# Patient Record
Sex: Female | Born: 1955 | Race: Black or African American | Hispanic: No | Smoking: Never smoker
Health system: Southern US, Community
[De-identification: ages and names within clinical notes are randomized; demographics above are authoritative.]

## PROBLEM LIST (undated history)

## (undated) DIAGNOSIS — I10 Essential (primary) hypertension: Secondary | ICD-10-CM

## (undated) DIAGNOSIS — R351 Nocturia: Secondary | ICD-10-CM

## (undated) DIAGNOSIS — K5900 Constipation, unspecified: Secondary | ICD-10-CM

## (undated) DIAGNOSIS — M1990 Unspecified osteoarthritis, unspecified site: Secondary | ICD-10-CM

## (undated) DIAGNOSIS — J45909 Unspecified asthma, uncomplicated: Secondary | ICD-10-CM

---

## 1999-10-26 ENCOUNTER — Encounter: Payer: Self-pay | Admitting: Neurosurgery

## 1999-10-28 ENCOUNTER — Observation Stay (HOSPITAL_COMMUNITY): Admission: RE | Admit: 1999-10-28 | Discharge: 1999-10-29 | Payer: Self-pay | Admitting: Neurosurgery

## 1999-10-28 ENCOUNTER — Encounter: Payer: Self-pay | Admitting: Neurosurgery

## 1999-11-24 ENCOUNTER — Encounter: Admission: RE | Admit: 1999-11-24 | Discharge: 1999-11-24 | Payer: Self-pay | Admitting: Neurosurgery

## 1999-11-24 ENCOUNTER — Encounter: Payer: Self-pay | Admitting: Neurosurgery

## 2020-09-23 ENCOUNTER — Other Ambulatory Visit: Payer: Self-pay | Admitting: Neurosurgery

## 2020-09-29 NOTE — Progress Notes (Signed)
[facility] [address], [place] - [address] [place] [address] Phone: [phone] Fax: [phone]      Your procedure is scheduled on 10/02/20.  Report to [facility] Main Entrance "A" at 05:30 A.M., and check in at the Admitting office.  Call this number if you have problems the morning of surgery:  [phone]  Call [phone] if you have any questions prior to your surgery date Monday-Friday 8am-4pm    Remember:  Do not eat or drink after midnight the night before your surgery     Take these medicines the morning of surgery with A SIP OF WATER  albuterol (VENTOLIN HFA) - if needed amLODipine (NORVASC)  budesonide-formoterol (SYMBICORT)  cetirizine (ZYRTEC) famotidine (PEPCID) fluticasone (FLONASE) gabapentin (NEURONTIN)  As of today, STOP taking any Aspirin (unless otherwise instructed by your surgeon) Aleve, Naproxen, Ibuprofen, Motrin, Advil, Goody's, BC's, all herbal medications, fish oil, and all vitamins. This includes your Excedrin.                       Do not wear jewelry, make up, or nail polish            Do not wear lotions, powders, perfumes/colognes, or deodorant.            Do not shave 48 hours prior to surgery.              Do not bring valuables to the hospital.            [facility] is not responsible for any belongings or valuables.  Do NOT Smoke (Tobacco/Vaping) or drink Alcohol 24 hours prior to your procedure If you use a CPAP at night, you may bring all equipment for your overnight stay.   Contacts, glasses, dentures or bridgework may not be worn into surgery.      For patients admitted to the hospital, discharge time will be determined by your treatment team.   Patients discharged the day of surgery will not be allowed to drive home, and someone needs to stay with them for 24 hours.    Special instructions:   [place]- Preparing For Surgery  Before surgery, you can play an important role. Because skin is  not sterile, your skin needs to be as free of germs as possible. You can reduce the number of germs on your skin by washing with CHG (chlorahexidine gluconate) Soap before surgery.  CHG is an antiseptic cleaner which kills germs and bonds with the skin to continue killing germs even after washing.    Oral Hygiene is also important to reduce your risk of infection.  Remember - BRUSH YOUR TEETH THE MORNING OF SURGERY WITH YOUR REGULAR TOOTHPASTE  Please do not use if you have an allergy to CHG or antibacterial soaps. If your skin becomes reddened/irritated stop using the CHG.  Do not shave (including legs and underarms) for at least 48 hours prior to first CHG shower. It is OK to shave your face.  Please follow these instructions carefully.   1. Shower the NIGHT BEFORE SURGERY and the MORNING OF SURGERY with CHG Soap.   2. If you chose to wash your hair, wash your hair first as usual with your normal shampoo.  3. After you shampoo, rinse your hair and body thoroughly to remove the shampoo.  4. Use CHG as you would any other liquid soap. You can apply CHG directly to the skin and wash gently with a scrungie  or a clean washcloth.   5. Apply the CHG Soap to your body ONLY FROM THE NECK DOWN.  Do not use on open wounds or open sores. Avoid contact with your eyes, ears, mouth and genitals (private parts). Wash Face and genitals (private parts)  with your normal soap.   6. Wash thoroughly, paying special attention to the area where your surgery will be performed.  7. Thoroughly rinse your body with warm water from the neck down.  8. DO NOT shower/wash with your normal soap after using and rinsing off the CHG Soap.  9. Pat yourself dry with a CLEAN TOWEL.  10. Wear CLEAN PAJAMAS to bed the night before surgery  11. Place CLEAN SHEETS on your bed the night of your first shower and DO NOT SLEEP WITH PETS.   Day of Surgery: Wear Clean/Comfortable clothing the morning of surgery Do not apply any  deodorants/lotions.   Remember to brush your teeth WITH YOUR REGULAR TOOTHPASTE.   Please read over the following fact sheets that you were given.

## 2020-09-30 ENCOUNTER — Inpatient Hospital Stay (HOSPITAL_COMMUNITY)
Admission: RE | Admit: 2020-09-30 | Discharge: 2020-09-30 | Disposition: A | Discharge status: Home / Self Care | Payer: Medicare HMO | Source: Ambulatory Visit

## 2020-09-30 ENCOUNTER — Other Ambulatory Visit (HOSPITAL_COMMUNITY)
Admission: RE | Admit: 2020-09-30 | Discharge: 2020-09-30 | Disposition: A | Discharge status: Home / Self Care | Payer: Medicare HMO | Source: Ambulatory Visit | Attending: Neurosurgery | Admitting: Neurosurgery

## 2020-09-30 NOTE — Progress Notes (Signed)
Spoke with patient this morning and informed her that surgery has been cancelled and the office will be reaching out to her.  Let patient know that she does not need to come to PAT or COVID testing appointments today.

## 2020-10-02 ENCOUNTER — Inpatient Hospital Stay (HOSPITAL_COMMUNITY): Payer: Medicare HMO

## 2020-10-02 ENCOUNTER — Ambulatory Visit (HOSPITAL_COMMUNITY): Admission: RE | Admit: 2020-10-02 | Payer: Medicare HMO | Source: Home / Self Care | Admitting: Neurosurgery

## 2020-10-02 ENCOUNTER — Encounter (HOSPITAL_COMMUNITY): Admission: RE | Payer: Self-pay | Source: Home / Self Care

## 2020-10-02 ENCOUNTER — Inpatient Hospital Stay (HOSPITAL_COMMUNITY)
Admission: EM | Admit: 2020-10-02 | Discharge: 2020-10-07 | DRG: 030 | Disposition: A | Discharge status: Rehabilitation Facility | Payer: Medicare HMO | Attending: Neurosurgery | Admitting: Neurosurgery

## 2020-10-02 DIAGNOSIS — M7989 Other specified soft tissue disorders: Secondary | ICD-10-CM | POA: Diagnosis not present

## 2020-10-02 DIAGNOSIS — R609 Edema, unspecified: Secondary | ICD-10-CM | POA: Diagnosis not present

## 2020-10-02 DIAGNOSIS — M5126 Other intervertebral disc displacement, lumbar region: Secondary | ICD-10-CM | POA: Diagnosis present

## 2020-10-02 DIAGNOSIS — R29898 Other symptoms and signs involving the musculoskeletal system: Secondary | ICD-10-CM | POA: Diagnosis present

## 2020-10-02 DIAGNOSIS — K592 Neurogenic bowel, not elsewhere classified: Secondary | ICD-10-CM | POA: Diagnosis not present

## 2020-10-02 DIAGNOSIS — Z7951 Long term (current) use of inhaled steroids: Secondary | ICD-10-CM | POA: Diagnosis not present

## 2020-10-02 DIAGNOSIS — G8222 Paraplegia, incomplete: Secondary | ICD-10-CM | POA: Diagnosis not present

## 2020-10-02 DIAGNOSIS — Z7982 Long term (current) use of aspirin: Secondary | ICD-10-CM

## 2020-10-02 DIAGNOSIS — M9689 Other intraoperative and postprocedural complications and disorders of the musculoskeletal system: Principal | ICD-10-CM | POA: Diagnosis present

## 2020-10-02 DIAGNOSIS — Y838 Other surgical procedures as the cause of abnormal reaction of the patient, or of later complication, without mention of misadventure at the time of the procedure: Secondary | ICD-10-CM | POA: Diagnosis present

## 2020-10-02 DIAGNOSIS — Z20822 Contact with and (suspected) exposure to covid-19: Secondary | ICD-10-CM | POA: Diagnosis present

## 2020-10-02 DIAGNOSIS — M21371 Foot drop, right foot: Secondary | ICD-10-CM | POA: Diagnosis present

## 2020-10-02 DIAGNOSIS — G9782 Other postprocedural complications and disorders of nervous system: Secondary | ICD-10-CM | POA: Diagnosis present

## 2020-10-02 DIAGNOSIS — M21372 Foot drop, left foot: Secondary | ICD-10-CM | POA: Diagnosis present

## 2020-10-02 DIAGNOSIS — G8918 Other acute postprocedural pain: Secondary | ICD-10-CM | POA: Diagnosis not present

## 2020-10-02 DIAGNOSIS — K219 Gastro-esophageal reflux disease without esophagitis: Secondary | ICD-10-CM | POA: Diagnosis present

## 2020-10-02 DIAGNOSIS — Z419 Encounter for procedure for purposes other than remedying health state, unspecified: Secondary | ICD-10-CM

## 2020-10-02 DIAGNOSIS — M5106 Intervertebral disc disorders with myelopathy, lumbar region: Secondary | ICD-10-CM | POA: Diagnosis not present

## 2020-10-02 DIAGNOSIS — E876 Hypokalemia: Secondary | ICD-10-CM | POA: Diagnosis not present

## 2020-10-02 DIAGNOSIS — Z888 Allergy status to other drugs, medicaments and biological substances status: Secondary | ICD-10-CM

## 2020-10-02 DIAGNOSIS — Z79899 Other long term (current) drug therapy: Secondary | ICD-10-CM

## 2020-10-02 DIAGNOSIS — N319 Neuromuscular dysfunction of bladder, unspecified: Secondary | ICD-10-CM | POA: Diagnosis not present

## 2020-10-02 DIAGNOSIS — T84028A Dislocation of other internal joint prosthesis, initial encounter: Secondary | ICD-10-CM | POA: Diagnosis present

## 2020-10-02 DIAGNOSIS — M549 Dorsalgia, unspecified: Principal | ICD-10-CM

## 2020-10-02 HISTORY — DX: Unspecified osteoarthritis, unspecified site: M19.90

## 2020-10-02 HISTORY — DX: Constipation, unspecified: K59.00

## 2020-10-02 HISTORY — DX: Essential (primary) hypertension: I10

## 2020-10-02 HISTORY — DX: Unspecified asthma, uncomplicated: J45.909

## 2020-10-02 HISTORY — DX: Nocturia: R35.1

## 2020-10-02 LAB — RESP PANEL BY RT-PCR (FLU A&B, COVID) ARPGX2
Influenza A by PCR: NEGATIVE
Influenza B by PCR: NEGATIVE
SARS Coronavirus 2 by RT PCR: NEGATIVE

## 2020-10-02 LAB — BASIC METABOLIC PANEL
Anion gap: 13 (ref 5–15)
BUN: 14 mg/dL (ref 8–23)
CO2: 26 mmol/L (ref 22–32)
Calcium: 9.2 mg/dL (ref 8.9–10.3)
Chloride: 101 mmol/L (ref 98–111)
Creatinine, Ser: 0.96 mg/dL (ref 0.44–1.00)
GFR, Estimated: >60 mL/min (ref >60)
Glucose, Bld: 189 mg/dL — ABNORMAL HIGH (ref 70–99)
Potassium: 2.9 mmol/L — ABNORMAL LOW (ref 3.5–5.1)
Sodium: 140 mmol/L (ref 135–145)

## 2020-10-02 LAB — CBC
HCT: 36.6 % (ref 36.0–46.0)
Hemoglobin: 11.8 g/dL — ABNORMAL LOW (ref 12.0–15.0)
MCH: 29.2 pg (ref 26.0–34.0)
MCHC: 32.2 g/dL (ref 30.0–36.0)
MCV: 90.6 fL (ref 80.0–100.0)
Platelets: 298 10*3/uL (ref 150–400)
RBC: 4.04 MIL/uL (ref 3.87–5.11)
RDW: 13.9 % (ref 11.5–15.5)
WBC: 6.2 10*3/uL (ref 4.0–10.5)
nRBC: 0 % (ref 0.0–0.2)

## 2020-10-02 IMAGING — MR MR LUMBAR SPINE WO/W CM
4 of 7 series · 22 of 48 positions shown · IV contrast (gadavist)
Comparison: Lumbar spine MRI [DATE]

CLINICAL DATA: Status post recent microdiscectomy. Unable to stand
following surgery. Loss of sensation left lower extremity.

EXAM:
MRI CERVICAL, THORACIC AND LUMBAR SPINE WITHOUT AND WITH CONTRAST
TECHNIQUE: Multiplanar and multiecho pulse sequences of the cervical spine, to
include the craniocervical junction and cervicothoracic junction,
and thoracic and lumbar spine, were obtained without and with
intravenous contrast.
CONTRAST:  7.5mL GADAVIST GADOBUTROL 1 MMOL/ML IV SOLN

[Series 5: T2 · sagittal · 4.0mm · 0.73mm/px · 5 of 16 slices shown (1 of 2)]
[im 1/16]
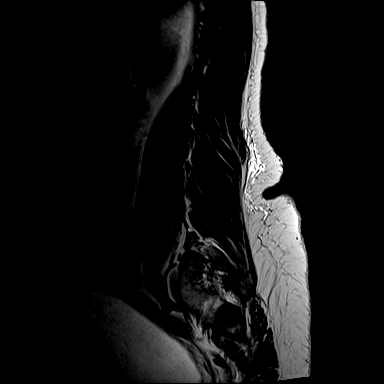
[im 4/16]
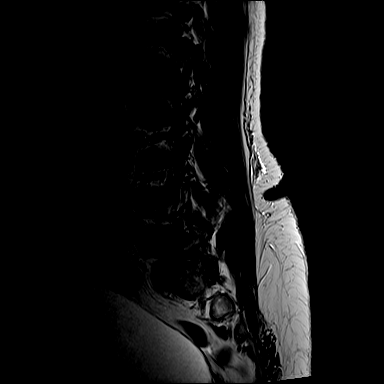
[im 8/16]
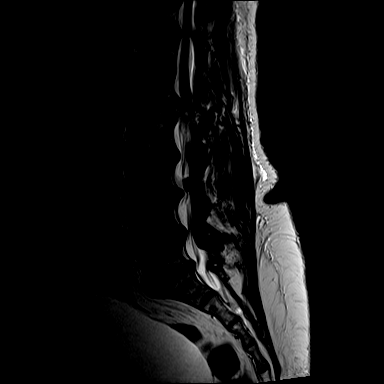
[im 12/16]
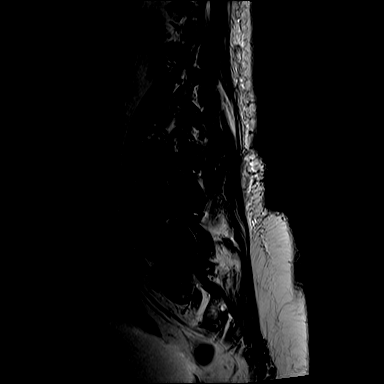
[im 16/16]
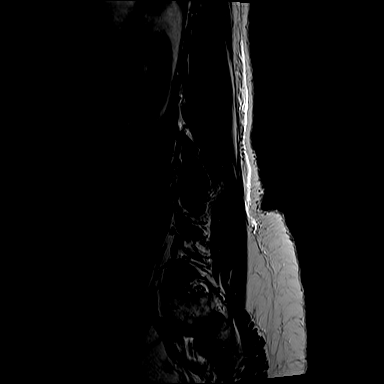

[Series 7: T1 · sagittal · 4.0mm · 0.88mm/px · 4 of 16 slices shown (1 of 2)]
[im 1/16]
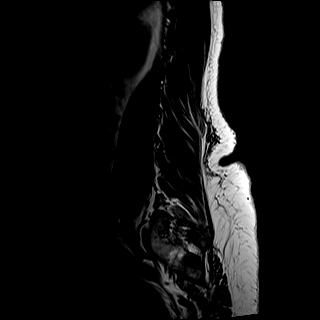
[im 6/16]
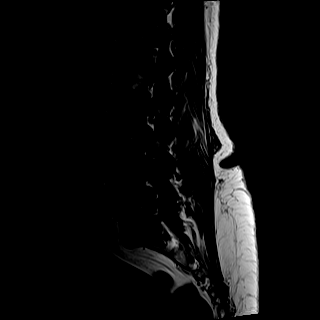
[im 11/16]
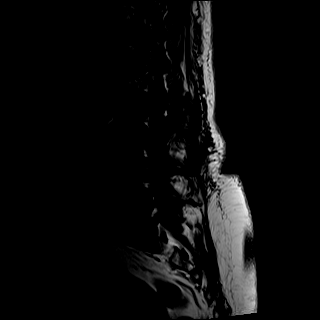
[im 16/16]
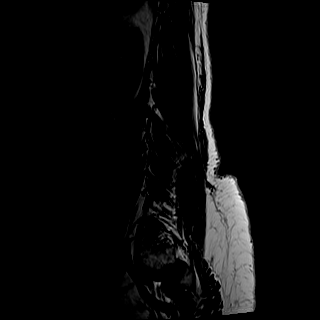

[Series 8: T2 · axial · 4.0mm · 0.57mm/px · z∈[-101,+94]mm · 8 of 37 slices shown (2 of 2)]
[im 1/37]
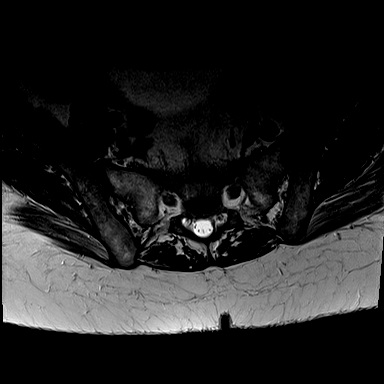
[im 5/37]
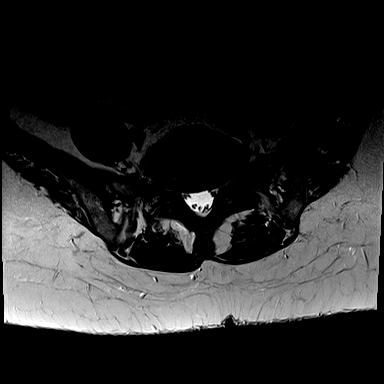
[im 13/37]
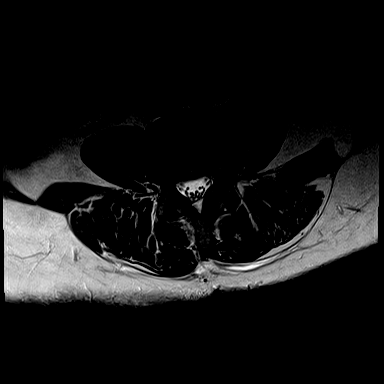
[im 17/37]
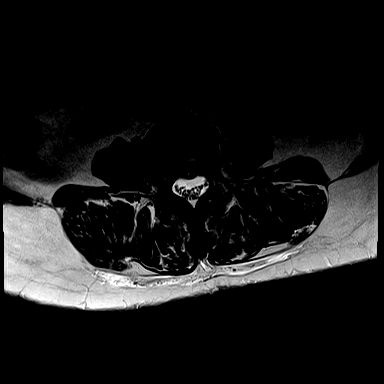
[im 21/37]
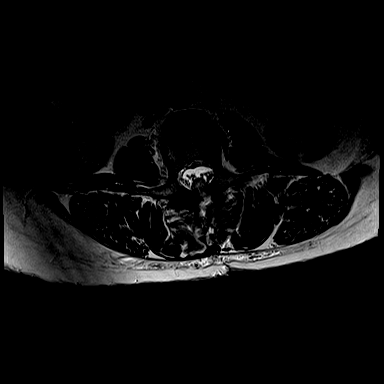
[im 25/37]
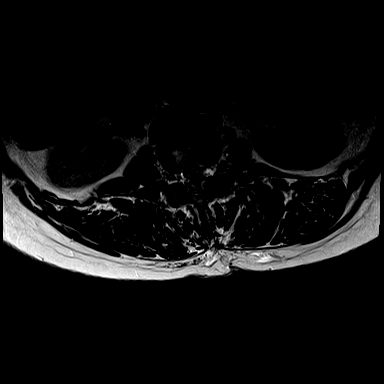
[im 33/37]
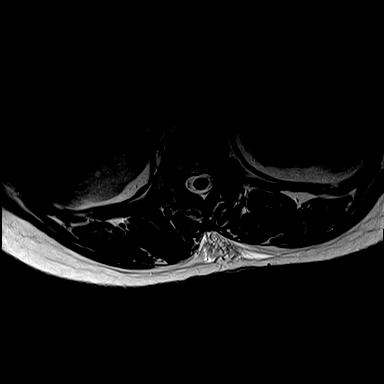
[im 37/37]
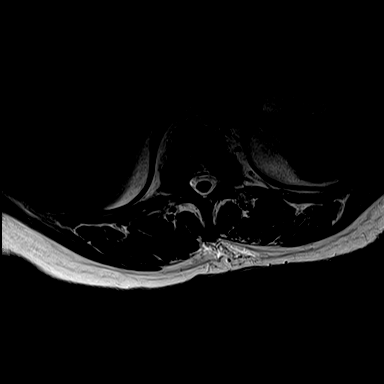

[Series 9: T1 · axial · 4.0mm · 0.34mm/px · z∈[-101,+74]mm · 5 of 37 slices shown (2 of 2)]
[im 1/37]
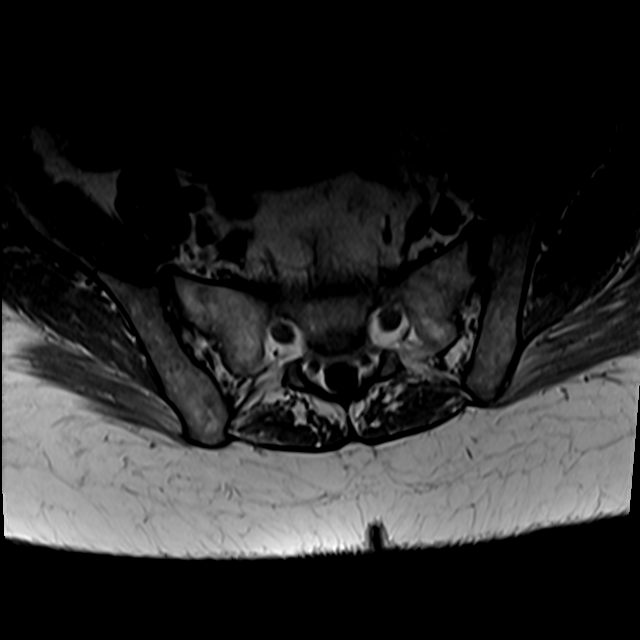
[im 5/37]
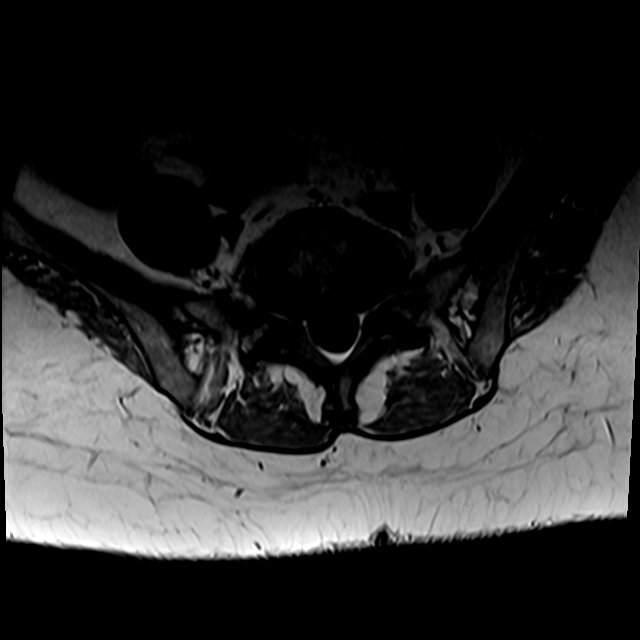
[im 13/37]
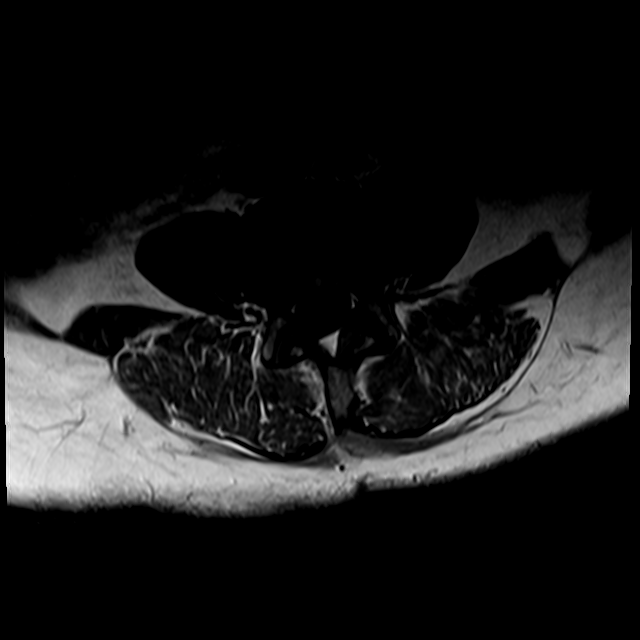
[im 21/37]
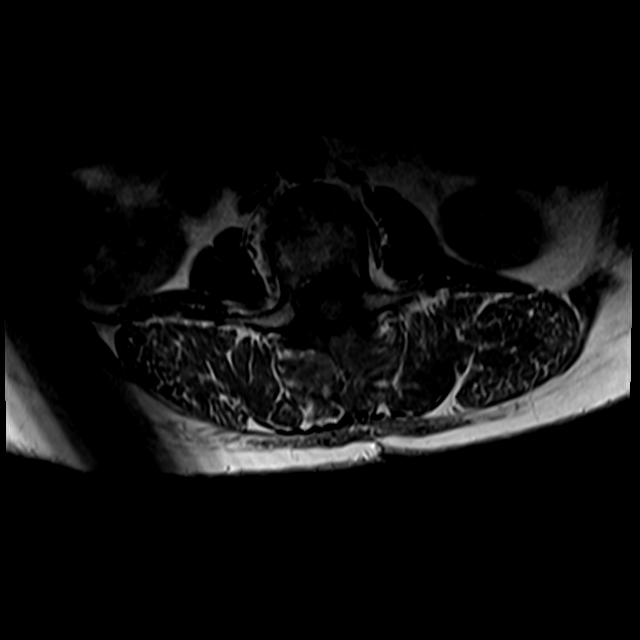
[im 33/37]
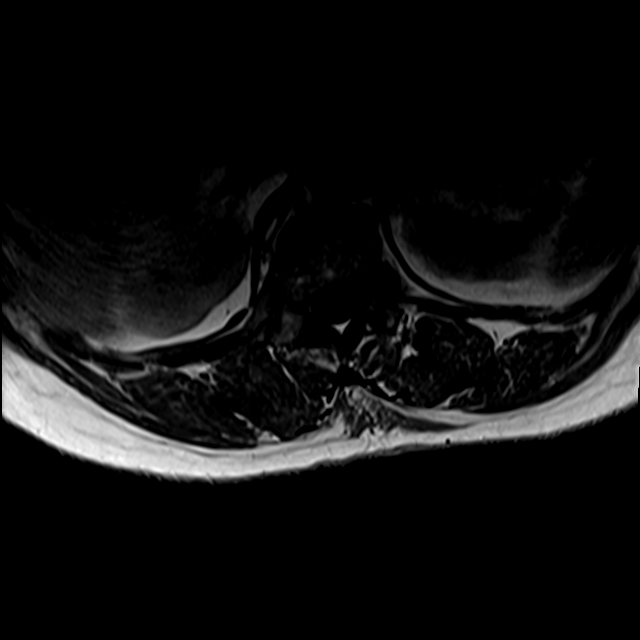

[22 of 48 positions shown; findings below may reference images not displayed]

FINDINGS: MRI CERVICAL SPINE FINDINGS

Alignment: Normal

Vertebrae: No fracture, evidence of discitis, or bone lesion.

Cord: Normal spinal cord

Posterior Fossa, vertebral arteries, paraspinal tissues: Negative

Disc levels:

C1-2: Unremarkable.

C2-3: Small central disc protrusion. Mild spinal canal stenosis. No
neural foraminal stenosis.

C3-4: Moderate left facet hypertrophy and right foraminal disc
protrusion. There is no spinal canal stenosis. Moderate bilateral
neural foraminal stenosis.

C4-5: Mild uncovertebral spurring. There is no spinal canal
stenosis. Mild bilateral neural foraminal stenosis.

C5-6: Mild uncovertebral spurring with small disc bulge. There is no
spinal canal stenosis. Mild bilateral neural foraminal stenosis.

C6-7: Normal disc space and facet joints. There is no spinal canal
stenosis. No neural foraminal stenosis.

C7-T1: Normal disc space and facet joints. There is no spinal canal
stenosis. No neural foraminal stenosis.

MRI THORACIC SPINE FINDINGS

Alignment:  Normal

Vertebrae: No acute abnormality. Mild degenerative endplate signal
changes at T10, T11 and T12.

Cord: Normal spinal cord signal and morphology. No epidural
collection.

Paraspinal and other soft tissues: Bibasilar atelectasis.

Disc levels:

Small central disc protrusions at most levels of with no stenosis
above the T11-12 level, where there is severe right foraminal
stenosis.

MRI LUMBAR SPINE FINDINGS

Segmentation:  Standard

Alignment:  Normal

Vertebrae: There is postsurgical change of bilateral laminectomies
with posterior decompression at L1-2, which is new.

Conus medullaris and cauda equina: Conus extends to the L2 level.
There is hyperintense T2-weighted signal within the conus medullaris
at the L1-2 level. There is no epidural fluid collection.

Paraspinal and other soft tissues: Dorsal postsurgical changes at
L1-2. Massively distended urinary bladder, incompletely visualized.

Disc levels:

L1-L2: Large central disc extrusion with superior migration is
unchanged, contacting the anterior aspect of the conus medullaris.
Mass effect on the spinal cord is improved owing to posterior
decompression. No neural foraminal stenosis.

L2-L3: Small disc bulge. No spinal canal stenosis. No neural
foraminal stenosis.

L3-L4: Small disc bulge. No spinal canal stenosis. Unchanged
moderate bilateral neural foraminal stenosis.

L4-L5: Intermediate sized disc bulge, unchanged. Mild facet
hypertrophy. Unchanged mild spinal canal stenosis. Worsened, now
severe left neural foraminal stenosis and unchanged mild right
neural foraminal stenosis.

L5-S1: Small disc bulge with endplate spurring and disc height loss.
No spinal canal stenosis. Unchanged severe bilateral neural
foraminal stenosis.

Visualized sacrum: Normal.

No abnormal contrast enhancement.
IMPRESSION: 1. Postsurgical changes of bilateral laminectomies at L1-2, which
are new. There is hyperintense T2-weighted signal within the conus
medullaris, which may be secondary to edema or myelomalacia.
2. No epidural hematoma.
3. Progression of left L4-5 neural foraminal stenosis, which is now
severe.
4. Unchanged severe bilateral neural foraminal stenosis at L5-S1.
5. Unchanged mild spinal canal stenosis at L4-5.
6. Massively distended urinary bladder, incompletely visualized.

## 2020-10-02 IMAGING — MR MR CERVICAL SPINE WO/W CM
5 of 8 series · 26 of 48 positions shown · IV contrast (gadavist)
Comparison: Lumbar spine MRI [DATE]

CLINICAL DATA: Status post recent microdiscectomy. Unable to stand
following surgery. Loss of sensation left lower extremity.

EXAM:
MRI CERVICAL, THORACIC AND LUMBAR SPINE WITHOUT AND WITH CONTRAST
TECHNIQUE: Multiplanar and multiecho pulse sequences of the cervical spine, to
include the craniocervical junction and cervicothoracic junction,
and thoracic and lumbar spine, were obtained without and with
intravenous contrast.
CONTRAST:  7.5mL GADAVIST GADOBUTROL 1 MMOL/ML IV SOLN

[Series 30: T2 · sagittal · 3.0mm · 0.69mm/px · 3 of 15 slices shown (1 of 2)]
[im 1/15]
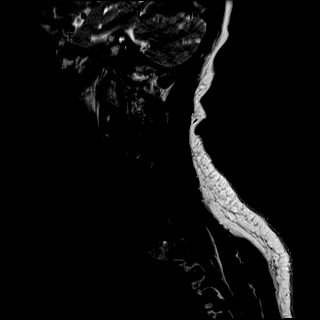
[im 8/15]
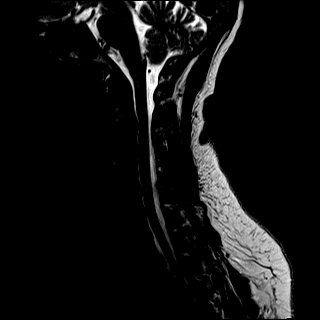
[im 15/15]
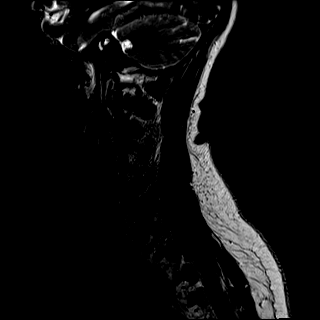

[Series 31: T1 · sagittal · 3.0mm · 0.69mm/px · 4 of 15 slices shown (1 of 2)]
[im 1/15]
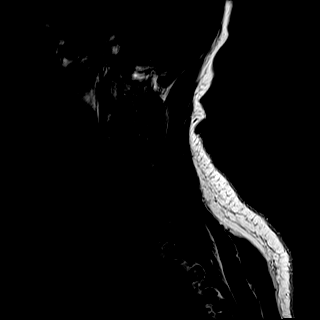
[im 5/15]
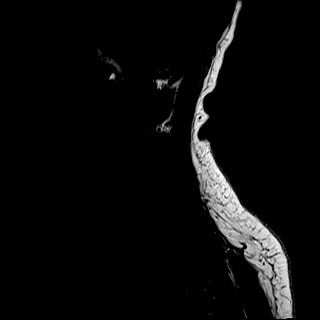
[im 10/15]
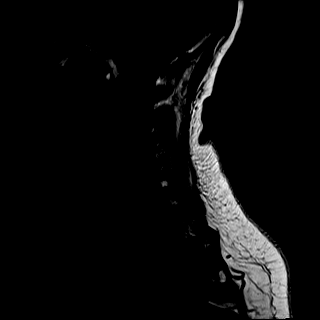
[im 15/15]
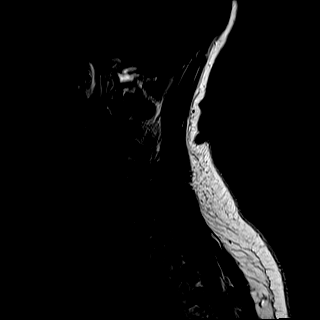

[Series 33: T2 · axial · 3.0mm · 0.66mm/px · z∈[+272,+376]mm · 8 of 33 slices shown (2 of 2)]
[im 1/33]
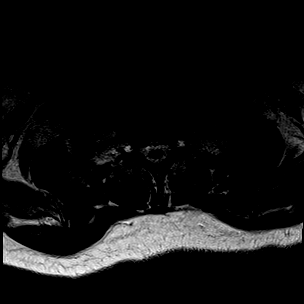
[im 5/33]
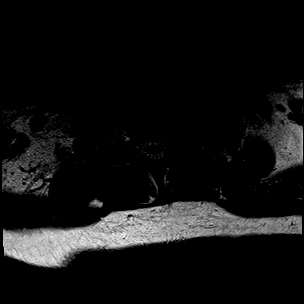
[im 10/33]
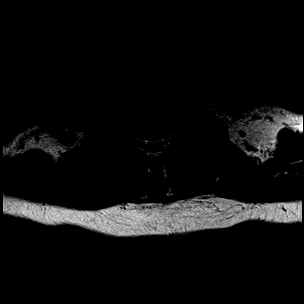
[im 14/33]
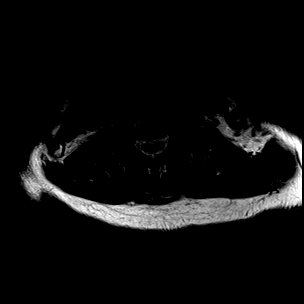
[im 19/33]
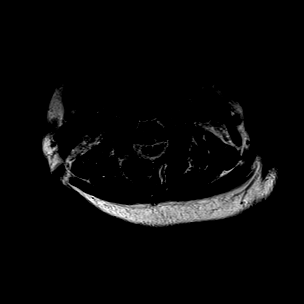
[im 23/33]
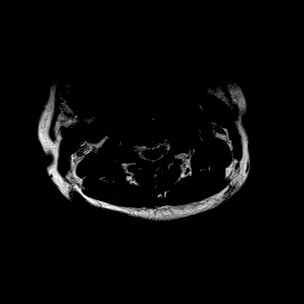
[im 28/33]
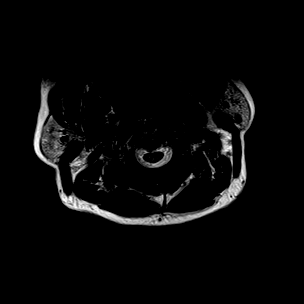
[im 33/33]
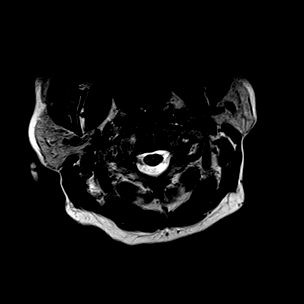

[Series 35: T1 · axial · 3.0mm · 0.39mm/px · z∈[+272,+376]mm · 8 of 33 slices shown (2 of 2)]
[im 1/33]
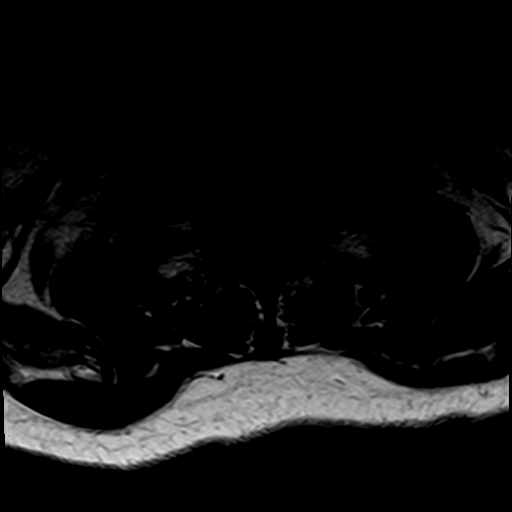
[im 5/33]
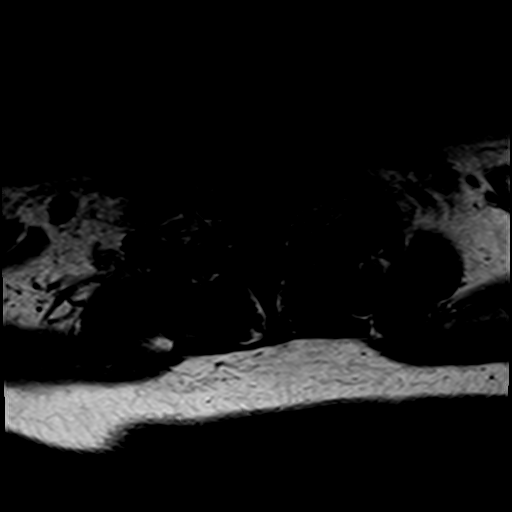
[im 10/33]
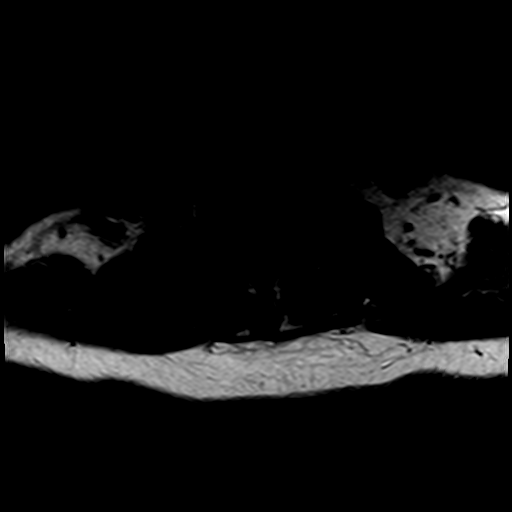
[im 14/33]
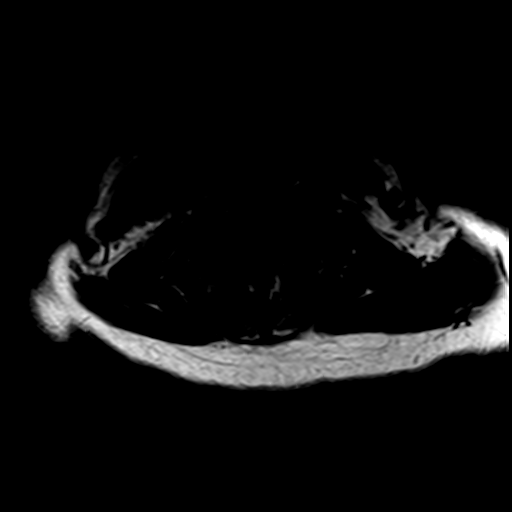
[im 19/33]
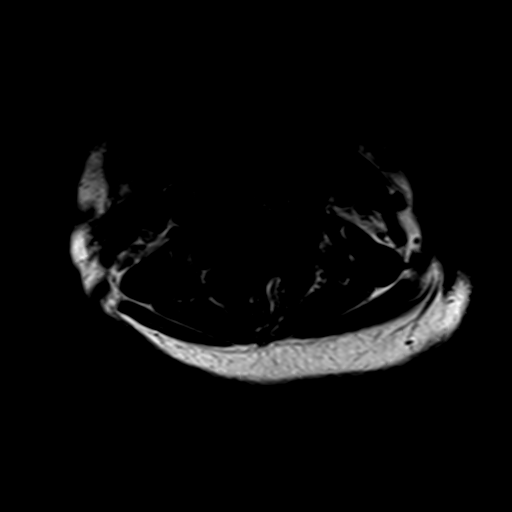
[im 23/33]
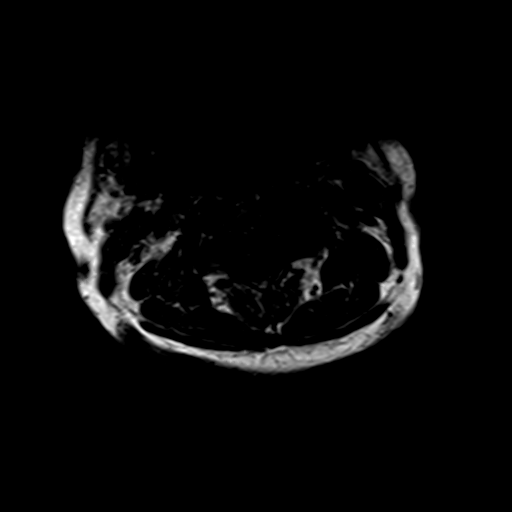
[im 28/33]
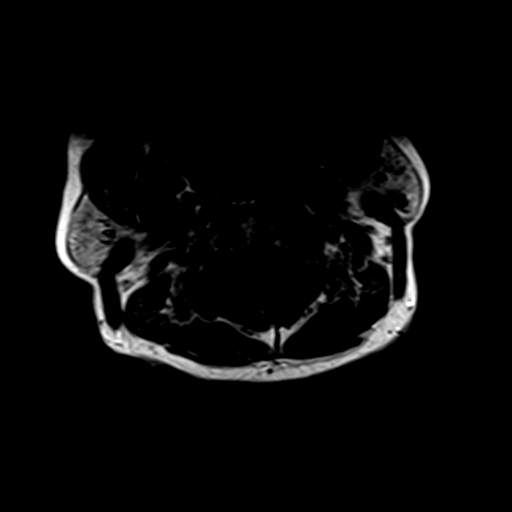
[im 33/33]
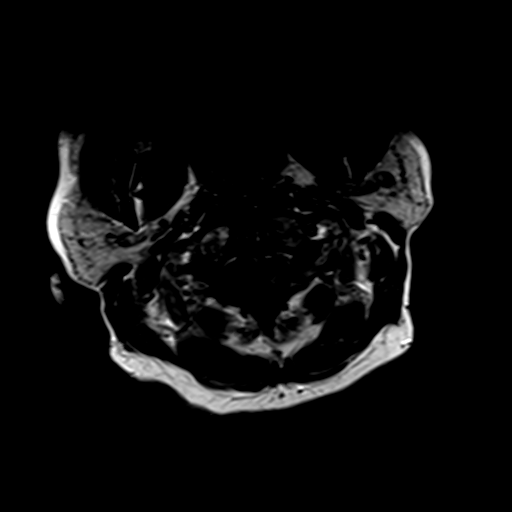

[Series 40: T1 fat-sat post-contrast · sagittal · 3.0mm · 0.43mm/px · 3 of 15 slices shown]
[im 1/15]
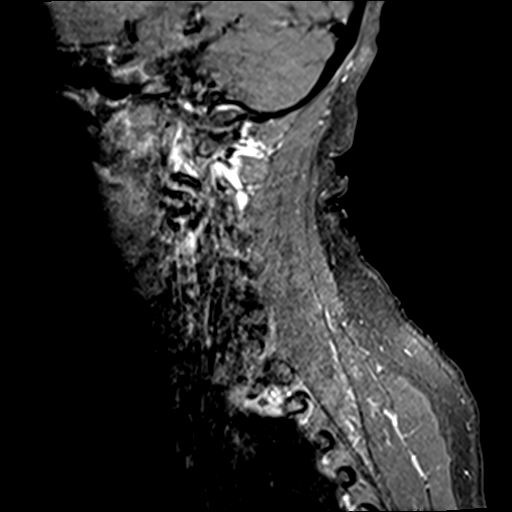
[im 5/15]
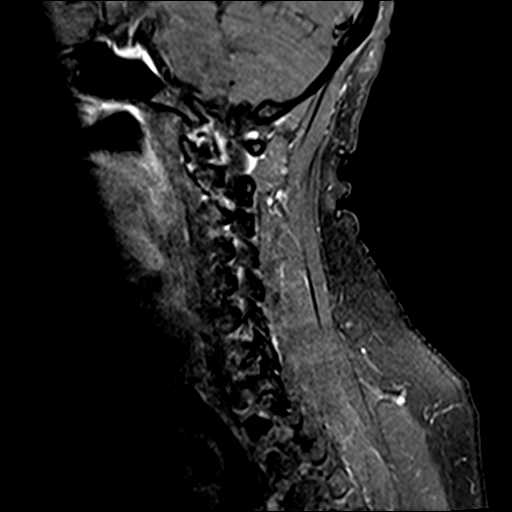
[im 10/15]
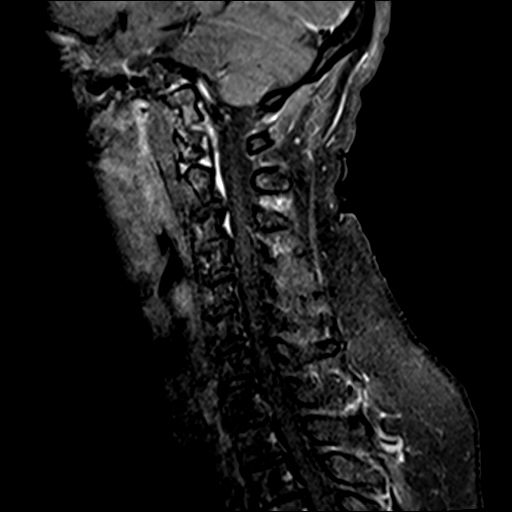

[26 of 48 positions shown; findings below may reference images not displayed]

FINDINGS: MRI CERVICAL SPINE FINDINGS

Alignment: Normal

Vertebrae: No fracture, evidence of discitis, or bone lesion.

Cord: Normal spinal cord

Posterior Fossa, vertebral arteries, paraspinal tissues: Negative

Disc levels:

C1-2: Unremarkable.

C2-3: Small central disc protrusion. Mild spinal canal stenosis. No
neural foraminal stenosis.

C3-4: Moderate left facet hypertrophy and right foraminal disc
protrusion. There is no spinal canal stenosis. Moderate bilateral
neural foraminal stenosis.

C4-5: Mild uncovertebral spurring. There is no spinal canal
stenosis. Mild bilateral neural foraminal stenosis.

C5-6: Mild uncovertebral spurring with small disc bulge. There is no
spinal canal stenosis. Mild bilateral neural foraminal stenosis.

C6-7: Normal disc space and facet joints. There is no spinal canal
stenosis. No neural foraminal stenosis.

C7-T1: Normal disc space and facet joints. There is no spinal canal
stenosis. No neural foraminal stenosis.

MRI THORACIC SPINE FINDINGS

Alignment:  Normal

Vertebrae: No acute abnormality. Mild degenerative endplate signal
changes at T10, T11 and T12.

Cord: Normal spinal cord signal and morphology. No epidural
collection.

Paraspinal and other soft tissues: Bibasilar atelectasis.

Disc levels:

Small central disc protrusions at most levels of with no stenosis
above the T11-12 level, where there is severe right foraminal
stenosis.

MRI LUMBAR SPINE FINDINGS

Segmentation:  Standard

Alignment:  Normal

Vertebrae: There is postsurgical change of bilateral laminectomies
with posterior decompression at L1-2, which is new.

Conus medullaris and cauda equina: Conus extends to the L2 level.
There is hyperintense T2-weighted signal within the conus medullaris
at the L1-2 level. There is no epidural fluid collection.

Paraspinal and other soft tissues: Dorsal postsurgical changes at
L1-2. Massively distended urinary bladder, incompletely visualized.

Disc levels:

L1-L2: Large central disc extrusion with superior migration is
unchanged, contacting the anterior aspect of the conus medullaris.
Mass effect on the spinal cord is improved owing to posterior
decompression. No neural foraminal stenosis.

L2-L3: Small disc bulge. No spinal canal stenosis. No neural
foraminal stenosis.

L3-L4: Small disc bulge. No spinal canal stenosis. Unchanged
moderate bilateral neural foraminal stenosis.

L4-L5: Intermediate sized disc bulge, unchanged. Mild facet
hypertrophy. Unchanged mild spinal canal stenosis. Worsened, now
severe left neural foraminal stenosis and unchanged mild right
neural foraminal stenosis.

L5-S1: Small disc bulge with endplate spurring and disc height loss.
No spinal canal stenosis. Unchanged severe bilateral neural
foraminal stenosis.

Visualized sacrum: Normal.

No abnormal contrast enhancement.
IMPRESSION: 1. Postsurgical changes of bilateral laminectomies at L1-2, which
are new. There is hyperintense T2-weighted signal within the conus
medullaris, which may be secondary to edema or myelomalacia.
2. No epidural hematoma.
3. Progression of left L4-5 neural foraminal stenosis, which is now
severe.
4. Unchanged severe bilateral neural foraminal stenosis at L5-S1.
5. Unchanged mild spinal canal stenosis at L4-5.
6. Massively distended urinary bladder, incompletely visualized.

## 2020-10-02 IMAGING — MR MR THORACIC SPINE WO/W CM
7 of 11 series · 25 of 48 positions shown · IV contrast (gadavist)
Comparison: Lumbar spine MRI [DATE]

CLINICAL DATA: Status post recent microdiscectomy. Unable to stand
following surgery. Loss of sensation left lower extremity.

EXAM:
MRI CERVICAL, THORACIC AND LUMBAR SPINE WITHOUT AND WITH CONTRAST
TECHNIQUE: Multiplanar and multiecho pulse sequences of the cervical spine, to
include the craniocervical junction and cervicothoracic junction,
and thoracic and lumbar spine, were obtained without and with
intravenous contrast.
CONTRAST:  7.5mL GADAVIST GADOBUTROL 1 MMOL/ML IV SOLN

[Series 21: T1 · sagittal · 5.0mm · 1.46mm/px · 1 of 9 slices shown (1 of 5)]
[im 1/9]
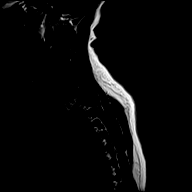

[Series 22: T1 · sagittal · 5.0mm · 1.23mm/px · 2 of 9 slices shown (2 of 5)]
[im 1/9]
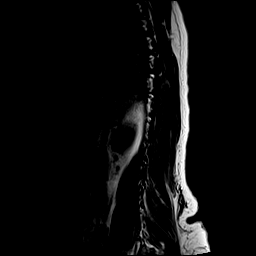
[im 9/9]
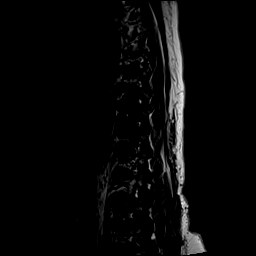

[Series 23: T1 · sagittal · 6.0mm · 1.23mm/px · 2 of 9 slices shown (3 of 5)]
[im 1/9]
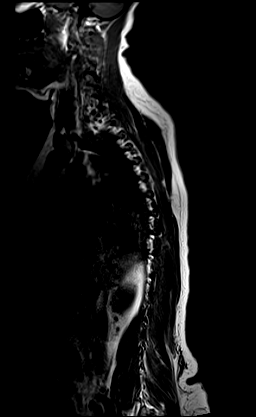
[im 9/9]
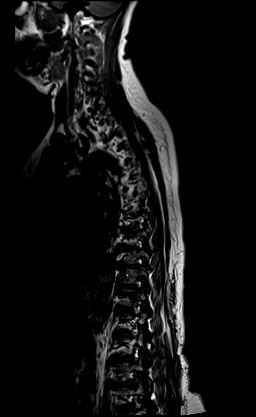

[Series 24: T2 · sagittal · 3.0mm · 0.76mm/px · 4 of 19 slices shown (1 of 2)]
[im 1/19]
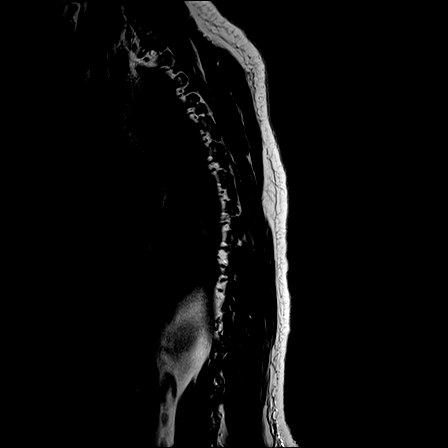
[im 7/19]
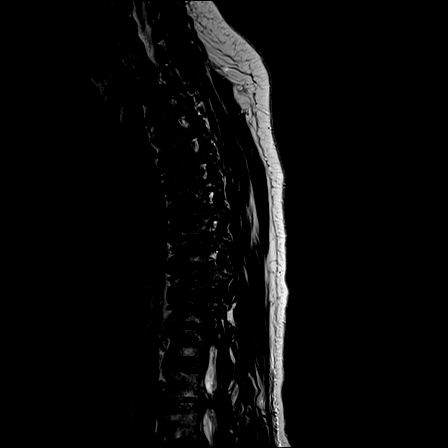
[im 13/19]
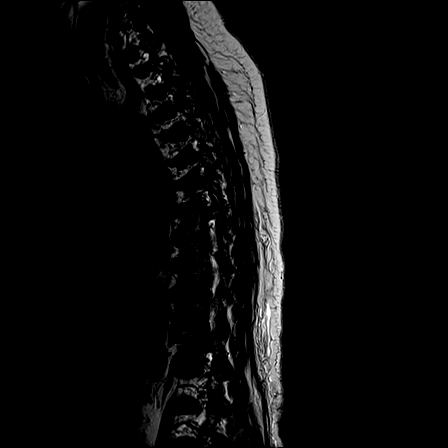
[im 19/19]
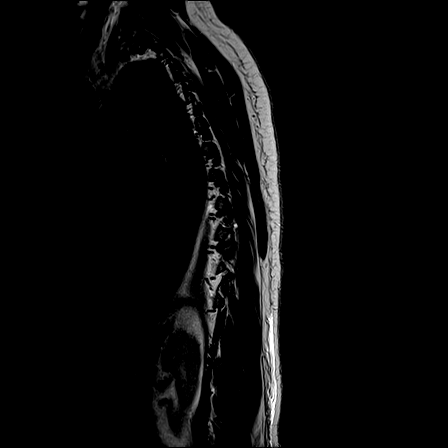

[Series 25: T1 · sagittal · 3.0mm · 0.76mm/px · 4 of 19 slices shown (4 of 5)]
[im 1/19]
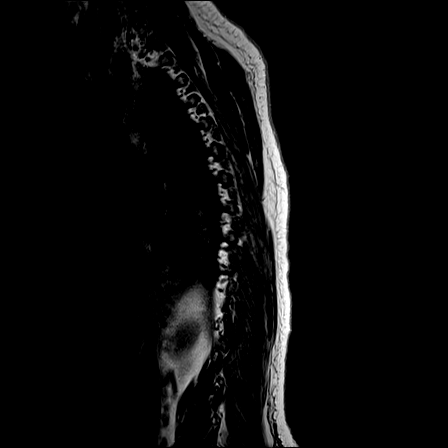
[im 7/19]
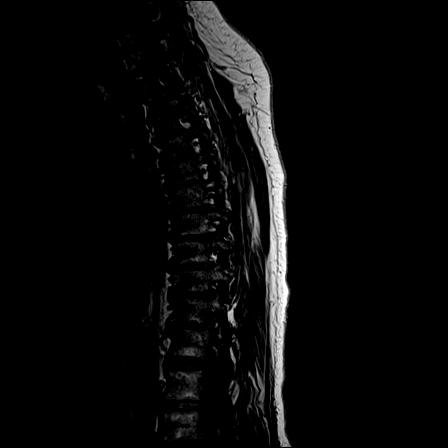
[im 13/19]
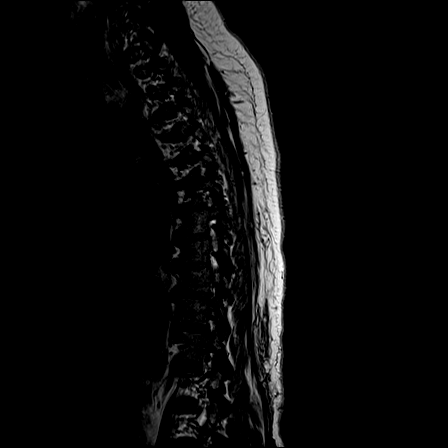
[im 19/19]
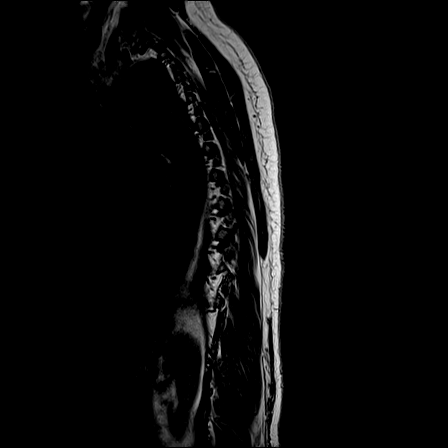

[Series 27: T2 · axial · 4.0mm · 0.59mm/px · z∈[+64,+270]mm · 7 of 39 slices shown (2 of 2)]
[im 1/39]
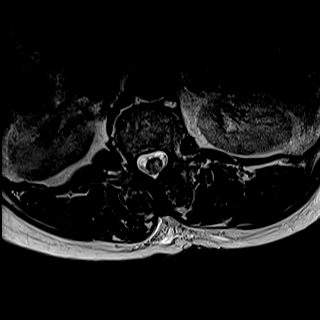
[im 7/39]
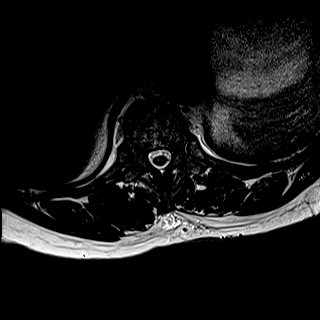
[im 13/39]
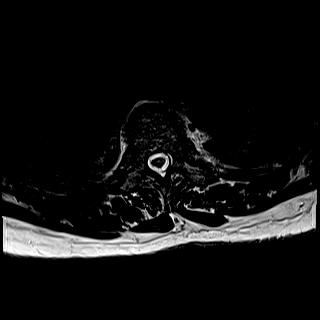
[im 20/39]
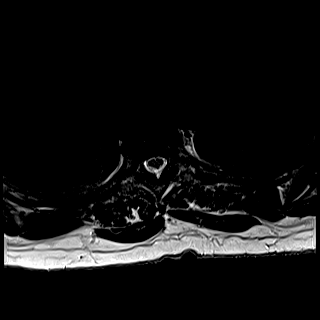
[im 26/39]
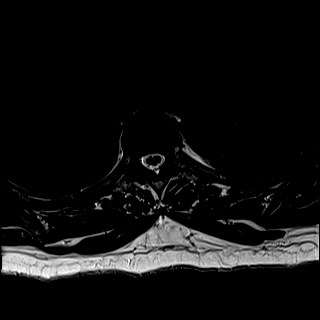
[im 32/39]
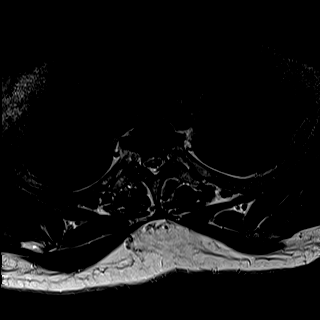
[im 39/39]
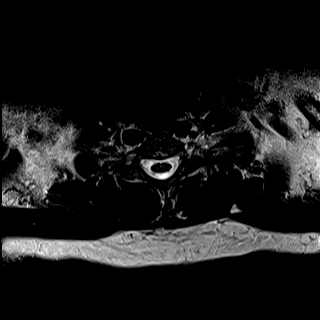

[Series 29: T1 · axial · non-contrast · 4.0mm · 0.31mm/px · z∈[+64,+201]mm · 5 of 39 slices shown (5 of 5)]
[im 1/39]
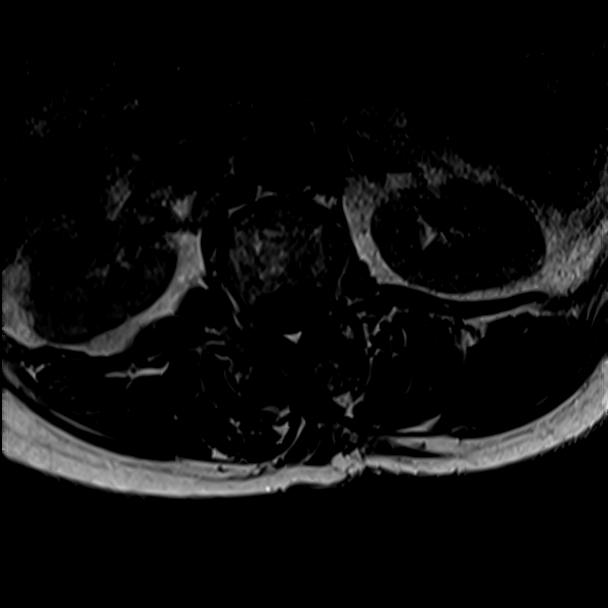
[im 7/39]
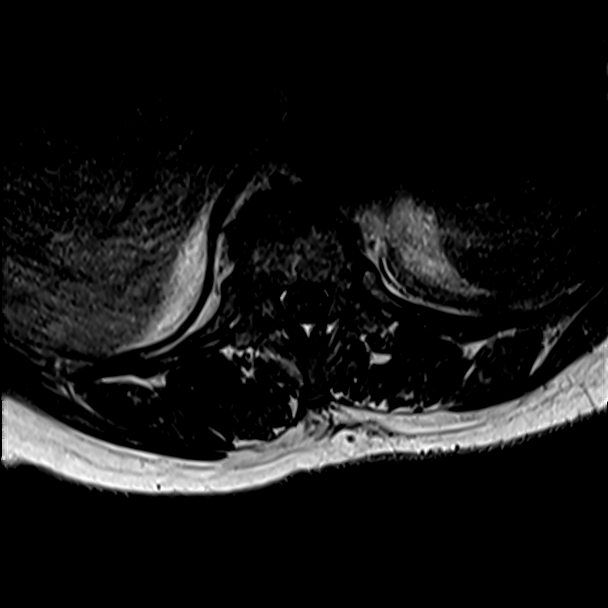
[im 13/39]
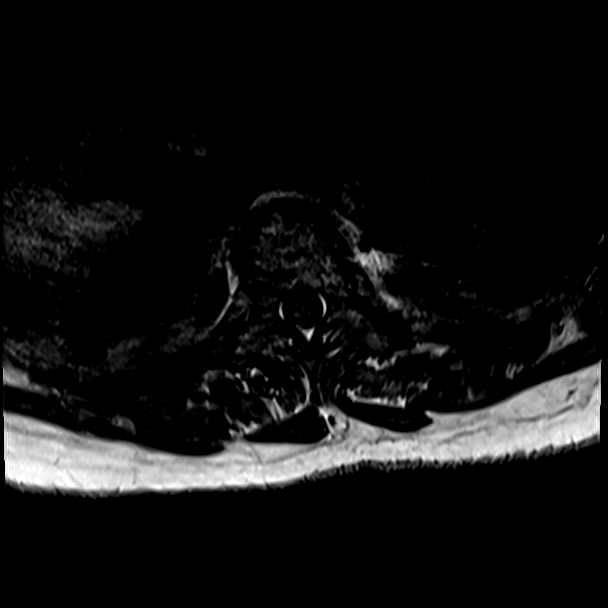
[im 20/39]
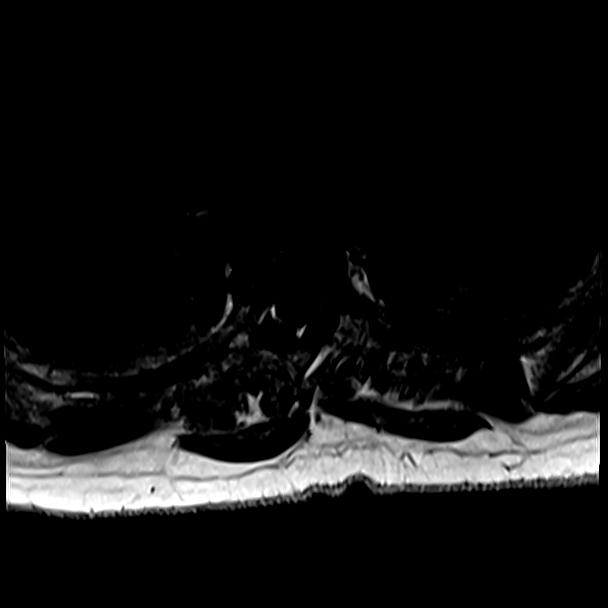
[im 26/39]
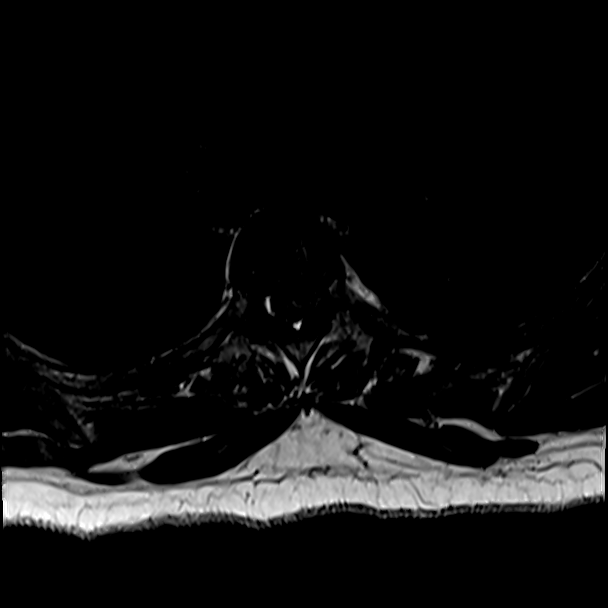

[25 of 48 positions shown; findings below may reference images not displayed]

FINDINGS: MRI CERVICAL SPINE FINDINGS

Alignment: Normal

Vertebrae: No fracture, evidence of discitis, or bone lesion.

Cord: Normal spinal cord

Posterior Fossa, vertebral arteries, paraspinal tissues: Negative

Disc levels:

C1-2: Unremarkable.

C2-3: Small central disc protrusion. Mild spinal canal stenosis. No
neural foraminal stenosis.

C3-4: Moderate left facet hypertrophy and right foraminal disc
protrusion. There is no spinal canal stenosis. Moderate bilateral
neural foraminal stenosis.

C4-5: Mild uncovertebral spurring. There is no spinal canal
stenosis. Mild bilateral neural foraminal stenosis.

C5-6: Mild uncovertebral spurring with small disc bulge. There is no
spinal canal stenosis. Mild bilateral neural foraminal stenosis.

C6-7: Normal disc space and facet joints. There is no spinal canal
stenosis. No neural foraminal stenosis.

C7-T1: Normal disc space and facet joints. There is no spinal canal
stenosis. No neural foraminal stenosis.

MRI THORACIC SPINE FINDINGS

Alignment:  Normal

Vertebrae: No acute abnormality. Mild degenerative endplate signal
changes at T10, T11 and T12.

Cord: Normal spinal cord signal and morphology. No epidural
collection.

Paraspinal and other soft tissues: Bibasilar atelectasis.

Disc levels:

Small central disc protrusions at most levels of with no stenosis
above the T11-12 level, where there is severe right foraminal
stenosis.

MRI LUMBAR SPINE FINDINGS

Segmentation:  Standard

Alignment:  Normal

Vertebrae: There is postsurgical change of bilateral laminectomies
with posterior decompression at L1-2, which is new.

Conus medullaris and cauda equina: Conus extends to the L2 level.
There is hyperintense T2-weighted signal within the conus medullaris
at the L1-2 level. There is no epidural fluid collection.

Paraspinal and other soft tissues: Dorsal postsurgical changes at
L1-2. Massively distended urinary bladder, incompletely visualized.

Disc levels:

L1-L2: Large central disc extrusion with superior migration is
unchanged, contacting the anterior aspect of the conus medullaris.
Mass effect on the spinal cord is improved owing to posterior
decompression. No neural foraminal stenosis.

L2-L3: Small disc bulge. No spinal canal stenosis. No neural
foraminal stenosis.

L3-L4: Small disc bulge. No spinal canal stenosis. Unchanged
moderate bilateral neural foraminal stenosis.

L4-L5: Intermediate sized disc bulge, unchanged. Mild facet
hypertrophy. Unchanged mild spinal canal stenosis. Worsened, now
severe left neural foraminal stenosis and unchanged mild right
neural foraminal stenosis.

L5-S1: Small disc bulge with endplate spurring and disc height loss.
No spinal canal stenosis. Unchanged severe bilateral neural
foraminal stenosis.

Visualized sacrum: Normal.

No abnormal contrast enhancement.
IMPRESSION: 1. Postsurgical changes of bilateral laminectomies at L1-2, which
are new. There is hyperintense T2-weighted signal within the conus
medullaris, which may be secondary to edema or myelomalacia.
2. No epidural hematoma.
3. Progression of left L4-5 neural foraminal stenosis, which is now
severe.
4. Unchanged severe bilateral neural foraminal stenosis at L5-S1.
5. Unchanged mild spinal canal stenosis at L4-5.
6. Massively distended urinary bladder, incompletely visualized.

## 2020-10-02 SURGERY — LUMBAR LAMINECTOMY/DECOMPRESSION MICRODISCECTOMY 1 LEVEL
Anesthesia: General | Laterality: Bilateral

## 2020-10-02 MED ORDER — HYDROCODONE-ACETAMINOPHEN 5-325 MG PO TABS
1.0000 | ORAL_TABLET | ORAL | Status: DC | PRN
  Administered 2020-10-02 – 2020-10-03 (×2): 1 via ORAL
  Filled 2020-10-02 (×2): qty 1

## 2020-10-02 MED ORDER — DOCUSATE SODIUM 100 MG PO CAPS
100.0000 mg | ORAL_CAPSULE | Freq: Two times a day (BID) | ORAL | Status: DC
  Administered 2020-10-03 – 2020-10-07 (×9): 100 mg via ORAL
  Filled 2020-10-02 (×9): qty 1

## 2020-10-02 MED ORDER — SENNA 8.6 MG PO TABS
1.0000 | ORAL_TABLET | Freq: Two times a day (BID) | ORAL | Status: DC
  Administered 2020-10-03 – 2020-10-06 (×8): 8.6 mg via ORAL
  Filled 2020-10-02 (×8): qty 1

## 2020-10-02 MED ORDER — OXYCODONE HCL 5 MG PO TABS: 5.0000 mg | ORAL_TABLET | ORAL | Status: DC | PRN

## 2020-10-02 MED ORDER — ONDANSETRON HCL 4 MG/2ML IJ SOLN: 4.0000 mg | Freq: Four times a day (QID) | INTRAMUSCULAR | Status: DC | PRN

## 2020-10-02 MED ORDER — LORATADINE 10 MG PO TABS: 10.0000 mg | ORAL_TABLET | Freq: Every day | ORAL | Status: DC | PRN

## 2020-10-02 MED ORDER — HYDROMORPHONE HCL 1 MG/ML IJ SOLN
0.5000 mg | INTRAMUSCULAR | Status: DC | PRN
  Administered 2020-10-02 – 2020-10-03 (×2): 1 mg via INTRAVENOUS
  Filled 2020-10-02 (×2): qty 1

## 2020-10-02 MED ORDER — FLUTICASONE PROPIONATE 50 MCG/ACT NA SUSP
2.0000 | Freq: Every day | NASAL | Status: DC | PRN
  Filled 2020-10-02: qty 16

## 2020-10-02 MED ORDER — PHENOL 1.4 % MT LIQD: 1.0000 | OROMUCOSAL | Status: DC | PRN

## 2020-10-02 MED ORDER — POLYETHYLENE GLYCOL 3350 17 G PO PACK
17.0000 g | PACK | Freq: Every day | ORAL | Status: DC
  Administered 2020-10-04 – 2020-10-06 (×3): 17 g via ORAL
  Filled 2020-10-02 (×3): qty 1

## 2020-10-02 MED ORDER — MENTHOL 3 MG MT LOZG: 1.0000 | LOZENGE | OROMUCOSAL | Status: DC | PRN

## 2020-10-02 MED ORDER — ACETAMINOPHEN 325 MG PO TABS: 650.0000 mg | ORAL_TABLET | ORAL | Status: DC | PRN

## 2020-10-02 MED ORDER — AMLODIPINE BESYLATE 5 MG PO TABS
10.0000 mg | ORAL_TABLET | Freq: Every day | ORAL | Status: DC
  Administered 2020-10-03 – 2020-10-07 (×5): 10 mg via ORAL
  Filled 2020-10-02 (×5): qty 2

## 2020-10-02 MED ORDER — FLEET ENEMA 7-19 GM/118ML RE ENEM: 1.0000 | ENEMA | Freq: Once | RECTAL | Status: DC | PRN

## 2020-10-02 MED ORDER — ACETAMINOPHEN 650 MG RE SUPP: 650.0000 mg | RECTAL | Status: DC | PRN

## 2020-10-02 MED ORDER — METHOCARBAMOL 500 MG PO TABS
500.0000 mg | ORAL_TABLET | Freq: Four times a day (QID) | ORAL | Status: DC | PRN
  Administered 2020-10-03 – 2020-10-07 (×7): 500 mg via ORAL
  Filled 2020-10-02 (×7): qty 1

## 2020-10-02 MED ORDER — POTASSIUM CHLORIDE CRYS ER 20 MEQ PO TBCR
40.0000 meq | EXTENDED_RELEASE_TABLET | Freq: Two times a day (BID) | ORAL | Status: DC
  Administered 2020-10-03 – 2020-10-07 (×8): 40 meq via ORAL
  Filled 2020-10-02 (×9): qty 2

## 2020-10-02 MED ORDER — SODIUM CHLORIDE 0.9 % IV SOLN: INTRAVENOUS | Status: DC

## 2020-10-02 MED ORDER — ACETAMINOPHEN 500 MG PO TABS
1000.0000 mg | ORAL_TABLET | Freq: Four times a day (QID) | ORAL | Status: AC
  Administered 2020-10-02 – 2020-10-03 (×2): 1000 mg via ORAL
  Filled 2020-10-02 (×2): qty 2

## 2020-10-02 MED ORDER — METHOCARBAMOL 1000 MG/10ML IJ SOLN
500.0000 mg | Freq: Four times a day (QID) | INTRAVENOUS | Status: DC | PRN
  Filled 2020-10-02 (×2): qty 5

## 2020-10-02 MED ORDER — SENNOSIDES-DOCUSATE SODIUM 8.6-50 MG PO TABS: 1.0000 | ORAL_TABLET | Freq: Every evening | ORAL | Status: DC | PRN

## 2020-10-02 MED ORDER — BISACODYL 10 MG RE SUPP
10.0000 mg | Freq: Every day | RECTAL | Status: DC | PRN
Start: 2020-10-02 — End: 2020-10-07
  Administered 2020-10-06: 10 mg via RECTAL
  Filled 2020-10-02: qty 1

## 2020-10-02 MED ORDER — MOMETASONE FURO-FORMOTEROL FUM 200-5 MCG/ACT IN AERO
2.0000 | INHALATION_SPRAY | Freq: Two times a day (BID) | RESPIRATORY_TRACT | Status: DC
  Administered 2020-10-04 – 2020-10-07 (×6): 2 via RESPIRATORY_TRACT
  Filled 2020-10-02 (×2): qty 8.8

## 2020-10-02 MED ORDER — FAMOTIDINE 20 MG PO TABS
20.0000 mg | ORAL_TABLET | Freq: Two times a day (BID) | ORAL | Status: DC
  Administered 2020-10-03 – 2020-10-07 (×9): 20 mg via ORAL
  Filled 2020-10-02 (×9): qty 1

## 2020-10-02 MED ORDER — GADOBUTROL 1 MMOL/ML IV SOLN
7.5000 mL | Freq: Once | INTRAVENOUS | Status: AC | PRN
  Administered 2020-10-02: 7.5 mL via INTRAVENOUS

## 2020-10-02 MED ORDER — ZOLPIDEM TARTRATE 5 MG PO TABS
5.0000 mg | ORAL_TABLET | Freq: Every day | ORAL | Status: DC
  Administered 2020-10-04 – 2020-10-06 (×3): 5 mg via ORAL
  Filled 2020-10-02 (×3): qty 1

## 2020-10-02 MED ORDER — GABAPENTIN 300 MG PO CAPS
600.0000 mg | ORAL_CAPSULE | Freq: Two times a day (BID) | ORAL | Status: DC
  Administered 2020-10-03 – 2020-10-04 (×3): 600 mg via ORAL
  Filled 2020-10-02 (×3): qty 2

## 2020-10-02 MED ORDER — ONDANSETRON HCL 4 MG PO TABS: 4.0000 mg | ORAL_TABLET | Freq: Four times a day (QID) | ORAL | Status: DC | PRN

## 2020-10-02 NOTE — ED Triage Notes (Signed)
Pt transferred from [facility] specialty surgical center due to no feeling below the knee after back surgery on lumbar 1 & 2 today. Pt was in recovery process when they found that she was unable to stand. Pt reports shooting pain above the knees as well and back pain where incision for surgery was.   Vitals: NSR BP: 140/86  22G in right hand

## 2020-10-02 NOTE — ED Provider Notes (Signed)
[facility] EMERGENCY DEPARTMENT Provider Note   CSN: [id number] Arrival date & time: 10/02/20  1828     History No chief complaint on file.   Mary Davila is a 65 y.o. female.  HPI   65 year old female presents the emergency department from surgical center for neurosurgical evaluation. Patient had just had a bilateral lumbar microdiscectomy with Dr. Franky Macho.  In the recovery room it is reported that she was unable to stand and had loss of sensation on the left lower extremity.  She was referred here for evaluation and admission.  No past medical history on file.  There are no problems to display for this patient.      OB History   No obstetric history on file.     No family history on file.     Home Medications Prior to Admission medications   Medication Sig Start Date End Date Taking? Authorizing Provider  albuterol (VENTOLIN HFA) 108 (90 Base) MCG/ACT inhaler Inhale 2 puffs into the lungs every 6 (six) hours as needed for wheezing or shortness of breath.    [provider]  amLODipine (NORVASC) 10 MG tablet Take 10 mg by mouth daily.    [provider]  aspirin-acetaminophen-caffeine (EXCEDRIN EXTRA STRENGTH) [phone] MG tablet Take 2 tablets by mouth 2 (two) times daily.    [provider]  budesonide-formoterol (SYMBICORT) 160-4.5 MCG/ACT inhaler Inhale 2 puffs into the lungs 2 (two) times daily.    [provider]  cetirizine (ZYRTEC) 10 MG tablet Take 10 mg by mouth daily.    [provider]  Diclofenac Sodium CR 100 MG 24 hr tablet Take 100 mg by mouth daily.    [provider]  famotidine (PEPCID) 20 MG tablet Take 20 mg by mouth 2 (two) times daily.    [provider]  fluticasone (FLONASE) 50 MCG/ACT nasal spray Place 2 sprays into both nostrils daily.    [provider]  gabapentin (NEURONTIN) 300 MG capsule Take 600 mg by mouth 2 (two) times daily.    [provider]  polyethylene glycol (MIRALAX / GLYCOLAX) 17 g packet Take 17 g by mouth daily.    [provider]  potassium chloride SA (KLOR-CON) 20 MEQ tablet Take 40 mEq by mouth 2 (two) times daily. 08/12/20   [provider]  zolpidem (AMBIEN) 5 MG tablet Take 5 mg by mouth at bedtime.    [provider]    Allergies    Celebrex [celecoxib], Lisinopril, Meloxicam, Metronidazole, Nabumetone, Nebivolol, and Tramadol  Review of Systems   Review of Systems  Constitutional: Negative for chills and fever.  HENT: Negative for congestion.   Eyes: Negative for visual disturbance.  Respiratory: Negative for shortness of breath.   Cardiovascular: Negative for chest pain.  Gastrointestinal: Negative for abdominal pain, diarrhea and vomiting.  Genitourinary: Negative for dysuria.  Musculoskeletal: Positive for back pain.  Skin: Negative for rash.  Neurological: Positive for weakness and numbness. Negative for headaches.    Physical Exam Updated Vital Signs BP 136/69 (BP Location: Left Arm)   Pulse 87   Temp 98.7 F (37.1 C) (Oral)   Resp 16   SpO2 97%   Physical Exam Vitals and nursing note reviewed.  Constitutional:      Appearance: Normal appearance.  HENT:     Head: Normocephalic.     Mouth/Throat:     Mouth: Mucous membranes are moist.  Cardiovascular:     Rate and Rhythm: Normal rate.  Pulmonary:     Effort: Pulmonary effort is normal. No respiratory distress.  Abdominal:     Palpations: Abdomen is soft.     Tenderness: There is no abdominal tenderness.  Skin:    General: Skin is warm.  Neurological:     Mental Status: She is alert and oriented to person, place, and time. Mental status is at baseline.     Comments: LLE weakness and decreased sensation   Psychiatric:        Mood and Affect: Mood normal.     ED Results / Procedures / Treatments   Labs (all labs ordered are listed, but only abnormal results are displayed) Labs Reviewed  - No data to display  EKG None  Radiology No results found.  Procedures Procedures (including critical care time)  Medications Ordered in ED Medications - No data to display  ED Course  I have reviewed the triage vital signs and the nursing notes.  Pertinent labs & imaging results that were available during my care of the patient were reviewed by me and considered in my medical decision making (see chart for details).    MDM Rules/Calculators/A&P                          65 year old female presents emergency department from surgery center after being postoperative lumbar microdiscectomy.  In the recovery room she was noted to have profound weakness left lower extremity, being unable to stand with loss of sensation.  She was referred here for further evaluation.  Neurosurgery is aware, they have evaluated in the emergency department and will admit.  They are ordering imaging studies.  Vital signs are stable.  Patients evaluation and results requires admission for further treatment and care. Patient agrees with admission plan, offers no new complaints and is stable/unchanged at time of admit. Final Clinical Impression(s) / ED Diagnoses Final diagnoses:  None    Rx / DC Orders ED Discharge Orders    None       Rozelle Logan, DO 10/02/20 1952

## 2020-10-02 NOTE — ED Notes (Signed)
Pt transported to MRI 

## 2020-10-02 NOTE — H&P (Signed)
Chief Complaint   No chief complaint on file.   HPI   Consult requested by: Dr Wilkie Aye, EDP [facility] Reason for consult: Post op problem  HPI: Mary Davila is a 65 y.o. female who underwent bilateral L1-L2 microdiscectomy earlier today at the outpatient surgical center who developed rather pronounced BLE weakness and new numbness/tingling involving the RLE. Because of the severity of her symptoms, the decision was made to bring her to [facility] for admission and further work up of her symptoms. Patient is a rather poor history and trying to get a thorough history is quite difficult. Pre op she reports have severe primarily left leg pain and bilateral foot weakness. Post op, new distal RLE pain, N/T and inability to move BLE. Was using a cane prior to surgery for assistance with ambulation, now unable to stand.  Patient Active Problem List   Diagnosis Date Noted  . Lower extremity weakness 10/02/2020    PMH: No past medical history on file.  PSH:  SH:    MEDS: Prior to Admission medications   Medication Sig Start Date End Date Taking? Authorizing Provider  albuterol (VENTOLIN HFA) 108 (90 Base) MCG/ACT inhaler Inhale 2 puffs into the lungs every 6 (six) hours as needed for wheezing or shortness of breath.    [provider]  amLODipine (NORVASC) 10 MG tablet Take 10 mg by mouth daily.    [provider]  aspirin-acetaminophen-caffeine (EXCEDRIN EXTRA STRENGTH) [phone] MG tablet Take 2 tablets by mouth 2 (two) times daily.    [provider]  budesonide-formoterol (SYMBICORT) 160-4.5 MCG/ACT inhaler Inhale 2 puffs into the lungs 2 (two) times daily.    [provider]  cetirizine (ZYRTEC) 10 MG tablet Take 10 mg by mouth daily.    [provider]  Diclofenac Sodium CR 100 MG 24 hr tablet Take 100 mg by mouth daily.    [provider]  famotidine (PEPCID) 20 MG tablet Take 20 mg by mouth 2 (two) times daily.    [provider]   fluticasone (FLONASE) 50 MCG/ACT nasal spray Place 2 sprays into both nostrils daily.    [provider]  gabapentin (NEURONTIN) 300 MG capsule Take 600 mg by mouth 2 (two) times daily.    [provider]  polyethylene glycol (MIRALAX / GLYCOLAX) 17 g packet Take 17 g by mouth daily.    [provider]  potassium chloride SA (KLOR-CON) 20 MEQ tablet Take 40 mEq by mouth 2 (two) times daily. 08/12/20   [provider]  zolpidem (AMBIEN) 5 MG tablet Take 5 mg by mouth at bedtime.    [provider]    ALLERGY: Allergies  Allergen Reactions  . Celebrex [Celecoxib]     Caused pain  . Lisinopril Swelling    Facial swelling  . Meloxicam Swelling    Facial swelling  . Metronidazole     Unknown reaction  . Nabumetone     Unknown reaction  . Nebivolol     Unknown reaction  . Tramadol     Caused pain     Social History   Tobacco Use  . Smoking status: Not on file  . Smokeless tobacco: Not on file  Substance Use Topics  . Alcohol use: Not on file     No family history on file.   ROS   Review of Systems  Constitutional: Negative.   HENT: Negative.   Eyes: Negative.   Respiratory: Negative.   Cardiovascular: Negative.   Gastrointestinal: Negative.  Genitourinary: Negative.   Musculoskeletal: Positive for back pain, myalgias and neck pain.  Skin: Negative.   Neurological: Positive for sensory change (RLE) and weakness (BLE). Negative for dizziness, tingling, tremors, speech change, seizures, loss of consciousness and headaches.    Exam   Vitals:   10/02/20 1916  BP: 136/69  Pulse: 87  Resp: 16  Temp: 98.7 F (37.1 C)  SpO2: 97%   General appearance: WDWN, NAD Eyes: No scleral injection Cardiovascular: Regular rate and rhythm without murmurs, rubs, gallops. No edema or variciosities. Distal pulses normal. Pulmonary: Effort normal, non-labored breathing Musculoskeletal:     Muscle tone upper extremities: Normal     Muscle tone lower extremities: Normal    Motor exam:  Upper Extremities Deltoid Bicep Tricep Grip  Right 5/5 5/5 5/5 5/5  Left 5/5 5/5 5/5 5/5   Lower Extremity IP Quad PF DF EHL  Right Unable to assess, severe pain to touch, no visible movement Unable to assess, severe pain to touch, no visible movement 3/5 1/5 1/5  Left 2/5 2/5 1/5 0/5 0/5  Inconsistent exam at times. Certainly weak in BLE. True weakness difficult to ascertain  Neurological Mental Status:    - Patient is awake, alert, oriented to person, place, month, year, and situation    - Patient is able to give a clear and coherent history.    - No signs of aphasia or neglect Cranial Nerves    - II: Visual Fields are full. PERRL    - III/IV/VI: EOMI without ptosis or diploplia.     - V: Facial sensation is grossly normal    - VII: Facial movement is symmetric.     - VIII: hearing is intact to voice    - X: Uvula elevates symmetrically    - XI: Shoulder shrug is symmetric.    - XII: tongue is midline without atrophy or fasciculations.  Sensory: Sensation grossly intact to LT in all extremities. RLE with severe dyskithesias from knee to foot. Deep Tendon Reflexes: no patellar reflexes, 3+ bicep, brachioradialis. Slightly positive hoffmans left, prominent left pectoralis reflex  Results - Imaging/Labs   No results found for this or any previous visit (from the past 48 hour(s)).  No results found.  Impression/Plan   65 y.o. female POD0 L1-2 bilateral microdiscectomy with new BLE weakness and distal RLE pain and paresthesias. Deficits noted as above, although inconsistent at times. Does have rather concerning exam findings (prominent pectoralis, hoffmans). Certainly needs MRI L spine due to recent surgery, but will include C/T spine given exam findings.  Dr Franky Macho will be made aware of patient's admission.  Cindra Presume, PA-C [facility]

## 2020-10-03 ENCOUNTER — Inpatient Hospital Stay (HOSPITAL_COMMUNITY): Payer: Medicare HMO | Admitting: Anesthesiology

## 2020-10-03 ENCOUNTER — Inpatient Hospital Stay (HOSPITAL_COMMUNITY): Payer: Medicare HMO

## 2020-10-03 ENCOUNTER — Inpatient Hospital Stay (HOSPITAL_COMMUNITY)
Admission: EM | Disposition: A | Discharge status: Rehabilitation Facility | Payer: Self-pay | Source: Home / Self Care | Attending: Neurosurgery

## 2020-10-03 LAB — SURGICAL PCR SCREEN
MRSA, PCR: NEGATIVE
Staphylococcus aureus: NEGATIVE

## 2020-10-03 IMAGING — RF DG LUMBAR SPINE 1V
1 series · 1 of 1 positions shown · IV contrast (agent unspecified)
Comparison: Total spine MRI [DATE]. intraoperative lumbar
radiographs [DATE].

CLINICAL DATA: 64-year-old female undergoing L1-L2 decompression
and fusion.

EXAM:
DG C-ARM 1-60 MIN; LUMBAR SPINE - 1 VIEW
CONTRAST:  None.
FLUOROSCOPY TIME:  Fluoroscopy Time:  Not specified.
Radiation Exposure Index (if provided by the fluoroscopic device):
Number of Acquired Spot Images: 0

[Series 1: run · 1 of 1 slices shown]
[im 1/1]
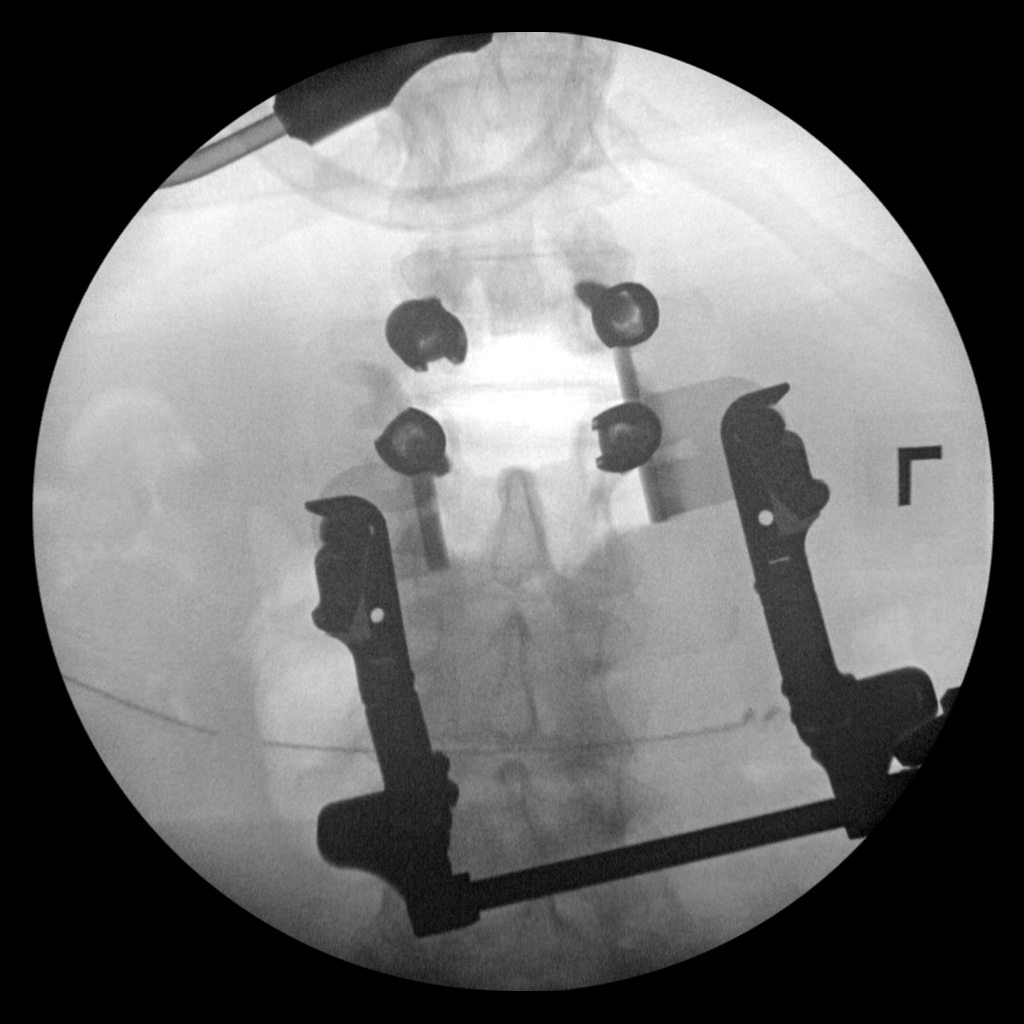

[1 of 1 positions shown; findings below may reference images not displayed]

FINDINGS: Single intraoperative fluoroscopic of the spine AP spot view at [BV]
hours. 2 level bilateral pedicle screws are in place at what appears
to be L1 and L2 judging from full size ribs at T12. sequelae of
posterior laminectomy. Connecting rods not yet in place.
IMPRESSION: L1-L2 decompression and fusion underway.

## 2020-10-03 SURGERY — POSTERIOR LUMBAR FUSION 1 LEVEL
Anesthesia: General | Site: Spine Lumbar

## 2020-10-03 MED ORDER — THROMBIN 5000 UNITS EX SOLR
CUTANEOUS | Status: AC
  Filled 2020-10-03: qty 5000

## 2020-10-03 MED ORDER — CEFAZOLIN SODIUM-DEXTROSE 2-3 GM-%(50ML) IV SOLR
INTRAVENOUS | Status: DC | PRN
  Administered 2020-10-03: 2 g via INTRAVENOUS

## 2020-10-03 MED ORDER — FENTANYL CITRATE (PF) 100 MCG/2ML IJ SOLN
25.0000 ug | INTRAMUSCULAR | Status: DC | PRN
  Administered 2020-10-03: 25 ug via INTRAVENOUS

## 2020-10-03 MED ORDER — SUCCINYLCHOLINE 20MG/ML (10ML) SYRINGE FOR MEDFUSION PUMP - OPTIME
INTRAMUSCULAR | Status: DC | PRN
  Administered 2020-10-03: 120 mg via INTRAVENOUS

## 2020-10-03 MED ORDER — CEFAZOLIN SODIUM-DEXTROSE 2-4 GM/100ML-% IV SOLN
INTRAVENOUS | Status: AC
  Filled 2020-10-03: qty 100

## 2020-10-03 MED ORDER — ONDANSETRON HCL 4 MG/2ML IJ SOLN: 4.0000 mg | Freq: Once | INTRAMUSCULAR | Status: DC | PRN

## 2020-10-03 MED ORDER — HYDROCODONE-ACETAMINOPHEN 5-325 MG PO TABS
1.0000 | ORAL_TABLET | ORAL | Status: DC | PRN
  Administered 2020-10-03 – 2020-10-05 (×9): 2 via ORAL
  Administered 2020-10-06: 1 via ORAL
  Administered 2020-10-06 – 2020-10-07 (×3): 2 via ORAL
  Administered 2020-10-07: 1 via ORAL
  Administered 2020-10-07: 2 via ORAL
  Filled 2020-10-03: qty 1
  Filled 2020-10-03 (×2): qty 2
  Filled 2020-10-03: qty 1
  Filled 2020-10-03 (×11): qty 2

## 2020-10-03 MED ORDER — THROMBIN 5000 UNITS EX SOLR: OROMUCOSAL | Status: DC | PRN

## 2020-10-03 MED ORDER — 0.9 % SODIUM CHLORIDE (POUR BTL) OPTIME
TOPICAL | Status: DC | PRN
  Administered 2020-10-03: 1000 mL

## 2020-10-03 MED ORDER — LIDOCAINE HCL (CARDIAC) PF 100 MG/5ML IV SOSY
PREFILLED_SYRINGE | INTRAVENOUS | Status: DC | PRN
  Administered 2020-10-03: 80 mg via INTRAVENOUS

## 2020-10-03 MED ORDER — ROCURONIUM 10MG/ML (10ML) SYRINGE FOR MEDFUSION PUMP - OPTIME
INTRAVENOUS | Status: DC | PRN
  Administered 2020-10-03: 10 mg via INTRAVENOUS
  Administered 2020-10-03: 30 mg via INTRAVENOUS

## 2020-10-03 MED ORDER — DEXAMETHASONE SODIUM PHOSPHATE 10 MG/ML IJ SOLN
INTRAMUSCULAR | Status: DC | PRN
  Administered 2020-10-03: 10 mg via INTRAVENOUS

## 2020-10-03 MED ORDER — ACETAMINOPHEN 10 MG/ML IV SOLN
INTRAVENOUS | Status: DC | PRN
  Administered 2020-10-03: 1000 mg via INTRAVENOUS

## 2020-10-03 MED ORDER — LIDOCAINE-EPINEPHRINE 1 %-1:100000 IJ SOLN
INTRAMUSCULAR | Status: AC
  Filled 2020-10-03: qty 1

## 2020-10-03 MED ORDER — FENTANYL CITRATE (PF) 250 MCG/5ML IJ SOLN
INTRAMUSCULAR | Status: DC | PRN
  Administered 2020-10-03 (×2): 50 ug via INTRAVENOUS

## 2020-10-03 MED ORDER — BUPIVACAINE HCL (PF) 0.5 % IJ SOLN
INTRAMUSCULAR | Status: AC
  Filled 2020-10-03: qty 30

## 2020-10-03 MED ORDER — PHENYLEPHRINE HCL-NACL 10-0.9 MG/250ML-% IV SOLN
INTRAVENOUS | Status: DC | PRN
  Administered 2020-10-03: 50 ug/min via INTRAVENOUS

## 2020-10-03 MED ORDER — LACTATED RINGERS IV SOLN: INTRAVENOUS | Status: DC | PRN

## 2020-10-03 MED ORDER — ONDANSETRON HCL 4 MG/2ML IJ SOLN
INTRAMUSCULAR | Status: DC | PRN
  Administered 2020-10-03: 4 mg via INTRAVENOUS

## 2020-10-03 MED ORDER — PROPOFOL 10 MG/ML IV BOLUS
INTRAVENOUS | Status: DC | PRN
  Administered 2020-10-03: 120 mg via INTRAVENOUS

## 2020-10-03 MED ORDER — SUGAMMADEX SODIUM 200 MG/2ML IV SOLN
INTRAVENOUS | Status: DC | PRN
  Administered 2020-10-03: 200 mg via INTRAVENOUS

## 2020-10-03 MED ORDER — MUPIROCIN 2 % EX OINT: 1.0000 "application " | TOPICAL_OINTMENT | Freq: Two times a day (BID) | CUTANEOUS | Status: DC

## 2020-10-03 MED ORDER — CHLORHEXIDINE GLUCONATE CLOTH 2 % EX PADS
6.0000 | MEDICATED_PAD | Freq: Every day | CUTANEOUS | Status: DC
  Administered 2020-10-03 – 2020-10-07 (×5): 6 via TOPICAL

## 2020-10-03 MED ORDER — PHENYLEPHRINE HCL (PRESSORS) 10 MG/ML IV SOLN
INTRAVENOUS | Status: DC | PRN
  Administered 2020-10-03: 120 ug via INTRAVENOUS
  Administered 2020-10-03: 80 ug via INTRAVENOUS

## 2020-10-03 MED ORDER — ACETAMINOPHEN 10 MG/ML IV SOLN
INTRAVENOUS | Status: AC
  Filled 2020-10-03: qty 100

## 2020-10-03 SURGICAL SUPPLY — 59 items
ADH SKN CLS APL DERMABOND .7 (GAUZE/BANDAGES/DRESSINGS) ×2
APL SKNCLS STERI-STRIP NONHPOA (GAUZE/BANDAGES/DRESSINGS)
BASKET BONE COLLECTION (BASKET) ×4 IMPLANT
BENZOIN TINCTURE PRP APPL 2/3 (GAUZE/BANDAGES/DRESSINGS) IMPLANT
BLADE CLIPPER SURG (BLADE) IMPLANT
BLADE SURG 11 STRL SS (BLADE) ×4 IMPLANT
BUR MATCHSTICK NEURO 3.0 LAGG (BURR) ×4 IMPLANT
BUR PRECISION FLUTE 5.0 (BURR) ×4 IMPLANT
CANISTER SUCT 3000ML PPV (MISCELLANEOUS) ×4 IMPLANT
CARTRIDGE OIL MAESTRO DRILL (MISCELLANEOUS) ×2 IMPLANT
CLOSURE WOUND 1/2 X4 (GAUZE/BANDAGES/DRESSINGS)
CNTNR URN SCR LID CUP LEK RST (MISCELLANEOUS) ×2 IMPLANT
CONT SPEC 4OZ STRL OR WHT (MISCELLANEOUS) ×4
COVER BACK TABLE 60X90IN (DRAPES) ×4 IMPLANT
COVER WAND RF STERILE (DRAPES) ×4 IMPLANT
DECANTER SPIKE VIAL GLASS SM (MISCELLANEOUS) ×4 IMPLANT
DERMABOND ADVANCED (GAUZE/BANDAGES/DRESSINGS) ×2
DERMABOND ADVANCED .7 DNX12 (GAUZE/BANDAGES/DRESSINGS) ×2 IMPLANT
DIFFUSER DRILL AIR PNEUMATIC (MISCELLANEOUS) ×4 IMPLANT
DRAPE C-ARM 42X72 X-RAY (DRAPES) ×4 IMPLANT
DRAPE C-ARMOR (DRAPES) ×4 IMPLANT
DRAPE LAPAROTOMY 100X72X124 (DRAPES) ×4 IMPLANT
DRAPE SURG 17X23 STRL (DRAPES) ×4 IMPLANT
DRSG OPSITE POSTOP 4X6 (GAUZE/BANDAGES/DRESSINGS) ×4 IMPLANT
DURAPREP 26ML APPLICATOR (WOUND CARE) ×4 IMPLANT
ELECT REM PT RETURN 9FT ADLT (ELECTROSURGICAL) ×4
ELECTRODE REM PT RTRN 9FT ADLT (ELECTROSURGICAL) ×2 IMPLANT
GAUZE 4X4 16PLY RFD (DISPOSABLE) IMPLANT
GLOVE BIO SURGEON STRL SZ7.5 (GLOVE) ×8 IMPLANT
GLOVE BIOGEL PI IND STRL 7.5 (GLOVE) ×4 IMPLANT
GLOVE BIOGEL PI INDICATOR 7.5 (GLOVE) ×4
GLOVE ECLIPSE 7.0 STRL STRAW (GLOVE) ×8 IMPLANT
GOWN STRL REUS W/ TWL LRG LVL3 (GOWN DISPOSABLE) ×8 IMPLANT
GOWN STRL REUS W/ TWL XL LVL3 (GOWN DISPOSABLE) IMPLANT
GOWN STRL REUS W/TWL 2XL LVL3 (GOWN DISPOSABLE) IMPLANT
GOWN STRL REUS W/TWL LRG LVL3 (GOWN DISPOSABLE) ×16
GOWN STRL REUS W/TWL XL LVL3 (GOWN DISPOSABLE)
HEMOSTAT POWDER KIT SURGIFOAM (HEMOSTASIS) ×8 IMPLANT
KIT BASIN OR (CUSTOM PROCEDURE TRAY) ×4 IMPLANT
KIT INFUSE X SMALL 1.4CC (Orthopedic Implant) ×4 IMPLANT
KIT POSITION SURG JACKSON T1 (MISCELLANEOUS) ×4 IMPLANT
KIT TURNOVER KIT B (KITS) ×4 IMPLANT
MILL MEDIUM DISP (BLADE) ×4 IMPLANT
NEEDLE HYPO 22GX1.5 SAFETY (NEEDLE) ×4 IMPLANT
NS IRRIG 1000ML POUR BTL (IV SOLUTION) ×4 IMPLANT
OIL CARTRIDGE MAESTRO DRILL (MISCELLANEOUS) ×4
PACK LAMINECTOMY NEURO (CUSTOM PROCEDURE TRAY) ×4 IMPLANT
ROD COBALT 47.5X35 (Rod) ×8 IMPLANT
SCREW 5.5X35MM (Screw) ×16 IMPLANT
SCREW BN 35X5.5XMA NS SPNE (Screw) ×8 IMPLANT
SCREW SET SOLERA (Screw) ×16 IMPLANT
SCREW SET SOLERA TI (Screw) ×8 IMPLANT
STRIP CLOSURE SKIN 1/2X4 (GAUZE/BANDAGES/DRESSINGS) IMPLANT
SUT VIC AB 0 CT1 18XCR BRD8 (SUTURE) ×2 IMPLANT
SUT VIC AB 0 CT1 8-18 (SUTURE) ×4
SUT VICRYL 3-0 RB1 18 ABS (SUTURE) ×4 IMPLANT
TOWEL GREEN STERILE (TOWEL DISPOSABLE) ×4 IMPLANT
TOWEL GREEN STERILE FF (TOWEL DISPOSABLE) ×4 IMPLANT
WATER STERILE IRR 1000ML POUR (IV SOLUTION) ×4 IMPLANT

## 2020-10-03 NOTE — Progress Notes (Signed)
Orthopedic Tech Progress Note Patient Details:  Mary Davila Mar 22, 1956 [id number] Ordered brace Patient ID: [name], female   DOB: 1956-04-17, 65 y.o.   MRN: [id number]   [name] 10/03/2020, 7:36 AM

## 2020-10-03 NOTE — Anesthesia Preprocedure Evaluation (Addendum)
Anesthesia Evaluation  Patient identified by MRN, date of birth, ID band Patient awake  General Assessment Comment:Per neurosurgeon: "65 y.o. female POD1 s/p L1-2 laminectomy/microdiscectomy with dense postop paraparesis. Imaging demonstrates large residual/recurrent disc and edema within the conus"  Reviewed: Allergy & Precautions, NPO status , Patient's Chart, lab work & pertinent test results  History of Anesthesia Complications Negative for: history of anesthetic complications  Airway Mallampati: III  TM Distance: >3 FB Neck ROM: Full    Dental  (+) Teeth Intact, Dental Advisory Given   Pulmonary asthma ,    Pulmonary exam normal breath sounds clear to auscultation       Cardiovascular hypertension, Pt. on medications Normal cardiovascular exam Rhythm:Regular Rate:Normal     Neuro/Psych herniated disc    GI/Hepatic Neg liver ROS, GERD  Medicated,  Endo/Other  negative endocrine ROS  Renal/GU negative Renal ROS     Musculoskeletal negative musculoskeletal ROS (+)   Abdominal   Peds  Hematology  (+) Blood dyscrasia, anemia ,   Anesthesia Other Findings Day of surgery medications reviewed with the patient.  Reproductive/Obstetrics                            Anesthesia Physical Anesthesia Plan  ASA: II and emergent  Anesthesia Plan: General   Post-op Pain Management:    Induction: Intravenous  PONV Risk Score and Plan: 3 and Ondansetron, Dexamethasone and Treatment may vary due to age or medical condition  Airway Management Planned: Oral ETT  Additional Equipment:   Intra-op Plan:   Post-operative Plan: Extubation in OR  Informed Consent: I have reviewed the patients History and Physical, chart, labs and discussed the procedure including the risks, benefits and alternatives for the proposed anesthesia with the patient or authorized representative who has indicated his/her  understanding and acceptance.     Dental advisory given  Plan Discussed with: CRNA  Anesthesia Plan Comments:        Anesthesia Quick Evaluation

## 2020-10-03 NOTE — Progress Notes (Signed)
  NEUROSURGERY PROGRESS NOTE   History reviewed. Pt underwent L1-2 laminectomy earlier today with preop bilateral DF weakness. Postoperatively she was found to have significant BLE proximal and distal weakness with dysesthetic pain in R>L leg. She was admitted to [facility] for further workup including MRI.  EXAM:  BP (!) 147/86 (BP Location: Left Arm)   Pulse 81   Temp 98.9 F (37.2 C) (Oral)   Resp 18   SpO2 100%   Awake, alert, oriented  Speech fluent, appropriate  CN grossly intact  5/5 BUE 1/5 bilateral IP, 1-2/5 bilateral Quad, 1/5 PF/DF Decreased subjective LT ~L1 level  IMAGING: MRI C/T spine reviewed demonstrating degenerative disease but no significant spinal cord compression  MRI L spine reviewed demonstrating large superiorly migrated recurrent/residual disc fragment at L1-2. There is T2 hyperintense signal within the conus likely indicating edema of the conus at this level, and the conus appears to be displaced dorsally due to the laminectomy defect. No hematoma.  IMPRESSION:  65 y.o. female s/p L1-2 laminectomy/microdiscectomy earlier today with dense postop paraparesis. Imaging demonstrates large residual/recurrent disc and edema within the conus which appears to be displaced dorsally. In this acute setting, I think repeat discectomy is reasonable for decompression to offer the best chance for recovery. With the conus at this level I think this will require complete facetectomy likely bilaterally which will then require stabilization.   PLAN: - Will proceed with L1-2 laminectomy, discectomy, and posterior instrumented stabilization  I have reviewed the situation with the patient including the imaging findings. I discussed my recommendation for emergent repeat decompression and the need for instrumented stabilization. Pt is aware of associated risks, largely the same as her previous surgery. All her questions were answered and she provided consent to proceed.   Lisbeth Renshaw, MD [facility]

## 2020-10-03 NOTE — Progress Notes (Signed)
Patient ID: Mary Davila, female   DOB: [birth year], 65 y.o.   MRN: [id number] BP (!) 126/55 (BP Location: Right Arm)   Pulse 90   Temp 99 F (37.2 C) (Oral)   Resp 16   SpO2 97%  Alert, oriented x 4 1/5 lower extremities Sensation intact Wound is clean and dry

## 2020-10-03 NOTE — Anesthesia Postprocedure Evaluation (Signed)
Anesthesia Post Note  Patient: Mary Davila  Procedure(s) Performed: LUMBAR ONE-TWO DECOMPRESSION WITH SCREW FIXATION (N/A Spine Lumbar)     Patient location during evaluation: PACU Anesthesia Type: General Level of consciousness: awake and alert Pain management: pain level controlled Vital Signs Assessment: post-procedure vital signs reviewed and stable Respiratory status: spontaneous breathing, nonlabored ventilation, respiratory function stable and patient connected to nasal cannula oxygen Cardiovascular status: blood pressure returned to baseline and stable Postop Assessment: no apparent nausea or vomiting Anesthetic complications: no   No complications documented.  Last Vitals:  Vitals:   10/03/20 0619 10/03/20 0738  BP: (!) 141/80 138/83  Pulse: 64 67  Resp: 20 16  Temp: 36.6 C 36.6 C  SpO2: 100% 92%    Last Pain:  Vitals:   10/03/20 5956  TempSrc: Oral  PainSc:                  Cecile Hearing

## 2020-10-03 NOTE — Evaluation (Addendum)
Physical Therapy Evaluation Patient Details Name: Mary Davila MRN: [id number] DOB: 07-30-1956 Today's Date: 10/03/2020   History of Present Illness  65 y.o. female who underwent bilateral L1-L2 microdiscectomy on 1/13 at the outpatient surgical center who developed rather pronounced BLE weakness and new numbness/tingling involving the RLE. Admitted to [facility] for further workup and MRI. 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 s/p L1 laminectomy with complete facetectomy for decompression of the thecal sac 10/02/20. PMH including THA.  Clinical Impression  Patient presents with generalized weakness, decreased AROM, decreased sensation, pain, impaired balance and impaired mobility s/p above. PTA, pt lives alone and reports using SPC transitioning to RW after falling for ambulation. Daughter checks in on her and does her grocery shopping as pt does not drive. Today, pt requires Max A of 2 for bed mobility and total A for sitting balance. Pt with marked weakness in BLEs and pain in back. Unable to sit unsupported. Education re: back precautions. Highly motivated to return to independence. Would benefit from CIR to maximize independence and mobility prior to return home. Will follow acutely.    Follow Up Recommendations CIR;Supervision for mobility/OOB    Equipment Recommendations  Wheelchair (measurements PT);Wheelchair cushion (measurements PT)    Recommendations for Other Services       Precautions / Restrictions Precautions Precautions: Back;Fall Precaution Booklet Issued: Yes (comment) Precaution Comments: Reviewed back precautions Required Braces or Orthoses: Spinal Brace Spinal Brace: Thoracolumbosacral orthotic;Applied in sitting position Restrictions Weight Bearing Restrictions: No      Mobility  Bed Mobility Overal bed mobility: Needs Assistance Bed Mobility: Rolling;Sidelying to Sit;Sit to Sidelying Rolling: Mod assist;Max assist;+2 for physical  assistance Sidelying to sit: Max assist;+2 for physical assistance     Sit to sidelying: Total assist;+2 for physical assistance General bed mobility comments: Pt able to grab bed rail and assist to pull trunk during roll. Mod-Max A +2 for rolling. Requiring Max A +2 for managing BLEs over EOB and then elevating trunk. Total A +2 for returning to bed safely    Transfers                 General transfer comment: Defer due to poor sitting balance.  Ambulation/Gait                Stairs            Wheelchair Mobility    Modified Rankin (Stroke Patients Only)       Balance Overall balance assessment: Needs assistance Sitting-balance support: Feet supported;Single extremity supported Sitting balance-Leahy Scale: Zero Sitting balance - Comments: Limited by pain; total A for sitting balance. Attempted to have pt come anteriorly to sit unsupported but unwilling/unable. Postural control: Posterior lean     Standing balance comment: Unable                             Pertinent Vitals/Pain Pain Assessment: 0-10 Pain Score: 9  Pain Location: Back and down to left foot; spasms Pain Descriptors / Indicators: Burning;Constant;Grimacing;Shooting (shaking) Pain Intervention(s): Limited activity within patient's tolerance;Monitored during session;Repositioned;Patient requesting pain meds-RN notified    Home Living Family/patient expects to be discharged to:: Private residence Living Arrangements: Alone Available Help at Discharge: Family;Available PRN/intermittently Type of Home: Apartment Home Access: Level entry     Home Layout: One level Home Equipment: Walker - 2 wheels;Cane - single point      Prior Function Level of Independence: Independent with assistive  device(s)         Comments: Was using a SPC and then RW for mobility since recent falls. Does not drive.     Hand Dominance   Dominant Hand: Right    Extremity/Trunk Assessment    Upper Extremity Assessment Upper Extremity Assessment: Defer to OT evaluation    Lower Extremity Assessment Lower Extremity Assessment: RLE deficits/detail;LLE deficits/detail RLE Deficits / Details: No ankle AROM, able to wiggle toes with increased time. Grossly ~2+/5 knee extension, 2-/5 hip flexion, 1/5 hamstrings RLE Sensation: decreased light touch (inconsistent; worse distally on plantar aspect of foot but able to feel medically on ankle) LLE Deficits / Details: No ankle AROM, not able to wiggle toes; Grossly ~2+/5 knee extension, 2-/5 hip flexion, 1/5 hamstrings LLE Sensation: decreased light touch (inconsistent; worse distally on plantar aspect of foot but able to feel medically on ankle)    Cervical / Trunk Assessment Cervical / Trunk Assessment: Other exceptions Cervical / Trunk Exceptions: s/p back sx  Communication   Communication: No difficulties  Cognition Arousal/Alertness: Awake/alert Behavior During Therapy: WFL for tasks assessed/performed Overall Cognitive Status: Within Functional Limits for tasks assessed                                 General Comments: Very motivated despite signficiant pain      General Comments General comments (skin integrity, edema, etc.): Denies any dizziness with positional changes    Exercises General Exercises - Lower Extremity Ankle Circles/Pumps: PROM;Both;5 reps;Supine Long Arc Quad: AROM;Both;5 reps;Seated Hip Flexion/Marching: AROM;Both;5 reps;Seated   Assessment/Plan    PT Assessment Patient needs continued PT services  PT Problem List Decreased strength;Decreased mobility;Decreased range of motion;Decreased coordination;Decreased knowledge of precautions;Impaired tone;Impaired sensation;Pain;Decreased balance;Decreased activity tolerance;Decreased skin integrity       PT Treatment Interventions Therapeutic exercise;Gait training;Balance training;Functional mobility training;Therapeutic  activities;Patient/family education;Neuromuscular re-education;DME instruction;Wheelchair mobility training;Manual techniques    PT Goals (Current goals can be found in the Care Plan section)  Acute Rehab PT Goals Patient Stated Goal: Walk again; I always took care of myself PT Goal Formulation: With patient Time For Goal Achievement: 10/17/20 Potential to Achieve Goals: Fair    Frequency Min 5X/week   Barriers to discharge Decreased caregiver support lives alone    Co-evaluation PT/OT/SLP Co-Evaluation/Treatment: Yes Reason for Co-Treatment: To address functional/ADL transfers;For patient/therapist safety PT goals addressed during session: Mobility/safety with mobility;Balance OT goals addressed during session: ADL's and self-care;Strengthening/ROM       AM-PAC PT "6 Clicks" Mobility  Outcome Measure Help needed turning from your back to your side while in a flat bed without using bedrails?: A Lot Help needed moving from lying on your back to sitting on the side of a flat bed without using bedrails?: Total Help needed moving to and from a bed to a chair (including a wheelchair)?: Total Help needed standing up from a chair using your arms (e.g., wheelchair or bedside chair)?: Total Help needed to walk in hospital room?: Total Help needed climbing 3-5 steps with a railing? : Total 6 Click Score: 7    End of Session   Activity Tolerance: Patient limited by pain Patient left: in bed;with call bell/phone within reach;with SCD's reapplied Nurse Communication: Mobility status;Need for lift equipment PT Visit Diagnosis: Pain;Muscle weakness (generalized) (M62.81) Pain - part of body:  (back)    Time: 7510-2585 PT Time Calculation (min) (ACUTE ONLY): 28 min   Charges:   PT Evaluation $  PT Eval Moderate Complexity: 1 Mod          Vale Haven, PT, DPT Acute Rehabilitation Services Pager [phone] Office [phone]      Marcy Panning 10/03/2020, 9:38 AM

## 2020-10-03 NOTE — Progress Notes (Signed)
Upon arrival to PACU, patient able to move bilateral upper extremities with strong bilateral grip.  Patient unable to move lower extremities to command.  Dr. Conchita Paris notified and stated this was to be expected so soon after surgery.  VSS (see flowsheet) and patient resting comfortably in no distress.  Per Dr. Conchita Paris, patient appropriate for 3C post op.  Will continue to monitor.

## 2020-10-03 NOTE — Anesthesia Procedure Notes (Signed)
Procedure Name: Intubation Date/Time: 10/03/2020 2:14 AM Performed by: Molli Hazard, CRNA Pre-anesthesia Checklist: Patient identified, Emergency Drugs available, Suction available and Patient being monitored Patient Re-evaluated:Patient Re-evaluated prior to induction Oxygen Delivery Method: Circle system utilized Preoxygenation: Pre-oxygenation with 100% oxygen Induction Type: IV induction, Rapid sequence and Cricoid Pressure applied Laryngoscope Size: Miller and 2 Grade View: Grade I Tube type: Oral Tube size: 7.5 mm Number of attempts: 1 Airway Equipment and Method: Stylet Placement Confirmation: ETT inserted through vocal cords under direct vision,  positive ETCO2 and breath sounds checked- equal and bilateral Secured at: 23 cm Tube secured with: Tape Dental Injury: Teeth and Oropharynx as per pre-operative assessment

## 2020-10-03 NOTE — Op Note (Signed)
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 Lisbeth Renshaw, MD  ASSISTANT: Cindra Presume, PA-C  ANESTHESIA: General Endotracheal  EBL: 250cc  SPECIMENS: None  DRAINS: None  COMPLICATIONS: None immediate  CONDITION: Hemodynamically stable to PACU  HISTORY: Mary Davila is a 65 y.o. female presenting to the hospital after undergoing L1-L2 laminectomy and microdiscectomy earlier in the day at the outpatient surgical center.  Postoperatively she was found to be densely para paretic and she was transferred to [facility] for further work-up.  Upon my examination she had dense paresis of both legs, at best 1-2/5 strength.  Her MRI demonstrated recurrent/residual superiorly migrated disc at L1 to with effacement of the ventral CSF space and displacement of the conus dorsally.  There was also edema within the conus at this level.  Given the patient's neurologic exam acutely after surgery and the presence of the disc material with displacement of the conus I felt that emergent decompression was indicated.  At this level, I felt that in order to perform the discectomy without any significant traction on the conus it would require bilateral facetectomy in order to minimize traction.  I therefore felt that instrumented stabilization would also be necessary.  I reviewed the situation with the patient including my recommendation for surgery including facetectomy and instrumented stabilization.  We did discuss the risks of the procedure as well as the expected postoperative course and recovery.  After all her questions were answered informed consent was obtained and  witnessed.  PROCEDURE IN DETAIL: The patient was brought to the operating room. After induction of general anesthesia, the patient was positioned on the operative table in the prone position. All pressure points were meticulously padded.  Lotion was refused to remove the previously placed Dermabond.  The area was then prepped and draped in the usual sterile fashion.  After timeout was conducted, the subcutaneous stitches were cut with scissors.  Fascial stitches were also cut.  Self-retaining retractor was then placed.  The L1 laminectomy was identified as well as the thecal sac.  Bovie electrocautery was used to further dissect out the L1 lamina out to the pars interarticularis.  The L1-2 facet complex was also identified.  The L1 laminectomy was then completed with a high-speed drill and carried laterally to identify the medial portion of the pedicle at L1 and L2.  High-speed drill was then used to cut across the pars interarticularis and the inferior articulating process of L1 was removed bilaterally.  Kerrison punches were then used to remove the remainder of the ligamentum flavum at L1-2 as well as the medial aspect of the superior articulating process of L2.  This provided space lateral to the thecal sac in order to place instruments ventral to the thecal sac without any traction.  Multiple large disc fragments were identified superior to the disc space behind the L1 vertebral body and were removed using a combination of ball dissectors and curettes and rongeurs.  Once these disc fragments were removed, I was able to pass a large ball-tipped dissector within the ventral epidural space easily, without any further compression of the thecal sac.  At this point attention was turned to placement of the instrumentation.  Because the pedicles at L1 and L2  were noted to be very narrow with a relatively straight trajectory, I elected to place standard trajectory pedicle screws rather than cortical trajectory  screws.  Bovie electrocautery was used to further dissect laterally past the lateral facet complex and the transverse process of L1 and L2 was identified.  Entry points were identified using standard anatomic landmarks and lateral fluoroscopy.  Pilot holes were then drilled and tapped to 5.5 x 35 mm.  Screws were placed in the pedicles at L1 and L2 bilaterally.  High-speed drill was then used to decorticate the exposed posterolateral bone surface including the facet complex and transverse processes in preparation for posterolateral arthrodesis.  Pre-bent lordotic rod was then placed into the pedicle screws and setscrews were placed and final tightened.  Bone harvested during the decompression was then placed in the posterolateral gutters after BMP was placed from the transverse process of L1-L2.  At this point good hemostasis was secured using a combination of bipolar electrocautery and morselized Gelfoam with thrombin.  The wound was irrigated with normal saline irrigation.  No active bleeding was identified.  The wound was then closed in standard fashion in multiple layers using a combination of interrupted 0 and 3-0 Vicryl stitches.  A layer of Dermabond was placed.  After Dermabond was completely dried, sterile dressing was applied.  At the end of the case all sponge, needle, instrument, and cottonoid counts were correct.  Patient was then transferred to the stretcher and extubated.  She was taken to the postanesthesia care unit in stable hemodynamic condition.   Lisbeth Renshaw, MD [facility]

## 2020-10-03 NOTE — Evaluation (Signed)
Occupational Therapy Evaluation Patient Details Name: Mary Davila MRN: [id number] DOB: [birth year] Today's Date: 10/03/2020    History of Present Illness 65 y.o. female who underwent bilateral L1-L2 microdiscectomy earlier today at the outpatient surgical center who developed rather pronounced BLE weakness and new numbness/tingling involving the RLE. Admitted to [facility] for further workup and MRI. PMH including THA.     Clinical Impression   PTA, pt was living alone and was independent; recently using [facility] and then RW for mobility due to recent falls. Pt currently requiring Mod-Max A for UB ADLs, Total A for LB ADLs, and Mod-Total A +2 for bed mobility. Pt very motivated to participate in therapy and return to PLOF. While sitting at EOB with Max-Total A for support, pt participating in BLE exercises. Pt will require further acute OT to facilitate safe dc. Recommend dc to CIR for intensive OT to optimize safety, independence with ADLs, and return to PLOF.     Follow Up Recommendations  CIR    Equipment Recommendations  3 in 1 bedside commode;Tub/shower seat;Wheelchair (measurements OT);Wheelchair cushion (measurements OT)    Recommendations for Other Services PT consult     Precautions / Restrictions Precautions Precautions: Back;Fall Precaution Booklet Issued: Yes (comment) Precaution Comments: Reviewed back precautions Required Braces or Orthoses: Spinal Brace Spinal Brace: Thoracolumbosacral orthotic;Applied in sitting position      Mobility Bed Mobility Overal bed mobility: Needs Assistance Bed Mobility: Rolling;Sidelying to Sit;Sit to Sidelying Rolling: Mod assist;Max assist;+2 for physical assistance Sidelying to sit: Max assist;+2 for physical assistance     Sit to sidelying: Total assist;+2 for physical assistance General bed mobility comments: Pt able to grab bed rail and assist to pull trunk during roll. Mod-Max A +2 for rolling. Requiring Max A +2 for managing BLEs over  EOB and then elevating trunk. Total A +2 for returning to bed safely    Transfers                 General transfer comment: Defer    Balance Overall balance assessment: Needs assistance Sitting-balance support: Feet supported;Bilateral upper extremity supported;No upper extremity supported Sitting balance-Leahy Scale: Zero Sitting balance - Comments: Limited by pain                                   ADL either performed or assessed with clinical judgement   ADL Overall ADL's : Needs assistance/impaired Eating/Feeding: Set up;Bed level   Grooming: Set up;Bed level   Upper Body Bathing: Moderate assistance;Sitting;Bed level;Maximal assistance Upper Body Bathing Details (indicate cue type and reason): Pt requiring Max A for support while sitting due to pain at back. Pending support, pt able to perform UB bathing with Mod A Lower Body Bathing: Total assistance;Bed level   Upper Body Dressing : Maximal assistance;Sitting;Bed level   Lower Body Dressing: Total assistance;Bed level   Toilet Transfer: Total assistance             General ADL Comments: Pt limited by significant pain at back and poor functional use of BLEs     Vision Baseline Vision/History: Wears glasses Wears Glasses: At all times Patient Visual Report: No change from baseline       Perception     Praxis      Pertinent Vitals/Pain Pain Assessment: 0-10 Pain Score: 9  Pain Location: Back and down to left foot; spasms Pain Descriptors / Indicators: Burning;Constant;Grimacing;Shooting (shaking) Pain Intervention(s): Monitored during session;Limited activity  within patient's tolerance;Repositioned     Hand Dominance Right   Extremity/Trunk Assessment Upper Extremity Assessment Upper Extremity Assessment: Overall WFL for tasks assessed   Lower Extremity Assessment Lower Extremity Assessment: Defer to PT evaluation   Cervical / Trunk Assessment Cervical / Trunk Assessment:  Other exceptions Cervical / Trunk Exceptions: s/p back sx   Communication Communication Communication: No difficulties   Cognition Arousal/Alertness: Awake/alert Behavior During Therapy: WFL for tasks assessed/performed Overall Cognitive Status: Within Functional Limits for tasks assessed                                 General Comments: Very motivated despite signficiant pain   General Comments  Denies any dizziness with positional changes    Exercises Exercises: General Lower Extremity General Exercises - Lower Extremity Ankle Circles/Pumps: PROM;Both;5 reps;Supine Long Arc Quad: AROM;Both;5 reps;Seated Hip Flexion/Marching: AROM;Both;5 reps;Seated   Shoulder Instructions      Home Living Family/patient expects to be discharged to:: Private residence Living Arrangements: Alone Available Help at Discharge: Family;Available PRN/intermittently Type of Home: Apartment Home Access: Level entry     Home Layout: One level     Bathroom Shower/Tub: Chief Strategy Officer: Standard     Home Equipment: Environmental consultant - 2 wheels;Cane - single point          Prior Functioning/Environment Level of Independence: Independent with assistive device(s)        Comments: Was using a SPC and then RW for mobility since recent falls        OT Problem List: Decreased strength;Decreased range of motion;Decreased activity tolerance;Impaired balance (sitting and/or standing);Decreased safety awareness;Decreased knowledge of use of DME or AE;Decreased knowledge of precautions;Pain      OT Treatment/Interventions: Self-care/ADL training;Therapeutic exercise;DME and/or AE instruction;Energy conservation;Therapeutic activities;Patient/family education    OT Goals(Current goals can be found in the care plan section) Acute Rehab OT Goals Patient Stated Goal: Walk again; I always took care of myself OT Goal Formulation: With patient Time For Goal Achievement:  10/17/20 Potential to Achieve Goals: Good  OT Frequency: Min 2X/week   Barriers to D/C:            Co-evaluation PT/OT/SLP Co-Evaluation/Treatment: Yes Reason for Co-Treatment: To address functional/ADL transfers;For patient/therapist safety   OT goals addressed during session: ADL's and self-care;Strengthening/ROM      AM-PAC OT "6 Clicks" Daily Activity     Outcome Measure Help from another person eating meals?: A Little Help from another person taking care of personal grooming?: A Little Help from another person toileting, which includes using toliet, bedpan, or urinal?: Total Help from another person bathing (including washing, rinsing, drying)?: A Lot Help from another person to put on and taking off regular upper body clothing?: A Lot Help from another person to put on and taking off regular lower body clothing?: Total 6 Click Score: 12   End of Session Nurse Communication: Mobility status;Other (comment);Patient requests pain meds (Muscle relaxor; needs PRAFOs)  Activity Tolerance: Patient tolerated treatment well;Patient limited by pain Patient left: in bed;with call bell/phone within reach  OT Visit Diagnosis: Unsteadiness on feet (R26.81);Other abnormalities of gait and mobility (R26.89);Muscle weakness (generalized) (M62.81);Pain;History of falling (Z91.81) Pain - part of body:  (back)                Time: 1610-9604 OT Time Calculation (min): 27 min Charges:  OT General Charges $OT Visit: 1 Visit OT Evaluation $OT Eval  Moderate Complexity: 1 Mod  Ceylon Arenson MSOT, OTR/L Acute Rehab Pager: [phone] Office: [phone]  Theodoro Grist Marleni Gallardo 10/03/2020, 9:21 AM

## 2020-10-03 NOTE — Progress Notes (Signed)
Inpatient Rehab Admissions Coordinator Note:   Per therapy recommendations, pt was screened for CIR candidacy by Estill Dooms, PT, DPT.  At this time note out of bed mobility limited by decreased sitting balance and LE weakness.  Will follow for 1-2 more therapy sessions and re-screen at that time.  Please contact me with questions.   Estill Dooms, PT, DPT [phone] 10/03/20 2:19 PM

## 2020-10-03 NOTE — Transfer of Care (Signed)
Immediate Anesthesia Transfer of Care Note  Patient: Mary Davila  Procedure(s) Performed: LUMBAR ONE-TWO DECOMPRESSION WITH SCREW FIXATION (N/A Spine Lumbar)  Patient Location: PACU  Anesthesia Type:General  Level of Consciousness: sedated  Airway & Oxygen Therapy: Patient connected to face mask oxygen  Post-op Assessment: Report given to RN and Post -op Vital signs reviewed and stable  Post vital signs: Reviewed and stable  Last Vitals:  Vitals Value Taken Time  BP 119/106 10/03/20 0458  Temp    Pulse 100 10/03/20 0500  Resp 21 10/03/20 0500  SpO2 88 % 10/03/20 0500  Vitals shown include unvalidated device data.  Last Pain:  Vitals:   10/03/20 0114  TempSrc:   PainSc: 10-Worst pain ever      Patients Stated Pain Goal: 3 (10/03/20 0114)  Complications: No complications documented.

## 2020-10-04 MED ORDER — GABAPENTIN 300 MG PO CAPS
300.0000 mg | ORAL_CAPSULE | Freq: Three times a day (TID) | ORAL | Status: DC
  Administered 2020-10-04 – 2020-10-07 (×8): 300 mg via ORAL
  Filled 2020-10-04 (×8): qty 1

## 2020-10-04 NOTE — Progress Notes (Signed)
Orthopedic Tech Progress Note Patient Details:  Mary Davila 28-May-1956 [id number]  Patient ID: Mary Davila, female   DOB: 1956/07/06, 65 y.o.   MRN: [id number] [employer] [facility]  Trinna Post 10/04/2020, 8:14 PM

## 2020-10-04 NOTE — Progress Notes (Signed)
Occupational Therapy Treatment Patient Details Name: Mary Davila MRN: [id number] DOB: 1955/12/10 Today's Date: 10/04/2020    History of present illness 65 y.o. female who underwent bilateral L1-L2 microdiscectomy on 1/13 at the outpatient surgical center who developed rather pronounced BLE weakness and new numbness/tingling involving the RLE. Admitted to [facility] for further workup and MRI. 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 s/p L1 laminectomy with complete facetectomy for decompression of the thecal sac 10/02/20. PMH including THA.   OT comments  Pt. Is able to state her back precautions and can instruct on proper way for supine to sit and sit to supine. Pt. Able to perform supine to sit with one person assist at MOD A level. Pt. Requires 2 person assist for sit to supine with MOD A. Pt. Required 3 person assist with sit to stand with steady times 2. Pt. Was able to sit EOB and work on sitting balance for 20 min. Acute OT to follow.   Follow Up Recommendations  CIR    Equipment Recommendations  3 in 1 bedside commode;Tub/shower seat;Wheelchair (measurements OT);Wheelchair cushion (measurements OT)    Recommendations for Other Services      Precautions / Restrictions Precautions Precautions: Back;Fall Precaution Booklet Issued: Yes (comment) Precaution Comments: Reviewed back precautions Required Braces or Orthoses: Spinal Brace Spinal Brace: Thoracolumbosacral orthotic;Applied in sitting position Restrictions Weight Bearing Restrictions: No       Mobility Bed Mobility Overal bed mobility: Needs Assistance Bed Mobility: Rolling;Sidelying to Sit;Sit to Sidelying Rolling: Mod assist Sidelying to sit: Mod assist     Sit to sidelying: Mod assist;+2 for physical assistance;+2 for safety/equipment General bed mobility comments: With use of bed rails, patient able to assist with rolling and pushing trunk up to sitting. ModA for B LEs over EOB and trunk  elevation  Transfers Overall transfer level: Needs assistance Equipment used: Ambulation equipment used Transfers: Sit to/from Stand Sit to Stand: Max assist;+2 physical assistance         General transfer comment: Had 3 person assist with standing.    Balance Overall balance assessment: Needs assistance Sitting-balance support: Feet supported;Single extremity supported Sitting balance-Leahy Scale: Fair Sitting balance - Comments: Performed dynamic reaching tasks with patient able reach outside of her BOS with close supervision and single UE on bed. Able to sit EOB with close supervision >20 minutes Postural control: Posterior lean                                 ADL either performed or assessed with clinical judgement   ADL                                               Vision       Perception     Praxis      Cognition Arousal/Alertness: Awake/alert Behavior During Therapy: WFL for tasks assessed/performed Overall Cognitive Status: Within Functional Limits for tasks assessed                                 General Comments: Pt. was cooperative during session        Exercises     Shoulder Instructions       General Comments  Pertinent Vitals/ Pain       Pain Assessment: 0-10 Pain Score: 6  Pain Location: b hips Pain Intervention(s): Limited activity within patient's tolerance;Monitored during session  Home Living                                          Prior Functioning/Environment              Frequency  Min 2X/week        Progress Toward Goals  OT Goals(current goals can now be found in the care plan section)  Progress towards OT goals: Progressing toward goals  Acute Rehab OT Goals Patient Stated Goal: to be able to walk OT Goal Formulation: With patient Time For Goal Achievement: 10/17/20 Potential to Achieve Goals: Good ADL Goals Pt Will Perform Grooming:  with min assist;sitting Pt Will Perform Upper Body Dressing: with min assist;sitting Pt Will Transfer to Toilet: with mod assist;with transfer board;bedside commode Pt/caregiver will Perform Home Exercise Program: Increased strength;Both right and left upper extremity;Independently;With written HEP provided Additional ADL Goal #1: Pt will perform bed mobility with Mod A in preparation for ADLs  Plan Discharge plan remains appropriate    Co-evaluation                 AM-PAC OT "6 Clicks" Daily Activity     Outcome Measure   Help from another person eating meals?: None Help from another person taking care of personal grooming?: A Little Help from another person toileting, which includes using toliet, bedpan, or urinal?: Total Help from another person bathing (including washing, rinsing, drying)?: A Lot Help from another person to put on and taking off regular upper body clothing?: A Lot Help from another person to put on and taking off regular lower body clothing?: Total 6 Click Score: 13    End of Session    OT Visit Diagnosis: Unsteadiness on feet (R26.81);Other abnormalities of gait and mobility (R26.89);Muscle weakness (generalized) (M62.81);Pain;History of falling (Z91.81)   Activity Tolerance Patient tolerated treatment well   Patient Left in bed;with call bell/phone within reach;with bed alarm set   Nurse Communication  (ok therapy.)        Time: 1610-9604 OT Time Calculation (min): 36 min  Charges: OT General Charges $OT Visit: 1 Visit OT Treatments $Therapeutic Activity: 23-37 mins  Derrek Gu OT/L    Deren Degrazia 10/04/2020, 1:58 PM

## 2020-10-04 NOTE — Progress Notes (Signed)
Inpatient Rehab Admissions Coordinator:   Met with patient at bedside to discuss potential CIR admission. Pt. Stated interest. Will pursue for potential admit next week, pending bed availability and insurance auth.   Tayte Childers, MS, CCC-SLP Rehab Admissions Coordinator  [phone] (celll) [phone] (office)  

## 2020-10-04 NOTE — Progress Notes (Signed)
Physical Therapy Treatment Patient Details Name: Mary Davila MRN: [id number] DOB: 1956-02-18 Today's Date: 10/04/2020    History of Present Illness 65 y.o. female who underwent bilateral L1-L2 microdiscectomy on 1/13 at the outpatient surgical center who developed rather pronounced BLE weakness and new numbness/tingling involving the RLE. Admitted to [facility] for further workup and MRI. 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 s/p L1 laminectomy with complete facetectomy for decompression of the thecal sac 10/02/20. PMH including THA.    PT Comments    Patient making progress towards physical therapy goals this session. Patient modA for supine>sit and modA+2 for sit>supine with use of bed rails. Patient able to sit EOB with close supervision >20 minutes and performed dynamic reaching tasks outside of BOS. Performed therex seated EOB and supine. Attempted sit to stand x 3 with STEDY and maxA+2, unable to achieve upright standing, however able to clear buttocks from bed each time. Patient continues to be limited by LE weakness, impaired sensation, decreased activity tolerance, and impaired balance. Continue to recommend comprehensive inpatient rehab (CIR) for post-acute therapy needs.     Follow Up Recommendations  CIR;Supervision for mobility/OOB     Equipment Recommendations  Wheelchair (measurements PT);Wheelchair cushion (measurements PT);Hospital bed    Recommendations for Other Services       Precautions / Restrictions Precautions Precautions: Back;Fall Precaution Booklet Issued: Yes (comment) Precaution Comments: Reviewed back precautions Required Braces or Orthoses: Spinal Brace Spinal Brace: Thoracolumbosacral orthotic;Applied in sitting position Restrictions Weight Bearing Restrictions: No    Mobility  Bed Mobility Overal bed mobility: Needs Assistance Bed Mobility: Rolling;Sidelying to Sit;Sit to Sidelying Rolling: Mod assist Sidelying to sit: Mod  assist     Sit to sidelying: Mod assist;+2 for physical assistance;+2 for safety/equipment General bed mobility comments: With use of bed rails, patient able to assist with rolling and pushing trunk up to sitting. ModA for B LEs over EOB and trunk elevation  Transfers Overall transfer level: Needs assistance Equipment used: Ambulation equipment used Transfers: Sit to/from Stand Sit to Stand: Max assist;+2 physical assistance;+2 safety/equipment         General transfer comment: Attempted x 3 with STEDY. Unable to achieve upright standing, however able to clear buttocks each time. Improved performance with patient looking at feet due to impaired sensation  Ambulation/Gait                 Stairs             Wheelchair Mobility    Modified Rankin (Stroke Patients Only)       Balance Overall balance assessment: Needs assistance Sitting-balance support: Feet supported;Single extremity supported Sitting balance-Leahy Scale: Fair Sitting balance - Comments: Performed dynamic reaching tasks with patient able reach outside of her BOS with close supervision and single UE on bed. Able to sit EOB with close supervision >20 minutes                                    Cognition Arousal/Alertness: Awake/alert Behavior During Therapy: WFL for tasks assessed/performed Overall Cognitive Status: Within Functional Limits for tasks assessed                                        Exercises General Exercises - Lower Extremity Ankle Circles/Pumps: PROM;Both;5 reps;Supine Quad Sets: AROM;Both;10 reps;Supine Long Arc  Quad: AROM;Both;10 reps;Seated Straight Leg Raises: AAROM;Both;5 reps Other Exercises Other Exercises: Dynamic siting balance with reaching targets with each UE    General Comments        Pertinent Vitals/Pain Pain Assessment: Faces Faces Pain Scale: Hurts little more Pain Location: back Pain Descriptors / Indicators:  Grimacing;Cramping;Guarding Pain Intervention(s): Monitored during session;Limited activity within patient's tolerance    Home Living                      Prior Function            PT Goals (current goals can now be found in the care plan section) Acute Rehab PT Goals Patient Stated Goal: Walk again; I always took care of myself PT Goal Formulation: With patient Time For Goal Achievement: 10/17/20 Potential to Achieve Goals: Fair Progress towards PT goals: Progressing toward goals    Frequency    Min 5X/week      PT Plan Current plan remains appropriate    Co-evaluation              AM-PAC PT "6 Clicks" Mobility   Outcome Measure  Help needed turning from your back to your side while in a flat bed without using bedrails?: A Lot Help needed moving from lying on your back to sitting on the side of a flat bed without using bedrails?: A Lot Help needed moving to and from a bed to a chair (including a wheelchair)?: Total Help needed standing up from a chair using your arms (e.g., wheelchair or bedside chair)?: Total Help needed to walk in hospital room?: Total Help needed climbing 3-5 steps with a railing? : Total 6 Click Score: 8    End of Session Equipment Utilized During Treatment: Gait belt;Back brace Activity Tolerance: Patient tolerated treatment well Patient left: in bed;with call bell/phone within reach Nurse Communication: Mobility status;Need for lift equipment PT Visit Diagnosis: Pain;Muscle weakness (generalized) (M62.81) Pain - part of body:  (back)     Time: 2725-3664 PT Time Calculation (min) (ACUTE ONLY): 35 min  Charges:  $Therapeutic Exercise: 8-22 mins $Therapeutic Activity: 8-22 mins                     Latacha Texeira A. Dan Humphreys PT, DPT Acute Rehabilitation Services Pager [phone] Office [phone]    Elissa Lovett 10/04/2020, 9:50 AM

## 2020-10-04 NOTE — Progress Notes (Signed)
Subjective: Patient reports no changes in her strength  Objective: Vital signs in last 24 hours: Temp:  [97.9 F (36.6 C)-99.8 F (37.7 C)] 98.1 F (36.7 C) (01/15 0724) Pulse Rate:  [74-92] 74 (01/15 0724) Resp:  [16-18] 16 (01/15 0724) BP: (126-149)/(55-84) 149/83 (01/15 0724) SpO2:  [94 %-97 %] 95 % (01/15 0724)  Intake/Output from previous day: 01/14 0701 - 01/15 0700 In: -  Out: 3000 [Urine:3000] Intake/Output this shift: No intake/output data recorded.  Awake, alert, Ox3 Bilateral foot drop, 2/5 PF, 2/5 HF and KE.  Lab Results: Recent Labs    10/02/20 1955  WBC 6.2  HGB 11.8*  HCT 36.6  PLT 298   BMET Recent Labs    10/02/20 1955  NA 140  K 2.9*  CL 101  CO2 26  GLUCOSE 189*  BUN 14  CREATININE 0.96  CALCIUM 9.2    Studies/Results: MR CERVICAL SPINE W WO CONTRAST  Result Date: 10/03/2020 CLINICAL DATA:  Status post recent microdiscectomy. Unable to stand following surgery. Loss of sensation left lower extremity. EXAM: MRI CERVICAL, THORACIC AND LUMBAR SPINE WITHOUT AND WITH CONTRAST TECHNIQUE: Multiplanar and multiecho pulse sequences of the cervical spine, to include the craniocervical junction and cervicothoracic junction, and thoracic and lumbar spine, were obtained without and with intravenous contrast. CONTRAST:  7.93mL GADAVIST GADOBUTROL 1 MMOL/ML IV SOLN COMPARISON:  Lumbar spine MRI 09/10/2020 FINDINGS: MRI CERVICAL SPINE FINDINGS Alignment: Normal Vertebrae: No fracture, evidence of discitis, or bone lesion. Cord: Normal spinal cord Posterior Fossa, vertebral arteries, paraspinal tissues: Negative Disc levels: C1-2: Unremarkable. C2-3: Small central disc protrusion. Mild spinal canal stenosis. No neural foraminal stenosis. C3-4: Moderate left facet hypertrophy and right foraminal disc protrusion. There is no spinal canal stenosis. Moderate bilateral neural foraminal stenosis. C4-5: Mild uncovertebral spurring. There is no spinal canal stenosis. Mild  bilateral neural foraminal stenosis. C5-6: Mild uncovertebral spurring with small disc bulge. There is no spinal canal stenosis. Mild bilateral neural foraminal stenosis. C6-7: Normal disc space and facet joints. There is no spinal canal stenosis. No neural foraminal stenosis. C7-T1: Normal disc space and facet joints. There is no spinal canal stenosis. No neural foraminal stenosis. MRI THORACIC SPINE FINDINGS Alignment:  Normal Vertebrae: No acute abnormality. Mild degenerative endplate signal changes at T10, T11 and T12. Cord: Normal spinal cord signal and morphology. No epidural collection. Paraspinal and other soft tissues: Bibasilar atelectasis. Disc levels: Small central disc protrusions at most levels of with no stenosis above the T11-12 level, where there is severe right foraminal stenosis. MRI LUMBAR SPINE FINDINGS Segmentation:  Standard Alignment:  Normal Vertebrae: There is postsurgical change of bilateral laminectomies with posterior decompression at L1-2, which is new. Conus medullaris and cauda equina: Conus extends to the L2 level. There is hyperintense T2-weighted signal within the conus medullaris at the L1-2 level. There is no epidural fluid collection. Paraspinal and other soft tissues: Dorsal postsurgical changes at L1-2. Massively distended urinary bladder, incompletely visualized. Disc levels: L1-L2: Large central disc extrusion with superior migration is unchanged, contacting the anterior aspect of the conus medullaris. Mass effect on the spinal cord is improved owing to posterior decompression. No neural foraminal stenosis. L2-L3: Small disc bulge. No spinal canal stenosis. No neural foraminal stenosis. L3-L4: Small disc bulge. No spinal canal stenosis. Unchanged moderate bilateral neural foraminal stenosis. L4-L5: Intermediate sized disc bulge, unchanged. Mild facet hypertrophy. Unchanged mild spinal canal stenosis. Worsened, now severe left neural foraminal stenosis and unchanged mild  right neural foraminal stenosis. L5-S1: Small disc  bulge with endplate spurring and disc height loss. No spinal canal stenosis. Unchanged severe bilateral neural foraminal stenosis. Visualized sacrum: Normal. No abnormal contrast enhancement. IMPRESSION: 1. Postsurgical changes of bilateral laminectomies at L1-2, which are new. There is hyperintense T2-weighted signal within the conus medullaris, which may be secondary to edema or myelomalacia. 2. No epidural hematoma. 3. Progression of left L4-5 neural foraminal stenosis, which is now severe. 4. Unchanged severe bilateral neural foraminal stenosis at L5-S1. 5. Unchanged mild spinal canal stenosis at L4-5. 6. Massively distended urinary bladder, incompletely visualized. Electronically Signed   By: Deatra RobinsonKevin  Herman M.D.   On: 10/03/2020 01:07   MR THORACIC SPINE W WO CONTRAST  Result Date: 10/03/2020 CLINICAL DATA:  Status post recent microdiscectomy. Unable to stand following surgery. Loss of sensation left lower extremity. EXAM: MRI CERVICAL, THORACIC AND LUMBAR SPINE WITHOUT AND WITH CONTRAST TECHNIQUE: Multiplanar and multiecho pulse sequences of the cervical spine, to include the craniocervical junction and cervicothoracic junction, and thoracic and lumbar spine, were obtained without and with intravenous contrast. CONTRAST:  7.465mL GADAVIST GADOBUTROL 1 MMOL/ML IV SOLN COMPARISON:  Lumbar spine MRI 09/10/2020 FINDINGS: MRI CERVICAL SPINE FINDINGS Alignment: Normal Vertebrae: No fracture, evidence of discitis, or bone lesion. Cord: Normal spinal cord Posterior Fossa, vertebral arteries, paraspinal tissues: Negative Disc levels: C1-2: Unremarkable. C2-3: Small central disc protrusion. Mild spinal canal stenosis. No neural foraminal stenosis. C3-4: Moderate left facet hypertrophy and right foraminal disc protrusion. There is no spinal canal stenosis. Moderate bilateral neural foraminal stenosis. C4-5: Mild uncovertebral spurring. There is no spinal canal stenosis.  Mild bilateral neural foraminal stenosis. C5-6: Mild uncovertebral spurring with small disc bulge. There is no spinal canal stenosis. Mild bilateral neural foraminal stenosis. C6-7: Normal disc space and facet joints. There is no spinal canal stenosis. No neural foraminal stenosis. C7-T1: Normal disc space and facet joints. There is no spinal canal stenosis. No neural foraminal stenosis. MRI THORACIC SPINE FINDINGS Alignment:  Normal Vertebrae: No acute abnormality. Mild degenerative endplate signal changes at T10, T11 and T12. Cord: Normal spinal cord signal and morphology. No epidural collection. Paraspinal and other soft tissues: Bibasilar atelectasis. Disc levels: Small central disc protrusions at most levels of with no stenosis above the T11-12 level, where there is severe right foraminal stenosis. MRI LUMBAR SPINE FINDINGS Segmentation:  Standard Alignment:  Normal Vertebrae: There is postsurgical change of bilateral laminectomies with posterior decompression at L1-2, which is new. Conus medullaris and cauda equina: Conus extends to the L2 level. There is hyperintense T2-weighted signal within the conus medullaris at the L1-2 level. There is no epidural fluid collection. Paraspinal and other soft tissues: Dorsal postsurgical changes at L1-2. Massively distended urinary bladder, incompletely visualized. Disc levels: L1-L2: Large central disc extrusion with superior migration is unchanged, contacting the anterior aspect of the conus medullaris. Mass effect on the spinal cord is improved owing to posterior decompression. No neural foraminal stenosis. L2-L3: Small disc bulge. No spinal canal stenosis. No neural foraminal stenosis. L3-L4: Small disc bulge. No spinal canal stenosis. Unchanged moderate bilateral neural foraminal stenosis. L4-L5: Intermediate sized disc bulge, unchanged. Mild facet hypertrophy. Unchanged mild spinal canal stenosis. Worsened, now severe left neural foraminal stenosis and unchanged mild  right neural foraminal stenosis. L5-S1: Small disc bulge with endplate spurring and disc height loss. No spinal canal stenosis. Unchanged severe bilateral neural foraminal stenosis. Visualized sacrum: Normal. No abnormal contrast enhancement. IMPRESSION: 1. Postsurgical changes of bilateral laminectomies at L1-2, which are new. There is hyperintense T2-weighted signal within the  conus medullaris, which may be secondary to edema or myelomalacia. 2. No epidural hematoma. 3. Progression of left L4-5 neural foraminal stenosis, which is now severe. 4. Unchanged severe bilateral neural foraminal stenosis at L5-S1. 5. Unchanged mild spinal canal stenosis at L4-5. 6. Massively distended urinary bladder, incompletely visualized. Electronically Signed   By: Deatra Robinson M.D.   On: 10/03/2020 01:07   MR Lumbar Spine W Wo Contrast  Result Date: 10/03/2020 CLINICAL DATA:  Status post recent microdiscectomy. Unable to stand following surgery. Loss of sensation left lower extremity. EXAM: MRI CERVICAL, THORACIC AND LUMBAR SPINE WITHOUT AND WITH CONTRAST TECHNIQUE: Multiplanar and multiecho pulse sequences of the cervical spine, to include the craniocervical junction and cervicothoracic junction, and thoracic and lumbar spine, were obtained without and with intravenous contrast. CONTRAST:  7.14mL GADAVIST GADOBUTROL 1 MMOL/ML IV SOLN COMPARISON:  Lumbar spine MRI 09/10/2020 FINDINGS: MRI CERVICAL SPINE FINDINGS Alignment: Normal Vertebrae: No fracture, evidence of discitis, or bone lesion. Cord: Normal spinal cord Posterior Fossa, vertebral arteries, paraspinal tissues: Negative Disc levels: C1-2: Unremarkable. C2-3: Small central disc protrusion. Mild spinal canal stenosis. No neural foraminal stenosis. C3-4: Moderate left facet hypertrophy and right foraminal disc protrusion. There is no spinal canal stenosis. Moderate bilateral neural foraminal stenosis. C4-5: Mild uncovertebral spurring. There is no spinal canal stenosis.  Mild bilateral neural foraminal stenosis. C5-6: Mild uncovertebral spurring with small disc bulge. There is no spinal canal stenosis. Mild bilateral neural foraminal stenosis. C6-7: Normal disc space and facet joints. There is no spinal canal stenosis. No neural foraminal stenosis. C7-T1: Normal disc space and facet joints. There is no spinal canal stenosis. No neural foraminal stenosis. MRI THORACIC SPINE FINDINGS Alignment:  Normal Vertebrae: No acute abnormality. Mild degenerative endplate signal changes at T10, T11 and T12. Cord: Normal spinal cord signal and morphology. No epidural collection. Paraspinal and other soft tissues: Bibasilar atelectasis. Disc levels: Small central disc protrusions at most levels of with no stenosis above the T11-12 level, where there is severe right foraminal stenosis. MRI LUMBAR SPINE FINDINGS Segmentation:  Standard Alignment:  Normal Vertebrae: There is postsurgical change of bilateral laminectomies with posterior decompression at L1-2, which is new. Conus medullaris and cauda equina: Conus extends to the L2 level. There is hyperintense T2-weighted signal within the conus medullaris at the L1-2 level. There is no epidural fluid collection. Paraspinal and other soft tissues: Dorsal postsurgical changes at L1-2. Massively distended urinary bladder, incompletely visualized. Disc levels: L1-L2: Large central disc extrusion with superior migration is unchanged, contacting the anterior aspect of the conus medullaris. Mass effect on the spinal cord is improved owing to posterior decompression. No neural foraminal stenosis. L2-L3: Small disc bulge. No spinal canal stenosis. No neural foraminal stenosis. L3-L4: Small disc bulge. No spinal canal stenosis. Unchanged moderate bilateral neural foraminal stenosis. L4-L5: Intermediate sized disc bulge, unchanged. Mild facet hypertrophy. Unchanged mild spinal canal stenosis. Worsened, now severe left neural foraminal stenosis and unchanged mild  right neural foraminal stenosis. L5-S1: Small disc bulge with endplate spurring and disc height loss. No spinal canal stenosis. Unchanged severe bilateral neural foraminal stenosis. Visualized sacrum: Normal. No abnormal contrast enhancement. IMPRESSION: 1. Postsurgical changes of bilateral laminectomies at L1-2, which are new. There is hyperintense T2-weighted signal within the conus medullaris, which may be secondary to edema or myelomalacia. 2. No epidural hematoma. 3. Progression of left L4-5 neural foraminal stenosis, which is now severe. 4. Unchanged severe bilateral neural foraminal stenosis at L5-S1. 5. Unchanged mild spinal canal stenosis at L4-5. 6.  Massively distended urinary bladder, incompletely visualized. Electronically Signed   By: Deatra Robinson M.D.   On: 10/03/2020 01:07   DG Lumbar Spine 1 View  Result Date: 10/03/2020 CLINICAL DATA:  65 year old female undergoing L1-L2 decompression and fusion. EXAM: DG C-ARM 1-60 MIN; LUMBAR SPINE - 1 VIEW CONTRAST:  None. FLUOROSCOPY TIME:  Fluoroscopy Time:  Not specified. Radiation Exposure Index (if provided by the fluoroscopic device): Number of Acquired Spot Images: 0 COMPARISON:  Total spine MRI 10/02/2020. intraoperative lumbar radiographs 10/02/2020. FINDINGS: Single intraoperative fluoroscopic of the spine AP spot view at 0359 hours. 2 level bilateral pedicle screws are in place at what appears to be L1 and L2 judging from full size ribs at T12. sequelae of posterior laminectomy. Connecting rods not yet in place. IMPRESSION: L1-L2 decompression and fusion underway. Electronically Signed   By: Odessa Fleming M.D.   On: 10/03/2020 04:46   DG C-Arm 1-60 Min  Result Date: 10/03/2020 CLINICAL DATA:  65 year old female undergoing L1-L2 decompression and fusion. EXAM: DG C-ARM 1-60 MIN; LUMBAR SPINE - 1 VIEW CONTRAST:  None. FLUOROSCOPY TIME:  Fluoroscopy Time:  Not specified. Radiation Exposure Index (if provided by the fluoroscopic device): Number of  Acquired Spot Images: 0 COMPARISON:  Total spine MRI 10/02/2020. intraoperative lumbar radiographs 10/02/2020. FINDINGS: Single intraoperative fluoroscopic of the spine AP spot view at 0359 hours. 2 level bilateral pedicle screws are in place at what appears to be L1 and L2 judging from full size ribs at T12. sequelae of posterior laminectomy. Connecting rods not yet in place. IMPRESSION: L1-L2 decompression and fusion underway. Electronically Signed   By: Odessa Fleming M.D.   On: 10/03/2020 04:46    Assessment/Plan: S/p L1-2 decompression - cont PT/OT - likely rehab - potentially can start pharmacologic DVT prophylaxis tomorrow or Monday   Bedelia Person 10/04/2020, 11:38 AM

## 2020-10-05 ENCOUNTER — Other Ambulatory Visit: Payer: Self-pay

## 2020-10-05 ENCOUNTER — Encounter (HOSPITAL_COMMUNITY): Payer: Self-pay | Admitting: Neurosurgery

## 2020-10-05 MED ORDER — MAGNESIUM CITRATE PO SOLN
1.0000 | Freq: Once | ORAL | Status: AC
  Administered 2020-10-05: 1 via ORAL
  Filled 2020-10-05: qty 296

## 2020-10-05 NOTE — PMR Pre-admission (Signed)
PMR Admission Coordinator Pre-Admission Assessment  Patient: Mary Davila is an 65 y.o., female MRN: [id number] DOB: 1956/08/13 Height: 5' 5"  (165.1 cm) Weight: 73.6 kg   Insurance Information HMO: yes    PPO:      PCP:      IPA:      80/20:      OTHER:  [employer] Medicare HMO      Policy#: [id number]      Subscriber: Pt CM Name:  Lestine Box   Phone#: [phone]    Fax#: [phone]  I received authorization 10/06/20 for admission 10/07/2020 with 5 day window to admit. CM Kierra requests team conference notes be sent to her for d/c planning.  Pre-Cert#: [id number]       Employer: n/a Benefits:  Phone #: opened online with availity portal     Name:  Eff. Date:  09/20/2020 - Present Deductible: does not have for in-network providers OOP Max: $3,900 ($20 met) CIR: $295/day co-pay for days 1-6, $0/day co-pay for days 7-90 SNF:  $0/day co-pay for days 1-20, $188/day co-pay for days 21-100; $0 co-pay 22-100 limited to 100 days/cal yr Outpatient:  $10-40 per visit Home Health:  $0 co-pay, 100% coverage, 0% co-insurance; limited by medical necessity DME: 80% coverage, 20% co-insurance  SECONDARY:  n/a      Financial Counselor:       Phone#:   The Therapist, art Information Summary" for patients in Inpatient Rehabilitation Facilities with attached "[place] Records" was provided and verbally reviewed with: Patient  Emergency Contact Information Contact Information    Name Relation Home Work Mobile   [place] Daughter   [place], [place] Sister   [phone]      Current Medical History   Patient Admitting Diagnosis: s/p spinal decompression History of Present Illness: Mary Davila is a 65 y.o. female with history significant for THA  who underwent bilateral L1-L2 microdiscectomy 10/03/20 at the outpatient spinal  surgical center and then developed pronounced BLE weakness and new numbness/tingling involving the RLE. Because of the  severity of her symptoms, the decision was made to admit her to [facility] for admission and further work up of her symptoms.  Pre op she reports have severe primarily left leg pain and bilateral foot weakness. Post op, new distal RLE pain, N/T and inability to move BLE. Was using a cane prior to surgery for assistance with ambulation, but now unablele to stand. Pt. 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 CIR was consulted to assist Pt. In regaining PLOF.     Patient's medical record from [facility]  has been reviewed by the rehabilitation admission coordinator and physician.  Past Medical History  Past Medical History:  Diagnosis Date  . Arthritis   . Hypertension     Family History   family history is not on file.  Prior Rehab/Hospitalizations Has the patient had prior rehab or hospitalizations prior to admission? No  Has the patient had major surgery during 100 days prior to admission? Yes   Current Medications  Current Facility-Administered Medications:  .  0.9 %  sodium chloride infusion, , Intravenous, Continuous, Costella, Vista Mink, PA-C, Last Rate: 75 mL/hr at 10/03/20 0553, New Bag at 10/03/20 0553 .  acetaminophen (TYLENOL) tablet 650 mg, 650 mg, Oral, Q4H PRN **OR** acetaminophen (TYLENOL) suppository 650 mg, 650 mg, Rectal, Q4H PRN, Costella, Vincent J, PA-C .  amLODipine (NORVASC) tablet 10 mg, 10 mg, Oral, Daily, Costella, Vista Mink, PA-C,  10 mg at 10/07/20 0825 .  bisacodyl (DULCOLAX) suppository 10 mg, 10 mg, Rectal, Daily PRN, Costella, Vincent J, PA-C, 10 mg at 10/06/20 0934 .  Chlorhexidine Gluconate Cloth 2 % PADS 6 each, 6 each, Topical, Daily, Ashok Pall, MD, 6 each at 10/07/20 [phone] .  docusate sodium (COLACE) capsule 100 mg, 100 mg, Oral, BID, Costella, Vincent J, PA-C, 100 mg at 10/07/20 0825 .  famotidine (PEPCID) tablet 20 mg, 20 mg, Oral, BID, Costella, Vincent J, PA-C, 20 mg at 10/07/20 0825 .  fluticasone (FLONASE) 50 MCG/ACT  nasal spray 2 spray, 2 spray, Each Nare, Daily PRN, Costella, Vincent J, PA-C .  gabapentin (NEURONTIN) capsule 300 mg, 300 mg, Oral, TID, Ashok Pall, MD, 300 mg at 10/07/20 0825 .  heparin injection 5,000 Units, 5,000 Units, Subcutaneous, Q8H, Judith Part, MD, 5,000 Units at 10/07/20 0529 .  HYDROcodone-acetaminophen (NORCO/VICODIN) 5-325 MG per tablet 1-2 tablet, 1-2 tablet, Oral, Q4H PRN, Ashok Pall, MD, 2 tablet at 10/07/20 0552 .  HYDROmorphone (DILAUDID) injection 0.5-1 mg, 0.5-1 mg, Intravenous, Q2H PRN, Costella, Vincent J, PA-C, 1 mg at 10/03/20 0114 .  loratadine (CLARITIN) tablet 10 mg, 10 mg, Oral, Daily PRN, Costella, Vincent J, PA-C .  menthol-cetylpyridinium (CEPACOL) lozenge 3 mg, 1 lozenge, Oral, PRN **OR** phenol (CHLORASEPTIC) mouth spray 1 spray, 1 spray, Mouth/Throat, PRN, Costella, Vincent J, PA-C .  methocarbamol (ROBAXIN) tablet 500 mg, 500 mg, Oral, Q6H PRN, 500 mg at 10/07/20 0201 **OR** methocarbamol (ROBAXIN) 500 mg in dextrose 5 % 50 mL IVPB, 500 mg, Intravenous, Q6H PRN, Costella, Vincent J, PA-C .  mometasone-formoterol (DULERA) 200-5 MCG/ACT inhaler 2 puff, 2 puff, Inhalation, BID, Costella, Vista Mink, PA-C, 2 puff at 10/07/20 0802 .  ondansetron (ZOFRAN) tablet 4 mg, 4 mg, Oral, Q6H PRN **OR** ondansetron (ZOFRAN) injection 4 mg, 4 mg, Intravenous, Q6H PRN, Costella, Vincent J, PA-C .  oxyCODONE (Oxy IR/ROXICODONE) immediate release tablet 5-10 mg, 5-10 mg, Oral, Q3H PRN, Costella, Vincent J, PA-C .  polyethylene glycol (MIRALAX / GLYCOLAX) packet 17 g, 17 g, Oral, Daily, Costella, Vincent J, PA-C, 17 g at 10/06/20 1053 .  potassium chloride SA (KLOR-CON) CR tablet 40 mEq, 40 mEq, Oral, BID, Costella, Vincent Lenna Sciara, PA-C, 40 mEq at 10/07/20 0824 .  senna (SENOKOT) tablet 8.6 mg, 1 tablet, Oral, BID, Costella, Vincent J, PA-C, 8.6 mg at 10/06/20 2044 .  senna-docusate (Senokot-S) tablet 1 tablet, 1 tablet, Oral, QHS PRN, Costella, Vincent J, PA-C .  sodium  phosphate (FLEET) 7-19 GM/118ML enema 1 enema, 1 enema, Rectal, Once PRN, Costella, Vista Mink, PA-C .  zolpidem (AMBIEN) tablet 5 mg, 5 mg, Oral, QHS, Costella, Vincent J, PA-C, 5 mg at 10/06/20 2044  Patients Current Diet:  Diet Order            Diet regular Room service appropriate? Yes; Fluid consistency: Thin  Diet effective now                 Precautions / Restrictions Precautions Precautions: Back,Fall Precaution Booklet Issued: Yes (comment) Precaution Comments: Reviewed back precautions Spinal Brace: Thoracolumbosacral orthotic,Applied in sitting position Other Brace: instructed pt not to use over bed trapeze Restrictions Weight Bearing Restrictions: No   Has the patient had 2 or more falls or a fall with injury in the past year? Yes  Prior Activity Level    Prior Functional Level Self Care: Did the patient need help bathing, dressing, using the toilet or eating? Independent  Indoor Mobility: Did the patient need assistance  with walking from room to room (with or without device)? Independent  Stairs: Did the patient need assistance with internal or external stairs (with or without device)? Independent  Functional Cognition: Did the patient need help planning regular tasks such as shopping or remembering to take medications? [place] / Equipment Home Assistive Devices/Equipment: Environmental consultant (specify type),Eyeglasses,Cane (specify quad or straight) Home Equipment: Walker - 2 wheels,Cane - single point  Prior Device Use: Indicate devices/aids used by the patient prior to current illness, exacerbation or injury? None of the above  Current Functional Level Cognition  Overall Cognitive Status: Within Functional Limits for tasks assessed Orientation Level: Oriented X4 General Comments: Pt. was cooperative during session    Extremity Assessment (includes Sensation/Coordination)  Upper Extremity Assessment: Generalized weakness  Lower  Extremity Assessment: RLE deficits/detail,LLE deficits/detail RLE Deficits / Details: No ankle AROM, able to wiggle toes with increased time. Grossly ~2+/5 knee extension, 2-/5 hip flexion, 1/5 hamstrings RLE Sensation: decreased light touch (inconsistent; worse distally on plantar aspect of foot but able to feel medically on ankle) LLE Deficits / Details: No ankle AROM, not able to wiggle toes; Grossly ~2+/5 knee extension, 2-/5 hip flexion, 1/5 hamstrings LLE Sensation: decreased light touch (inconsistent; worse distally on plantar aspect of foot but able to feel medically on ankle)    ADLs  Overall ADL's : Needs assistance/impaired Eating/Feeding: Set up,Bed level Grooming: Set up,Bed level Upper Body Bathing: Moderate assistance,Sitting,Bed level,Maximal assistance Upper Body Bathing Details (indicate cue type and reason): Pt requiring Max A for support while sitting due to pain at back. Pending support, pt able to perform UB bathing with Mod A Lower Body Bathing: Total assistance,Bed level Upper Body Dressing : Maximal assistance,Sitting,Bed level Lower Body Dressing: Total assistance,Bed level Toilet Transfer: Total assistance General ADL Comments: Pt limited by significant pain at back and poor functional use of BLEs    Mobility  Overal bed mobility: Needs Assistance Bed Mobility: Rolling,Sidelying to Sit Rolling: Min assist Sidelying to sit: Mod assist Sit to sidelying: Mod assist,+2 for physical assistance,+2 for safety/equipment General bed mobility comments: assit for LE management for rolling, modA for trunk elevation and LE management to complete sit EOB    Transfers  Overall transfer level: Needs assistance Equipment used: Ambulation equipment used Transfer via Lift Equipment: Stedy Transfers: Sit to/from Stand Sit to Stand: Mod assist,+2 physical assistance General transfer comment: Completed from bed x 2 and from hip pads of stedy x 2 using bed pad to assist pt in  raising hips. Facilitated upright posture in stedy. Pt reporting lightheadedness in standing, c/o groin pain and stating she cannot feel her legs.    Ambulation / Gait / Stairs / Wheelchair Mobility  Ambulation/Gait General Gait Details: unable    Posture / Balance Dynamic Sitting Balance Sitting balance - Comments: intiially minA progressing to close min guard, attempted dynamic reaching however pt with significant pain and unable to complete Balance Overall balance assessment: Needs assistance Sitting-balance support: Feet supported,No upper extremity supported Sitting balance-Leahy Scale: Fair Sitting balance - Comments: intiially minA progressing to close min guard, attempted dynamic reaching however pt with significant pain and unable to complete Postural control: Posterior lean Standing balance support: Bilateral upper extremity supported,During functional activity Standing balance-Leahy Scale: Zero Standing balance comment: dependent on external support/facilitation of upright posture    Special needs/care consideration Skin : surgical incision, and Designated visitor: daughter [place] (from acute therapy documentation) Living Arrangements: Alone Available Help at Discharge:  Family,Available PRN/intermittently Type of Home: Apartment Home Layout: One level Home Access: Level entry Bathroom Shower/Tub: Chiropodist: [place]: No  Discharge Living Setting Plans for Discharge Living Setting: Patient's home Type of Home at Discharge: Apartment Discharge Home Layout: One level Discharge Home Access: Level entry Discharge Bathroom Shower/Tub: Tub/shower unit Discharge Bathroom Toilet: Standard Does the patient have any problems obtaining your medications?: No  Social/Family/Support Systems Patient Roles: Other (Comment) Contact Information: [phone] Anticipated Caregiver: Alwyn Ren (daughter) plans to go stay  with Pt. At her home and take FMLA. She states that she will hire help if needed. Pt.'s sister Devra Dopp is also retired and willing to assist with care.  Anticipated Caregiver's Contact Information: [phone] Caregiver Availability: 24/7 Discharge Plan Discussed with Primary Caregiver: Yes Is Caregiver In Agreement with Plan?: Yes Does Caregiver/Family have Issues with Lodging/Transportation while Pt is in Rehab?: No  Goals Patient/Family Goal for Rehab: PT/OT Mod A Expected length of stay: 18-21 days Pt/Family Agrees to Admission and willing to participate: Yes Program Orientation Provided & Reviewed with Pt/Caregiver Including Roles  & Responsibilities: Yes  Decrease burden of Care through IP rehab admission: Specialzed equipment needs, Decrease number of caregivers and Patient/family education  Possible need for SNF placement upon discharge: not anticipated  Patient Condition: I have reviewed medical records from [facility], spoken with CM, and patient. I met with patient at the bedside for inpatient rehabilitation assessment.  Patient will benefit from ongoing PT and OT, can actively participate in 3 hours of therapy a day 5 days of the week, and can make measurable gains during the admission.  Patient will also benefit from the coordinated team approach during an Inpatient Acute Rehabilitation admission.  The patient will receive intensive therapy as well as Rehabilitation physician, nursing, social worker, and care management interventions.  Due to safety, skin/wound care, disease management, medication administration, pain management and patient education the patient requires 24 hour a day rehabilitation nursing.  The patient is currently ModA-Max+ 2 A  with mobility and basic ADLs.  Discharge setting and therapy post discharge at home with home health is anticipated.  Patient has agreed to participate in the Acute Inpatient Rehabilitation Program and will admit  today.  Preadmission Screen Completed By:  Genella Mech, 10/07/2020 10:12 AM ______________________________________________________________________   Discussed status with Dr. Dagoberto Ligas on 10/07/20 at 45 and received approval for admission today.  Admission Coordinator:  Genella Mech, CCC-SLP, time 930/Date 10/07/20   Assessment/Plan: Diagnosis: 1. Does the need for close, 24 hr/day Medical supervision in concert with the patient's rehab needs make it unreasonable for this patient to be served in a less intensive setting? Yes 2. Co-Morbidities requiring supervision/potential complications: HTN, THA, dense B/L LE paraparesis 3. Due to bladder management, bowel management, safety, skin/wound care, disease management, medication administration, pain management and patient education, does the patient require 24 hr/day rehab nursing? Yes 4. Does the patient require coordinated care of a physician, rehab nurse, PT, OT, and SLP to address physical and functional deficits in the context of the above medical diagnosis(es)? Yes Addressing deficits in the following areas: balance, endurance, locomotion, strength, transferring, bathing, dressing, feeding, grooming and toileting 5. Can the patient actively participate in an intensive therapy program of at least 3 hrs of therapy 5 days a week? Yes 6. The potential for patient to make measurable gains while on inpatient rehab is good 7. Anticipated functional outcomes upon discharge from inpatient rehab: min assist and mod  assist PT, min assist and mod assist OT, n/a SLP 8. Estimated rehab length of stay to reach the above functional goals is: 18-21 days 9. Anticipated discharge destination: Home 10. Overall Rehab/Functional Prognosis: good   MD Signature:

## 2020-10-05 NOTE — Progress Notes (Signed)
Subjective: Patient reports no significant pain  Objective: Vital signs in last 24 hours: Temp:  [97.9 F (36.6 C)-98.6 F (37 C)] 97.9 F (36.6 C) (01/16 0736) Pulse Rate:  [74-93] 93 (01/16 0736) Resp:  [16-18] 16 (01/16 0736) BP: (114-156)/(77-98) 138/88 (01/16 0736) SpO2:  [97 %-100 %] 100 % (01/16 0736)  Intake/Output from previous day: 01/15 0701 - 01/16 0700 In: 480 [P.O.:480] Out: 3700 [Urine:3700] Intake/Output this shift: Total I/O In: -  Out: 650 [Urine:650] NAD Dressing with some blood stain but dry Bilateral foot drop, 2/5 PF Foley in place  Lab Results: Recent Labs    10/02/20 1955  WBC 6.2  HGB 11.8*  HCT 36.6  PLT 298   BMET Recent Labs    10/02/20 1955  NA 140  K 2.9*  CL 101  CO2 26  GLUCOSE 189*  BUN 14  CREATININE 0.96  CALCIUM 9.2    Studies/Results: No results found.  Assessment/Plan: S/p L1-2 posterior decompression - cont PT/OT, likely to rehab - cont Foley   Bedelia Person 10/05/2020, 10:51 AM

## 2020-10-06 MED ORDER — HEPARIN SODIUM (PORCINE) 5000 UNIT/ML IJ SOLN
5000.0000 [IU] | Freq: Three times a day (TID) | INTRAMUSCULAR | Status: DC
  Administered 2020-10-06 – 2020-10-07 (×4): 5000 [IU] via SUBCUTANEOUS
  Filled 2020-10-06 (×4): qty 1

## 2020-10-06 NOTE — Progress Notes (Signed)
Inpatient Rehab Admissions Coordinator:   I spoke with pt. Over the phone and notified her that I opened a case with her insurance yesterday but do not yet have insurance authorization for CIR admit. I do not have a bed for her today, but will follow for potential admission later this week pending bed availability and insurance auth.   Megan Salon, MS, CCC-SLP Rehab Admissions Coordinator  [phone] (celll) [phone] (office)

## 2020-10-06 NOTE — Progress Notes (Signed)
Occupational Therapy Treatment Patient Details Name: Mary Davila MRN: [id number] DOB: 1956-03-09 Today's Date: 10/06/2020    History of present illness 65 y.o. female who underwent bilateral L1-L2 microdiscectomy on 1/13 at the outpatient surgical center who developed rather pronounced BLE weakness and new numbness/tingling involving the RLE. Admitted to [facility] for further workup and MRI. 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 s/p L1 laminectomy with complete facetectomy for decompression of the thecal sac 10/02/20. PMH including THA.   OT comments  Focus of session on bed mobility, sitting balance at EOB and sit<>stand using stedy. Pt with excellent participation. Reports pain in groin and lightheadness in standing, but BP once she returned to sitting not indicative of hypotension. Educated pt to avoid use of over head trapeze. Pt continues to be an excellent CIR candidate.   Follow Up Recommendations  CIR    Equipment Recommendations  3 in 1 bedside commode;Tub/shower seat;Wheelchair (measurements OT);Wheelchair cushion (measurements OT)    Recommendations for Other Services      Precautions / Restrictions Precautions Precautions: Back;Fall Precaution Booklet Issued: Yes (comment) Precaution Comments: Reviewed back precautions Required Braces or Orthoses:  (Per Dr Franky Macho no back brace needed) Other Brace: instructed pt not to use over bed trapeze Restrictions Weight Bearing Restrictions: No       Mobility Bed Mobility Overal bed mobility: Needs Assistance Bed Mobility: Rolling;Sidelying to Sit Rolling: Min assist Sidelying to sit: Mod assist       General bed mobility comments: assit for LE management for rolling, modA for trunk elevation and LE management to complete sit EOB  Transfers Overall transfer level: Needs assistance Equipment used: Ambulation equipment used Transfers: Sit to/from Stand Sit to Stand: Mod assist;+2 physical  assistance         General transfer comment: Completed from bed x 2 and from hip pads of stedy x 2 using bed pad to assist pt in raising hips. Facilitated upright posture in stedy. Pt reporting lightheadedness in standing, c/o groin pain and stating she cannot feel her legs.    Balance Overall balance assessment: Needs assistance Sitting-balance support: Feet supported;No upper extremity supported Sitting balance-Leahy Scale: Fair Sitting balance - Comments: intiially minA progressing to close min guard, attempted dynamic reaching however pt with significant pain and unable to complete   Standing balance support: Bilateral upper extremity supported;During functional activity Standing balance-Leahy Scale: Zero Standing balance comment: dependent on external support/facilitation of upright posture                           ADL either performed or assessed with clinical judgement   ADL                                               Vision       Perception     Praxis      Cognition Arousal/Alertness: Awake/alert Behavior During Therapy: Anxious Overall Cognitive Status: Within Functional Limits for tasks assessed                                          Exercises    Shoulder Instructions       General Comments pt with report of lightheadedness limiting ambulation tolerance  Pertinent Vitals/ Pain       Pain Assessment: Faces Pain Score: 9  Faces Pain Scale: Hurts even more Pain Location: groin Pain Descriptors / Indicators: Burning;Crying;Discomfort;Grimacing;Guarding Pain Intervention(s): Monitored during session;Repositioned  Home Living                                          Prior Functioning/Environment              Frequency  Min 2X/week        Progress Toward Goals  OT Goals(current goals can now be found in the care plan section)  Progress towards OT goals: Progressing  toward goals  Acute Rehab OT Goals Patient Stated Goal: go to rehab, stop the pain OT Goal Formulation: With patient Time For Goal Achievement: 10/17/20 Potential to Achieve Goals: Good  Plan Discharge plan remains appropriate    Co-evaluation      Reason for Co-Treatment: For patient/therapist safety PT goals addressed during session: Mobility/safety with mobility        AM-PAC OT "6 Clicks" Daily Activity     Outcome Measure   Help from another person eating meals?: None Help from another person taking care of personal grooming?: A Little Help from another person toileting, which includes using toliet, bedpan, or urinal?: Total Help from another person bathing (including washing, rinsing, drying)?: A Lot Help from another person to put on and taking off regular upper body clothing?: A Lot Help from another person to put on and taking off regular lower body clothing?: Total 6 Click Score: 13    End of Session    OT Visit Diagnosis: Unsteadiness on feet (R26.81);Other abnormalities of gait and mobility (R26.89);Muscle weakness (generalized) (M62.81);Pain;History of falling (Z91.81)   Activity Tolerance Patient tolerated treatment well   Patient Left with call bell/phone within reach;in chair   Nurse Communication Mobility status;Need for lift equipment        Time: 1125-1200 OT Time Calculation (min): 35 min  Charges: OT General Charges $OT Visit: 1 Visit OT Treatments $Therapeutic Activity: 8-22 mins  Martie Round, OTR/L Acute Rehabilitation Services Pager: [phone] Office: [phone]  Evern Bio 10/06/2020, 2:13 PM

## 2020-10-06 NOTE — Progress Notes (Signed)
Physical Therapy Treatment Patient Details Name: Mary Davila MRN: [id number] DOB: 08-23-1956 Today's Date: 10/06/2020    History of Present Illness 65 y.o. female who underwent bilateral L1-L2 microdiscectomy on 1/13 at the outpatient surgical center who developed rather pronounced BLE weakness and new numbness/tingling involving the RLE. Admitted to [facility] for further workup and MRI. 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 s/p L1 laminectomy with complete facetectomy for decompression of the thecal sac 10/02/20. PMH including THA.    PT Comments    Pt with report of significant groin pain s/p having BM in which she did not have the sensation she had one. Despite pain pt co-operated and participated well in PT/OT session. Pt's lightheadedness limiting standing ability. Recommend thigh high ted hose, Dr. Franky Macho agreed.  Pt also with bilat foot drop, recommend off the shelf AFO, Dr. Franky Macho also agreed to that. Ortho tech called. Pt remains appropriate for CIR upon d/c for maximal functional recovery. Acute PT to cont to follow.   Follow Up Recommendations  CIR;Supervision for mobility/OOB     Equipment Recommendations  Wheelchair (measurements PT);Wheelchair cushion (measurements PT);Hospital bed    Recommendations for Other Services       Precautions / Restrictions Precautions Precautions: Back;Fall Precaution Booklet Issued: Yes (comment) Precaution Comments: Reviewed back precautions Required Braces or Orthoses: Other Brace (per Dr. Alphonzo Lemmings pt does not need back brace) Other Brace: pt with over the head trapeze in room, instructed pt not to use due to causing arching back, Dr. Alphonzo Lemmings confirmed trapeze not appropriate for he Restrictions Weight Bearing Restrictions: No    Mobility  Bed Mobility Overal bed mobility: Needs Assistance Bed Mobility: Rolling;Sidelying to Sit Rolling: Min assist Sidelying to sit: Mod assist       General bed mobility  comments: assit for LE management for rolling, modA for trunk elevation and LE management to complete sit EOB  Transfers Overall transfer level: Needs assistance Equipment used: Ambulation equipment used Transfers: Sit to/from Stand Sit to Stand: Mod assist;+2 physical assistance         General transfer comment: pt modA x2 at posterior hips using bed pad, max tactile and verbal cues to extend elbows and stand up straight vs bending over at trunk over stedy. pt completed 3 sit to stands in which pt became very lightheaded at approx 1 min requiring sitting on EOB not just on stedy. BP taken and stable at 134/74. Pt reports "I can't feel my legs." Pt instructed to complete glut contraction, pt states 'I can't feel it"  Ambulation/Gait             General Gait Details: unable   Stairs             Wheelchair Mobility    Modified Rankin (Stroke Patients Only)       Balance Overall balance assessment: Needs assistance Sitting-balance support: Feet supported;No upper extremity supported Sitting balance-Leahy Scale: Fair Sitting balance - Comments: intiially minA progressing to close min guard, attempted dynamic reaching however pt with significant pain and unable to complete   Standing balance support: Bilateral upper extremity supported;During functional activity Standing balance-Leahy Scale: Zero Standing balance comment: dependent on external support                            Cognition Arousal/Alertness: Awake/alert Behavior During Therapy: WFL for tasks assessed/performed Overall Cognitive Status: Within Functional Limits for tasks assessed  Exercises General Exercises - Lower Extremity Long Arc Quad: AROM;Both;10 reps;Seated Hip Flexion/Marching: AROM;Both;5 reps;Seated (limited by groin pain)    General Comments General comments (skin integrity, edema, etc.): pt with report of  lightheadedness limiting ambulation tolerance      Pertinent Vitals/Pain Pain Assessment: 0-10 Pain Score: 9  Pain Location: groin Pain Descriptors / Indicators: Burning;Crying;Discomfort;Grimacing;Guarding Pain Intervention(s): Monitored during session    Home Living                      Prior Function            PT Goals (current goals can now be found in the care plan section) Acute Rehab PT Goals Patient Stated Goal: go to rehab, stop the pain Progress towards PT goals: Progressing toward goals    Frequency    Min 5X/week      PT Plan Current plan remains appropriate    Co-evaluation PT/OT/SLP Co-Evaluation/Treatment: Yes Reason for Co-Treatment: For patient/therapist safety PT goals addressed during session: Mobility/safety with mobility        AM-PAC PT "6 Clicks" Mobility   Outcome Measure  Help needed turning from your back to your side while in a flat bed without using bedrails?: A Lot Help needed moving from lying on your back to sitting on the side of a flat bed without using bedrails?: A Lot Help needed moving to and from a bed to a chair (including a wheelchair)?: Total Help needed standing up from a chair using your arms (e.g., wheelchair or bedside chair)?: Total Help needed to walk in hospital room?: Total   6 Click Score: 7    End of Session Equipment Utilized During Treatment: Gait belt;Back brace Activity Tolerance: Patient tolerated treatment well Patient left: in chair;with call bell/phone within reach Nurse Communication: Mobility status;Need for lift equipment PT Visit Diagnosis: Pain;Muscle weakness (generalized) (M62.81)     Time: 4098-1191 PT Time Calculation (min) (ACUTE ONLY): 39 min  Charges:  $Therapeutic Activity: 8-22 mins                     Lewis Shock, PT, DPT Acute Rehabilitation Services Pager #: [phone] Office #: [phone]    Mary Davila 10/06/2020, 1:20 PM

## 2020-10-06 NOTE — Progress Notes (Signed)
Patient ID: Mary Davila, female   DOB: 20-Aug-1956, 65 y.o.   MRN: [id number] BP 133/78 (BP Location: Right Arm)   Pulse 77   Temp 98.4 F (36.9 C) (Oral)   Resp 16   Ht 5\' 5"  (1.651 m)   Wt 73.6 kg   SpO2 99%   BMI 27.00 kg/m  Alert, oriented x 4, speech is clear + bowel movement, voiding. Reports no sensation of it occurring. Has quadriceps function, sensation is also improving.  Continues with PT, wound is clean and dry. No signs of infection.

## 2020-10-06 NOTE — Progress Notes (Signed)
Neurosurgery Service Progress Note  Subjective: No acute events overnight, sensation / strength subjectively slowly improving   Objective: Vitals:   10/05/20 1917 10/05/20 2338 10/06/20 0401 10/06/20 0811  BP: (!) 141/81 135/79 (!) 127/95 133/78  Pulse: 73 78 84 77  Resp: 18 18 18 16   Temp: 98.3 F (36.8 C) 98.7 F (37.1 C) 98 F (36.7 C) 98.4 F (36.9 C)  TempSrc: Oral Oral Oral Oral  SpO2: 97% 99% 100% 99%  Weight:      Height:        Physical Exam: Strength diffusely 1/5 in BLE, did have some flickers in the tib ant / EHL today which is an improvement, sensation diffusely decreased below inguinal ligament but sensation present Incision c/d/i  Assessment & Plan: 65 y.o. woman s/p lumbar lami / fusion.  -CIR pending, medically ready for transfer -SCDs/TEDs, start SQH  77  10/06/20 9:07 AM

## 2020-10-06 NOTE — Progress Notes (Signed)
Orthopedic Tech Progress Note Patient Details:  Mary Davila October 29, 1955 [id number] Ordered patient bilateral AFO Patient ID: Mary Davila, female   DOB: 05/02/1956, 65 y.o.   MRN: [id number]   Gerald Stabs 10/06/2020, 6:06 PM

## 2020-10-07 ENCOUNTER — Other Ambulatory Visit: Payer: Self-pay

## 2020-10-07 ENCOUNTER — Inpatient Hospital Stay (HOSPITAL_COMMUNITY): Payer: Medicare HMO

## 2020-10-07 ENCOUNTER — Encounter (HOSPITAL_COMMUNITY): Payer: Self-pay | Admitting: Neurosurgery

## 2020-10-07 ENCOUNTER — Inpatient Hospital Stay (HOSPITAL_COMMUNITY)
Admission: RE | Admit: 2020-10-07 | Discharge: 2020-11-07 | DRG: 560 | Disposition: A | Discharge status: Home Health | Payer: Medicare HMO | Source: Intra-hospital | Attending: Physical Medicine and Rehabilitation | Admitting: Physical Medicine and Rehabilitation

## 2020-10-07 DIAGNOSIS — R609 Edema, unspecified: Secondary | ICD-10-CM | POA: Diagnosis not present

## 2020-10-07 DIAGNOSIS — R509 Fever, unspecified: Secondary | ICD-10-CM | POA: Diagnosis not present

## 2020-10-07 DIAGNOSIS — Z809 Family history of malignant neoplasm, unspecified: Secondary | ICD-10-CM | POA: Diagnosis not present

## 2020-10-07 DIAGNOSIS — M1990 Unspecified osteoarthritis, unspecified site: Secondary | ICD-10-CM | POA: Diagnosis present

## 2020-10-07 DIAGNOSIS — D62 Acute posthemorrhagic anemia: Secondary | ICD-10-CM | POA: Diagnosis present

## 2020-10-07 DIAGNOSIS — G9581 Conus medullaris syndrome: Secondary | ICD-10-CM | POA: Diagnosis present

## 2020-10-07 DIAGNOSIS — E876 Hypokalemia: Secondary | ICD-10-CM | POA: Diagnosis present

## 2020-10-07 DIAGNOSIS — S24104D Unspecified injury at T11-T12 level of thoracic spinal cord, subsequent encounter: Secondary | ICD-10-CM

## 2020-10-07 DIAGNOSIS — F40240 Claustrophobia: Secondary | ICD-10-CM | POA: Diagnosis not present

## 2020-10-07 DIAGNOSIS — Z833 Family history of diabetes mellitus: Secondary | ICD-10-CM

## 2020-10-07 DIAGNOSIS — M21372 Foot drop, left foot: Secondary | ICD-10-CM | POA: Diagnosis present

## 2020-10-07 DIAGNOSIS — Z4789 Encounter for other orthopedic aftercare: Principal | ICD-10-CM

## 2020-10-07 DIAGNOSIS — Z7951 Long term (current) use of inhaled steroids: Secondary | ICD-10-CM

## 2020-10-07 DIAGNOSIS — R339 Retention of urine, unspecified: Secondary | ICD-10-CM | POA: Diagnosis present

## 2020-10-07 DIAGNOSIS — Z886 Allergy status to analgesic agent status: Secondary | ICD-10-CM

## 2020-10-07 DIAGNOSIS — R0602 Shortness of breath: Secondary | ICD-10-CM

## 2020-10-07 DIAGNOSIS — M5106 Intervertebral disc disorders with myelopathy, lumbar region: Secondary | ICD-10-CM | POA: Diagnosis present

## 2020-10-07 DIAGNOSIS — K592 Neurogenic bowel, not elsewhere classified: Secondary | ICD-10-CM | POA: Diagnosis present

## 2020-10-07 DIAGNOSIS — N319 Neuromuscular dysfunction of bladder, unspecified: Secondary | ICD-10-CM | POA: Diagnosis present

## 2020-10-07 DIAGNOSIS — Z823 Family history of stroke: Secondary | ICD-10-CM | POA: Diagnosis not present

## 2020-10-07 DIAGNOSIS — S24109A Unspecified injury at unspecified level of thoracic spinal cord, initial encounter: Secondary | ICD-10-CM

## 2020-10-07 DIAGNOSIS — Z96643 Presence of artificial hip joint, bilateral: Secondary | ICD-10-CM | POA: Diagnosis present

## 2020-10-07 DIAGNOSIS — G8918 Other acute postprocedural pain: Secondary | ICD-10-CM

## 2020-10-07 DIAGNOSIS — M7989 Other specified soft tissue disorders: Secondary | ICD-10-CM | POA: Diagnosis not present

## 2020-10-07 DIAGNOSIS — F32A Depression, unspecified: Secondary | ICD-10-CM | POA: Diagnosis present

## 2020-10-07 DIAGNOSIS — G629 Polyneuropathy, unspecified: Secondary | ICD-10-CM | POA: Diagnosis present

## 2020-10-07 DIAGNOSIS — G8222 Paraplegia, incomplete: Secondary | ICD-10-CM | POA: Diagnosis present

## 2020-10-07 DIAGNOSIS — Z888 Allergy status to other drugs, medicaments and biological substances status: Secondary | ICD-10-CM

## 2020-10-07 DIAGNOSIS — J449 Chronic obstructive pulmonary disease, unspecified: Secondary | ICD-10-CM | POA: Diagnosis present

## 2020-10-07 DIAGNOSIS — M21371 Foot drop, right foot: Secondary | ICD-10-CM | POA: Diagnosis present

## 2020-10-07 DIAGNOSIS — M62838 Other muscle spasm: Secondary | ICD-10-CM

## 2020-10-07 DIAGNOSIS — Z79899 Other long term (current) drug therapy: Secondary | ICD-10-CM

## 2020-10-07 DIAGNOSIS — I10 Essential (primary) hypertension: Secondary | ICD-10-CM | POA: Diagnosis present

## 2020-10-07 DIAGNOSIS — I951 Orthostatic hypotension: Secondary | ICD-10-CM | POA: Diagnosis not present

## 2020-10-07 DIAGNOSIS — Z981 Arthrodesis status: Secondary | ICD-10-CM | POA: Diagnosis not present

## 2020-10-07 DIAGNOSIS — K5900 Constipation, unspecified: Secondary | ICD-10-CM

## 2020-10-07 DIAGNOSIS — Z885 Allergy status to narcotic agent status: Secondary | ICD-10-CM

## 2020-10-07 LAB — CBC WITH DIFFERENTIAL/PLATELET
Abs Immature Granulocytes: 0.02 10*3/uL (ref 0.00–0.07)
Basophils Absolute: 0 10*3/uL (ref 0.0–0.1)
Basophils Relative: 0 %
Eosinophils Absolute: 0.2 10*3/uL (ref 0.0–0.5)
Eosinophils Relative: 2 %
HCT: 34.8 % — ABNORMAL LOW (ref 36.0–46.0)
Hemoglobin: 11.7 g/dL — ABNORMAL LOW (ref 12.0–15.0)
Immature Granulocytes: 0 %
Lymphocytes Relative: 23 %
Lymphs Abs: 1.7 10*3/uL (ref 0.7–4.0)
MCH: 30.5 pg (ref 26.0–34.0)
MCHC: 33.6 g/dL (ref 30.0–36.0)
MCV: 90.6 fL (ref 80.0–100.0)
Monocytes Absolute: 0.9 10*3/uL (ref 0.1–1.0)
Monocytes Relative: 12 %
Neutro Abs: 4.7 10*3/uL (ref 1.7–7.7)
Neutrophils Relative %: 63 %
Platelets: 346 10*3/uL (ref 150–400)
RBC: 3.84 MIL/uL — ABNORMAL LOW (ref 3.87–5.11)
RDW: 14.3 % (ref 11.5–15.5)
WBC: 7.5 10*3/uL (ref 4.0–10.5)
nRBC: 0 % (ref 0.0–0.2)

## 2020-10-07 LAB — COMPREHENSIVE METABOLIC PANEL
ALT: 24 U/L (ref 0–44)
AST: 19 U/L (ref 15–41)
Albumin: 3 g/dL — ABNORMAL LOW (ref 3.5–5.0)
Alkaline Phosphatase: 58 U/L (ref 38–126)
Anion gap: 11 (ref 5–15)
BUN: 19 mg/dL (ref 8–23)
CO2: 28 mmol/L (ref 22–32)
Calcium: 9 mg/dL (ref 8.9–10.3)
Chloride: 100 mmol/L (ref 98–111)
Creatinine, Ser: 1.07 mg/dL — ABNORMAL HIGH (ref 0.44–1.00)
GFR, Estimated: 58 mL/min — ABNORMAL LOW (ref >60)
Glucose, Bld: 127 mg/dL — ABNORMAL HIGH (ref 70–99)
Potassium: 4.3 mmol/L (ref 3.5–5.1)
Sodium: 139 mmol/L (ref 135–145)
Total Bilirubin: 0.4 mg/dL (ref 0.3–1.2)
Total Protein: 6.7 g/dL (ref 6.5–8.1)

## 2020-10-07 IMAGING — DX DG ABDOMEN 1V
1 series · 1 of 1 positions shown · non-contrast
Comparison: None.

CLINICAL DATA: Constipation

EXAM:
ABDOMEN - 1 VIEW

[abdomen kub]
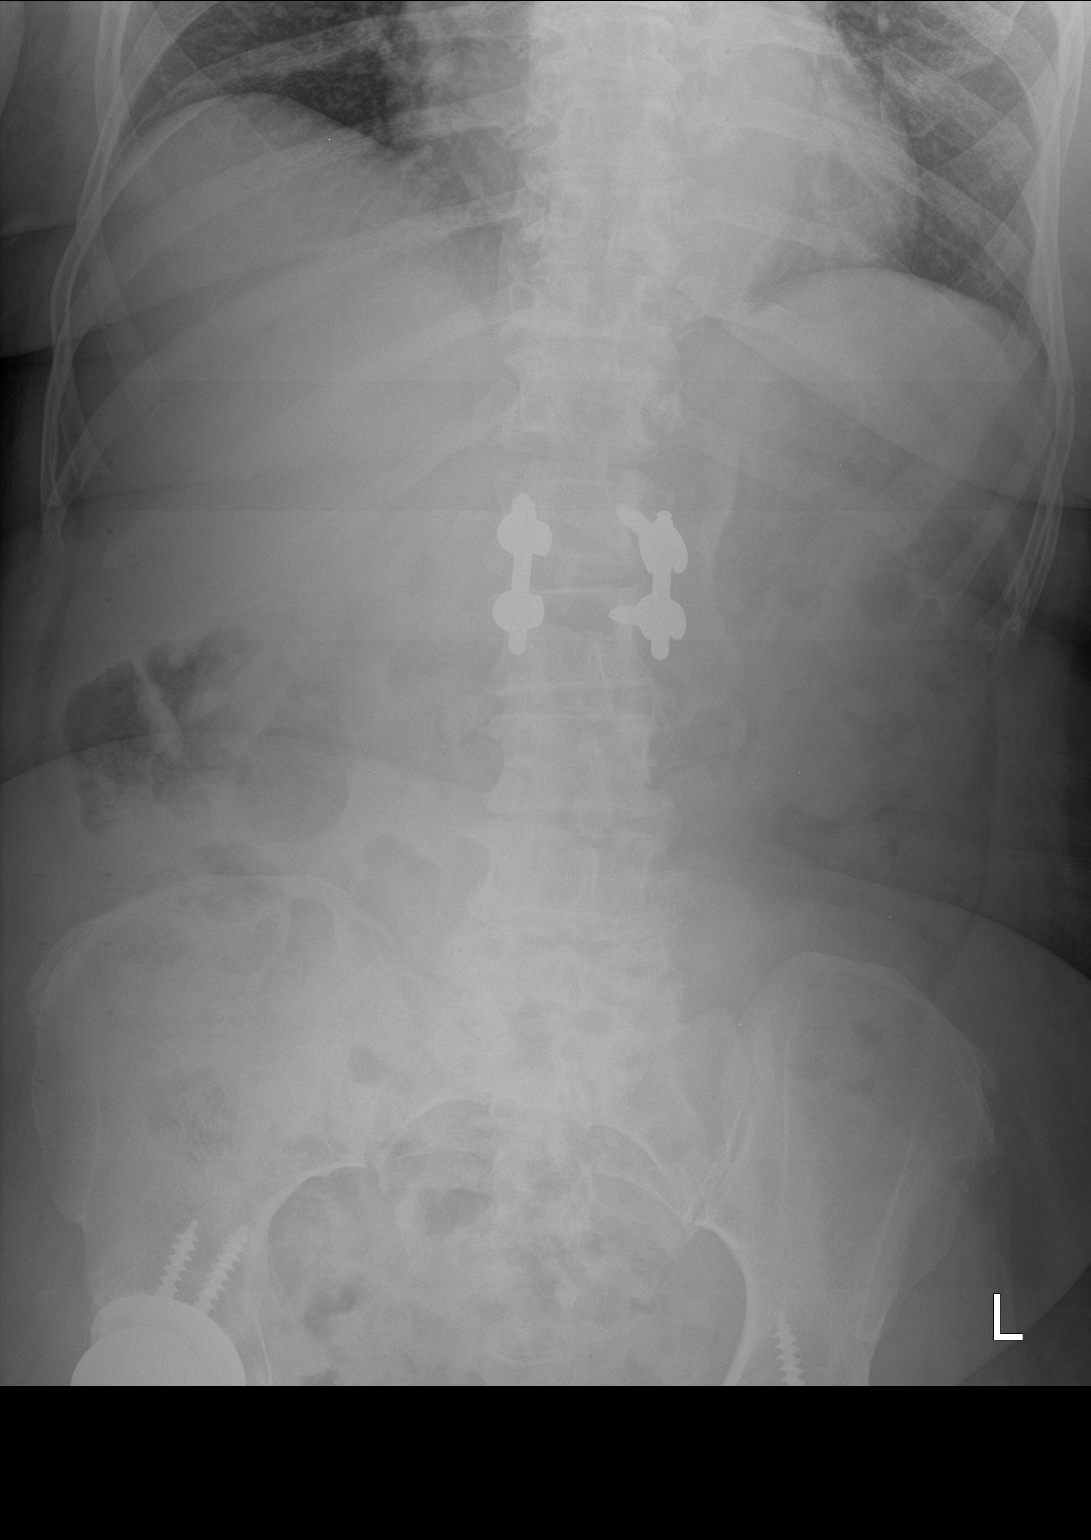

[1 of 1 positions shown; findings below may reference images not displayed]

FINDINGS: Scattered large and small bowel gas is noted. Scattered fecal
material is noted throughout the colon without obstructive change.
No free air is noted. Postsurgical changes in the lumbar spine and
hip joints are seen.
IMPRESSION: Mild retained fecal material without obstructive change.

## 2020-10-07 MED ORDER — ZOLPIDEM TARTRATE 5 MG PO TABS
5.0000 mg | ORAL_TABLET | Freq: Every day | ORAL | Status: DC
  Administered 2020-10-07 – 2020-11-06 (×30): 5 mg via ORAL
  Filled 2020-10-07 (×30): qty 1

## 2020-10-07 MED ORDER — SENNOSIDES-DOCUSATE SODIUM 8.6-50 MG PO TABS
2.0000 | ORAL_TABLET | Freq: Every day | ORAL | Status: DC
  Administered 2020-10-08 – 2020-10-21 (×14): 2 via ORAL
  Filled 2020-10-07 (×14): qty 2

## 2020-10-07 MED ORDER — HYDROCODONE-ACETAMINOPHEN 5-325 MG PO TABS
1.0000 | ORAL_TABLET | ORAL | Status: DC | PRN
  Administered 2020-10-07 – 2020-10-08 (×5): 2 via ORAL
  Filled 2020-10-07 (×5): qty 2

## 2020-10-07 MED ORDER — LORATADINE 10 MG PO TABS: 10.0000 mg | ORAL_TABLET | Freq: Every day | ORAL | Status: DC | PRN

## 2020-10-07 MED ORDER — METHOCARBAMOL 500 MG PO TABS: 500.0000 mg | ORAL_TABLET | Freq: Four times a day (QID) | ORAL | Status: DC | PRN

## 2020-10-07 MED ORDER — DULOXETINE HCL 20 MG PO CPEP: 20.0000 mg | ORAL_CAPSULE | Freq: Every day | ORAL | Status: DC

## 2020-10-07 MED ORDER — ALUM & MAG HYDROXIDE-SIMETH 200-200-20 MG/5ML PO SUSP
30.0000 mL | ORAL | Status: DC | PRN
Start: 2020-10-07 — End: 2020-11-07

## 2020-10-07 MED ORDER — BISACODYL 10 MG RE SUPP: 10.0000 mg | Freq: Every day | RECTAL | Status: DC | PRN

## 2020-10-07 MED ORDER — PROCHLORPERAZINE 25 MG RE SUPP: 12.5000 mg | Freq: Four times a day (QID) | RECTAL | Status: DC | PRN

## 2020-10-07 MED ORDER — GUAIFENESIN-DM 100-10 MG/5ML PO SYRP: 5.0000 mL | ORAL_SOLUTION | Freq: Four times a day (QID) | ORAL | Status: DC | PRN

## 2020-10-07 MED ORDER — MOMETASONE FURO-FORMOTEROL FUM 200-5 MCG/ACT IN AERO
2.0000 | INHALATION_SPRAY | Freq: Two times a day (BID) | RESPIRATORY_TRACT | Status: DC
  Administered 2020-10-07 – 2020-11-07 (×59): 2 via RESPIRATORY_TRACT
  Filled 2020-10-07: qty 8.8

## 2020-10-07 MED ORDER — DIPHENHYDRAMINE HCL 12.5 MG/5ML PO ELIX: 12.5000 mg | ORAL_SOLUTION | Freq: Four times a day (QID) | ORAL | Status: DC | PRN

## 2020-10-07 MED ORDER — TRAZODONE HCL 50 MG PO TABS: 25.0000 mg | ORAL_TABLET | Freq: Every evening | ORAL | Status: DC | PRN

## 2020-10-07 MED ORDER — FLUTICASONE PROPIONATE 50 MCG/ACT NA SUSP
2.0000 | Freq: Every day | NASAL | Status: DC | PRN
  Filled 2020-10-07: qty 16

## 2020-10-07 MED ORDER — GUAIFENESIN ER 600 MG PO TB12
600.0000 mg | ORAL_TABLET | Freq: Two times a day (BID) | ORAL | Status: DC
  Administered 2020-10-07 – 2020-10-20 (×26): 600 mg via ORAL
  Filled 2020-10-07 (×29): qty 1

## 2020-10-07 MED ORDER — ACETAMINOPHEN 325 MG PO TABS
325.0000 mg | ORAL_TABLET | ORAL | Status: DC | PRN
  Administered 2020-10-08 – 2020-10-11 (×11): 650 mg via ORAL
  Administered 2020-10-11: 325 mg via ORAL
  Administered 2020-10-11 – 2020-11-07 (×53): 650 mg via ORAL
  Filled 2020-10-07 (×47): qty 2
  Filled 2020-10-07: qty 1
  Filled 2020-10-07 (×21): qty 2

## 2020-10-07 MED ORDER — FLEET ENEMA 7-19 GM/118ML RE ENEM: 1.0000 | ENEMA | Freq: Once | RECTAL | Status: DC | PRN

## 2020-10-07 MED ORDER — POTASSIUM CHLORIDE CRYS ER 20 MEQ PO TBCR
40.0000 meq | EXTENDED_RELEASE_TABLET | Freq: Two times a day (BID) | ORAL | Status: DC
  Administered 2020-10-07 – 2020-11-07 (×55): 40 meq via ORAL
  Filled 2020-10-07 (×62): qty 2

## 2020-10-07 MED ORDER — GABAPENTIN 300 MG PO CAPS
300.0000 mg | ORAL_CAPSULE | Freq: Three times a day (TID) | ORAL | Status: DC
  Administered 2020-10-07 – 2020-10-20 (×38): 300 mg via ORAL
  Filled 2020-10-07 (×41): qty 1

## 2020-10-07 MED ORDER — LIDOCAINE HCL URETHRAL/MUCOSAL 2 % EX GEL: 1.0000 "application " | CUTANEOUS | Status: DC | PRN

## 2020-10-07 MED ORDER — MENTHOL 3 MG MT LOZG
1.0000 | LOZENGE | OROMUCOSAL | Status: DC | PRN
Start: 2020-10-07 — End: 2020-11-07

## 2020-10-07 MED ORDER — POLYETHYLENE GLYCOL 3350 17 G PO PACK
17.0000 g | PACK | Freq: Every day | ORAL | Status: DC
  Administered 2020-10-08 – 2020-11-05 (×23): 17 g via ORAL
  Filled 2020-10-07 (×28): qty 1

## 2020-10-07 MED ORDER — PHENOL 1.4 % MT LIQD
1.0000 | OROMUCOSAL | Status: DC | PRN
Start: 2020-10-07 — End: 2020-11-07

## 2020-10-07 MED ORDER — ALBUTEROL SULFATE HFA 108 (90 BASE) MCG/ACT IN AERS
2.0000 | INHALATION_SPRAY | Freq: Four times a day (QID) | RESPIRATORY_TRACT | Status: DC | PRN
  Administered 2020-10-21: 2 via RESPIRATORY_TRACT
  Filled 2020-10-07: qty 6.7

## 2020-10-07 MED ORDER — FAMOTIDINE 20 MG PO TABS
20.0000 mg | ORAL_TABLET | Freq: Two times a day (BID) | ORAL | Status: DC
  Administered 2020-10-07 – 2020-11-07 (×62): 20 mg via ORAL
  Filled 2020-10-07 (×63): qty 1

## 2020-10-07 MED ORDER — HEPARIN SODIUM (PORCINE) 5000 UNIT/ML IJ SOLN
5000.0000 [IU] | Freq: Three times a day (TID) | INTRAMUSCULAR | Status: DC
  Administered 2020-10-07 – 2020-10-09 (×5): 5000 [IU] via SUBCUTANEOUS
  Filled 2020-10-07 (×6): qty 1

## 2020-10-07 MED ORDER — PROCHLORPERAZINE EDISYLATE 10 MG/2ML IJ SOLN: 5.0000 mg | Freq: Four times a day (QID) | INTRAMUSCULAR | Status: DC | PRN

## 2020-10-07 MED ORDER — POLYETHYLENE GLYCOL 3350 17 G PO PACK
17.0000 g | PACK | Freq: Every day | ORAL | Status: DC | PRN
  Administered 2020-10-22: 17 g via ORAL
  Filled 2020-10-07: qty 1

## 2020-10-07 MED ORDER — AMLODIPINE BESYLATE 5 MG PO TABS: 5.0000 mg | ORAL_TABLET | Freq: Every day | ORAL | Status: DC

## 2020-10-07 MED ORDER — PROCHLORPERAZINE MALEATE 5 MG PO TABS
5.0000 mg | ORAL_TABLET | Freq: Four times a day (QID) | ORAL | Status: DC | PRN
  Filled 2020-10-07: qty 2

## 2020-10-07 MED ORDER — BISACODYL 10 MG RE SUPP
10.0000 mg | Freq: Every day | RECTAL | Status: DC
  Administered 2020-10-07 – 2020-10-20 (×12): 10 mg via RECTAL
  Filled 2020-10-07 (×15): qty 1

## 2020-10-07 MED ORDER — CYCLOBENZAPRINE HCL 10 MG PO TABS
10.0000 mg | ORAL_TABLET | Freq: Three times a day (TID) | ORAL | Status: DC | PRN
  Administered 2020-10-07 – 2020-10-19 (×17): 10 mg via ORAL
  Filled 2020-10-07 (×18): qty 1

## 2020-10-07 MED FILL — Thrombin For Soln 5000 Unit: CUTANEOUS | Qty: 5000 | Status: AC

## 2020-10-07 NOTE — Progress Notes (Signed)
Patient discharged to for rehab per order. Patient alert and oriented with no c/o pain or distress. Patient was given a bath prior to discharged. Patient also had a Large BM with no knowledge of it d/t incontinency. Patient's belongings was taking along side patient when transfer to 4MW11.

## 2020-10-07 NOTE — Progress Notes (Signed)
Orthopedic Tech Progress Note Patient Details:  Mary Davila 1956/08/12 [id number] [facility] boots requested by RN Ortho Devices Type of Ortho Device: Prafo boot/shoe Ortho Device/Splint Location: Bilateral Lower Extremity Ortho Device/Splint Interventions: Ordered,Application   Post Interventions Patient Tolerated: Well Instructions Provided: Adjustment of device,Care of device,Poper ambulation with device   [name] Clarene Reamer 10/07/2020, 8:21 PM

## 2020-10-07 NOTE — Progress Notes (Signed)
Physical Therapy Treatment Patient Details Name: Mary Davila MRN: [id number] DOB: 11-Feb-1956 Today's Date: 10/07/2020    History of Present Illness 65 y.o. female who underwent bilateral L1-L2 microdiscectomy on 1/13 at the outpatient surgical center who developed rather pronounced BLE weakness and new numbness/tingling involving the RLE. Admitted to [facility] for further workup and MRI. 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 s/p L1 laminectomy with complete facetectomy for decompression of the thecal sac 10/02/20. PMH including THA.    PT Comments    Pt continues with significant groin pain and zero sensation in bilat LEs requiring maxA for all mobility and OOB transfers. Pt is able to complete bilat LE LAQ however no heel slides or DF. Pt unable to stand upright in stedy today despite maxA and tactile cues at shoulders and hips. Continue to recommend CIR upon d/c for maximal functional recovery. Scott from [place] present and discussed bilat off the shelf AFO and we instructed her to have family bring in her tennis shoes. Pt attempted to call dtr to tell her however unable to get a hold of her. Acute PT to cont to follow.    Follow Up Recommendations  CIR;Supervision for mobility/OOB     Equipment Recommendations  Wheelchair (measurements PT);Wheelchair cushion (measurements PT);Hospital bed    Recommendations for Other Services       Precautions / Restrictions Precautions Precautions: Back;Fall Precaution Booklet Issued: Yes (comment) Precaution Comments: Reviewed back precautions Required Braces or Orthoses:  (Per Dr Franky Macho no back brace needed) Other Brace: instructed pt not to use over bed trapeze Restrictions Weight Bearing Restrictions: No    Mobility  Bed Mobility Overal bed mobility: Needs Assistance Bed Mobility: Rolling;Sidelying to Sit Rolling: Mod assist Sidelying to sit: Mod assist       General bed mobility comments: max directional  verbal cues to adhere to precautions, maxA for LE mangement, modA for trunk elevation, increased time  Transfers Overall transfer level: Needs assistance Equipment used: Ambulation equipment used Transfers: Sit to/from Stand Sit to Stand: Mod assist;+2 physical assistance         General transfer comment: Completed from bed x 2 and from hip pads of stedy x 2 using bed pad to assist pt in raising hips. Facilitated upright posture in stedy. Pt reporting lightheadedness in standing, c/o groin pain and stating she cannot feel her legs. completed 3 trials, unable to get completely upright today like yesterday despite tactile and verbal cues in addition to posterior hip support  Ambulation/Gait             General Gait Details: unable   Stairs             Wheelchair Mobility    Modified Rankin (Stroke Patients Only)       Balance Overall balance assessment: Needs assistance Sitting-balance support: Feet supported;No upper extremity supported Sitting balance-Leahy Scale: Poor Sitting balance - Comments: due to pain pt unable to hold EOB balance without UE support today Postural control: Left lateral lean Standing balance support: Bilateral upper extremity supported;During functional activity Standing balance-Leahy Scale: Zero Standing balance comment: dependent on external support/facilitation of upright posture                            Cognition Arousal/Alertness: Awake/alert Behavior During Therapy: Anxious Overall Cognitive Status: Within Functional Limits for tasks assessed  General Comments: pt continues to be emotional regarding pain and situation, pt did appear to be confused while talking with [employer] regarding bilat AFO. Pt would say one thing and follow up with the opposite. ie. 'Yes I have sneakers at home." "I'll have to go buy some because I dont have any."      Exercises General Exercises -  Lower Extremity Long Arc Quad: AROM;Both;10 reps;Seated Hip Flexion/Marching: AROM;Both;5 reps;Seated (limited by groin pain)    General Comments General comments (skin integrity, edema, etc.): dressing not saturated today      Pertinent Vitals/Pain Pain Assessment: Faces Faces Pain Scale: Hurts even more Pain Location: groin Pain Descriptors / Indicators: Burning;Crying;Discomfort;Grimacing;Guarding Pain Intervention(s): Monitored during session    Home Living                      Prior Function            PT Goals (current goals can now be found in the care plan section) Acute Rehab PT Goals PT Goal Formulation: With patient Time For Goal Achievement: 10/17/20 Potential to Achieve Goals: Fair Progress towards PT goals: Progressing toward goals    Frequency    Min 5X/week      PT Plan Current plan remains appropriate    Co-evaluation              AM-PAC PT "6 Clicks" Mobility   Outcome Measure  Help needed turning from your back to your side while in a flat bed without using bedrails?: A Lot Help needed moving from lying on your back to sitting on the side of a flat bed without using bedrails?: A Lot Help needed moving to and from a bed to a chair (including a wheelchair)?: Total Help needed standing up from a chair using your arms (e.g., wheelchair or bedside chair)?: Total Help needed to walk in hospital room?: Total Help needed climbing 3-5 steps with a railing? : Total 6 Click Score: 8    End of Session Equipment Utilized During Treatment: Gait belt;Back brace Activity Tolerance: Patient tolerated treatment well Patient left: in chair;with call bell/phone within reach Nurse Communication: Mobility status;Need for lift equipment PT Visit Diagnosis: Pain;Muscle weakness (generalized) (M62.81) Pain - part of body:  (back)     Time: 3383-2919 PT Time Calculation (min) (ACUTE ONLY): 32 min  Charges:  $Therapeutic Exercise: 8-22  mins $Therapeutic Activity: 8-22 mins                     Lewis Shock, PT, DPT Acute Rehabilitation Services Pager #: [phone] Office #: [phone]    Iona Hansen 10/07/2020, 1:10 PM

## 2020-10-07 NOTE — Progress Notes (Signed)
Pt requested to remove PRAFO boots, feels uncomfortable and hot. Pt was educated, but still wants them to be removed. Boots removed.

## 2020-10-07 NOTE — Progress Notes (Signed)
Pt admitted to room 4M11. Oriented to floor, call bell and rehab fall policy. No further questions at this time. Pt resting in bed with all need within reach.   Marylu Lund, RN

## 2020-10-07 NOTE — H&P (Shared)
Physical Medicine and Rehabilitation Admission H&P    CC: Functional deficits   HPI: Mary Davila is a 65 year old female with history of HTN, OA/DDD who underwent L1-L2 microdisectomy at outpatient surgery on 10/03/19  but developed significant weakness BLE post op with numbness/tingling RLE. She was transferred to [facility] for work up. MRI spine showed large central disc extrusion with mass effect on spinal cord,  now with severe left L4/L5 foraminal stenosis and incidental note of massively distended urinary bladder. Patient with dense paraplegia and symptoms felt to be due to recurrent/residual migration of disc at L1 with edema. She was taken back to OR for L1 laminectomy with complete facetectomy for decompression of thecal sac on 01/14 by Dr. Conchita Paris.   Post op continues to be limited by BLE weakness with foot drop, lacks sensation BLE, dizziness with standing attempts in [place] as well as groin pain with neuropathy. Foley in place and no documented BM. Therapy has been ongoing and work on balance at EOB. She continues to have impairment in mobility and ADLs. CIR recommended due to functional decline.    Review of Systems  Constitutional: Negative for chills and fever.  HENT: Negative for hearing loss and tinnitus.   Eyes: Negative for blurred vision and double vision.  Respiratory: Negative for cough and shortness of breath.   Cardiovascular: Negative for chest pain and palpitations.  Gastrointestinal: Positive for constipation. Negative for heartburn and nausea.  Genitourinary: Positive for frequency.       Was getting up 6+ times per day PTA  Musculoskeletal: Positive for joint pain and myalgias.  Skin: Negative for rash.  Neurological: Positive for sensory change (burning periarea. BLE feel heavy/numb) and focal weakness. Negative for dizziness and headaches.  Psychiatric/Behavioral: The patient is not nervous/anxious.      Past Medical History:  Diagnosis Date  . Arthritis    . Asthma   . Constipation   . Hypertension   . Nocturia     Past Surgical History:  Procedure Laterality Date  . ABDOMINAL HYSTERECTOMY    . BACK SURGERY    . EYE SURGERY    . JOINT REPLACEMENT Bilateral    bilateral hip replaced    Family History  Problem Relation Age of Onset  . Stroke Mother   . Diabetes Mother   . Bone cancer Brother      Social History: Lives alone. Used to work as a Lawyer. Has bene disabled since hip replacements. She has been using a walker X 3 months due to recent falls. She   reports that she has never smoked. She has never used smokeless tobacco. No history on file for alcohol use and drug use.    Allergies  Allergen Reactions  . Celebrex [Celecoxib]     Caused pain  . Lisinopril Swelling    Facial swelling  . Meloxicam Swelling    Facial swelling  . Metronidazole     Unknown reaction  . Nabumetone     Unknown reaction  . Nebivolol     Unknown reaction  . Tramadol     Caused pain     Medications Prior to Admission  Medication Sig Dispense Refill  . albuterol (VENTOLIN HFA) 108 (90 Base) MCG/ACT inhaler Inhale 2 puffs into the lungs every 6 (six) hours as needed for wheezing or shortness of breath.    Marland Kitchen amLODipine (NORVASC) 10 MG tablet Take 10 mg by mouth daily.    Marland Kitchen aspirin-acetaminophen-caffeine (EXCEDRIN EXTRA STRENGTH) [phone] MG  tablet Take 2 tablets by mouth 2 (two) times daily.    . budesonide-formoterol (SYMBICORT) 160-4.5 MCG/ACT inhaler Inhale 2 puffs into the lungs 2 (two) times daily.    . cetirizine (ZYRTEC) 10 MG tablet Take 10 mg by mouth daily.    . Diclofenac Sodium CR 100 MG 24 hr tablet Take 100 mg by mouth daily.    . famotidine (PEPCID) 20 MG tablet Take 20 mg by mouth 2 (two) times daily.    . fluticasone (FLONASE) 50 MCG/ACT nasal spray Place 2 sprays into both nostrils daily.    Marland Kitchen gabapentin (NEURONTIN) 300 MG capsule Take 600 mg by mouth 2 (two) times daily.    . polyethylene glycol (MIRALAX / GLYCOLAX) 17 g  packet Take 17 g by mouth daily.    . potassium chloride SA (KLOR-CON) 20 MEQ tablet Take 40 mEq by mouth 2 (two) times daily.    Marland Kitchen zolpidem (AMBIEN) 5 MG tablet Take 5 mg by mouth at bedtime.      Drug Regimen Review { DRUG REGIMEN [id number]}  Home: Home Living Family/patient expects to be discharged to:: Private residence Living Arrangements: Alone Available Help at Discharge: Family,Available PRN/intermittently Type of Home: Apartment Home Access: Level entry Home Layout: One level Bathroom Shower/Tub: Engineer, manufacturing systems: Standard Home Equipment: Walker - 2 wheels,Cane - single point   Functional History: Prior Function Level of Independence: Independent with assistive device(s) Comments: Was using a SPC and then RW for mobility since recent falls. Does not drive.  Functional Status:  Mobility: Bed Mobility Overal bed mobility: Needs Assistance Bed Mobility: Rolling,Sidelying to Sit Rolling: Min assist Sidelying to sit: Mod assist Sit to sidelying: Mod assist,+2 for physical assistance,+2 for safety/equipment General bed mobility comments: assit for LE management for rolling, modA for trunk elevation and LE management to complete sit EOB Transfers Overall transfer level: Needs assistance Equipment used: Ambulation equipment used Transfer via Lift Equipment: Stedy Transfers: Sit to/from Stand Sit to Stand: Mod assist,+2 physical assistance General transfer comment: Completed from bed x 2 and from hip pads of stedy x 2 using bed pad to assist pt in raising hips. Facilitated upright posture in stedy. Pt reporting lightheadedness in standing, c/o groin pain and stating she cannot feel her legs. Ambulation/Gait General Gait Details: unable    ADL: ADL Overall ADL's : Needs assistance/impaired Eating/Feeding: Set up,Bed level Grooming: Set up,Bed level Upper Body Bathing: Moderate assistance,Sitting,Bed level,Maximal assistance Upper Body Bathing  Details (indicate cue type and reason): Pt requiring Max A for support while sitting due to pain at back. Pending support, pt able to perform UB bathing with Mod A Lower Body Bathing: Total assistance,Bed level Upper Body Dressing : Maximal assistance,Sitting,Bed level Lower Body Dressing: Total assistance,Bed level Toilet Transfer: Total assistance General ADL Comments: Pt limited by significant pain at back and poor functional use of BLEs  Cognition: Cognition Overall Cognitive Status: Within Functional Limits for tasks assessed Orientation Level: Oriented X4 Cognition Arousal/Alertness: Awake/alert Behavior During Therapy: Anxious Overall Cognitive Status: Within Functional Limits for tasks assessed General Comments: Pt. was cooperative during session   Blood pressure 128/71, pulse 69, temperature 97.8 F (36.6 C), temperature source Oral, resp. rate 16, height 5\' 5"  (1.651 m), weight 73.6 kg, SpO2 100 %. Physical Exam Nursing note reviewed.  Constitutional:      Appearance: Normal appearance.  Skin:    General: Skin is dry.     Comments: Back incision with dry dressing.   Neurological:  Mental Status: She is oriented to person, place, and time.     No results found for this or any previous visit (from the past 48 hour(s)). No results found.     Medical Problem List and Plan: 1.  *** secondary to ***  -patient may *** shower  -ELOS/Goals: *** 2.  Antithrombotics: -DVT/anticoagulation:  Pharmaceutical: Heparin  -antiplatelet therapy: N/a 3. Pain Management: Currently using hydrocodone with robaxin prn.  ---Continue Neurontin 300 mg tid for neuropathy 4. Mood: LCSW to follow for evaluation and support.   -antipsychotic agents:  N/A 5. Neuropsych: This patient is capable of making decisions on her own behalf. 6. Skin/Wound Care: Monitor wound for healing.  7. Fluids/Electrolytes/Nutrition: Monitor I/O. Check lytes in am.  8.  HTN: Monitor BP tid--Continue  Norvasc. Monitor for orthostasis.  9. Hypokalemia: Preadmission labs showed K+2.9. ON Kdur 40 meq bid--check follow up labs today. Fluids d/c yesterday.  10. Mild anemia: Recheck CBC for post op labs--likely has ABLA.   11.  COPD: Respiratory status stable on Dulera bid. Albuterol prn SOB.  12. Neurogenic bladder:  Will d/c foley in am and start bladder training.  13. Neurogenic bowel: Has had constipation for past few months (goes once a week). She had multiple BMs (hard balls -->liquid) yesterday after mag citrate. Will order KUB to check stool burden.   Will continue Senna S with miralax daily. Schedule suppository after supper.         ***  Jacquelynn Cree, PA-C 10/07/2020

## 2020-10-07 NOTE — H&P (Signed)
Physical Medicine and Rehabilitation Admission H&P    CC: Functional deficits   HPI: Mary Davila is a 65 year old female with history of HTN, OA/DDD who underwent L1-L2 microdisectomy at outpatient surgery on 10/03/19  but developed significant weakness BLE post op with numbness/tingling RLE. She was transferred to [facility] for work up. MRI spine showed large central disc extrusion with mass effect on spinal cord,  now with severe left L4/L5 foraminal stenosis and incidental note of massively distended urinary bladder. Patient with dense paraplegia and symptoms felt to be due to recurrent/residual migration of disc at L1 with edema. She was taken back to OR for L1 laminectomy with complete facetectomy for decompression of thecal sac on 01/14 by Dr. Conchita Paris.   Post op continues to be limited by BLE weakness with foot drop, lacks sensation BLE, dizziness with standing attempts in [place] as well as groin pain with neuropathy. Foley in place and no documented BM. Therapy has been ongoing and work on balance at [facility]. She continues to have impairment in mobility and ADLs. CIR recommended due to functional decline.   Pt reports having pulling/muscle tightness/pain in groin- concerned due to THA's she's had- sounds like it's so tight, it's likely at level SCI pain.  Has been having bowel accidents- no sensation of having bowel movements.  Has foley due to urinary retention.  Also feeling dizzy/lightheaded with sitting/standing/transfers with therapy- sounds like having orthostatic hypotension.      Review of Systems  Constitutional: Negative for chills and fever.  HENT: Negative for hearing loss and tinnitus.   Eyes: Negative for blurred vision and double vision.  Respiratory: Negative for cough and shortness of breath.   Cardiovascular: Negative for chest pain and palpitations.  Gastrointestinal: Positive for constipation. Negative for heartburn and nausea.  Genitourinary: Positive for  frequency.       Was getting up 6+ times per day PTA  Musculoskeletal: Positive for joint pain and myalgias.  Skin: Negative for rash.  Neurological: Positive for sensory change (burning periarea. BLE feel heavy/numb) and focal weakness. Negative for dizziness and headaches.  Psychiatric/Behavioral: The patient is not nervous/anxious.   All other systems reviewed and are negative.    Past Medical History:  Diagnosis Date  . Arthritis   . Asthma   . Constipation   . Hypertension   . Nocturia     Past Surgical History:  Procedure Laterality Date  . ABDOMINAL HYSTERECTOMY    . BACK SURGERY    . EYE SURGERY    . JOINT REPLACEMENT Bilateral    bilateral hip replaced    Family History  Problem Relation Age of Onset  . Stroke Mother   . Diabetes Mother   . Bone cancer Brother      Social History: Lives alone. Used to work as a Lawyer. Has bene disabled since hip replacements. She has been using a walker X 3 months due to recent falls. She   reports that she has never smoked. She has never used smokeless tobacco. No history on file for alcohol use and drug use.    Allergies  Allergen Reactions  . Celebrex [Celecoxib]     Caused pain  . Lisinopril Swelling    Facial swelling  . Meloxicam Swelling    Facial swelling  . Metronidazole     Unknown reaction  . Nabumetone     Unknown reaction  . Nebivolol     Unknown reaction  . Tramadol     Caused  pain     Medications Prior to Admission  Medication Sig Dispense Refill  . albuterol (VENTOLIN HFA) 108 (90 Base) MCG/ACT inhaler Inhale 2 puffs into the lungs every 6 (six) hours as needed for wheezing or shortness of breath.    Marland Kitchen amLODipine (NORVASC) 10 MG tablet Take 10 mg by mouth daily.    Marland Kitchen aspirin-acetaminophen-caffeine (EXCEDRIN EXTRA STRENGTH) 250-250-65 MG tablet Take 2 tablets by mouth 2 (two) times daily.    . budesonide-formoterol (SYMBICORT) 160-4.5 MCG/ACT inhaler Inhale 2 puffs into the lungs 2 (two) times daily.     . cetirizine (ZYRTEC) 10 MG tablet Take 10 mg by mouth daily.    . Diclofenac Sodium CR 100 MG 24 hr tablet Take 100 mg by mouth daily.    . famotidine (PEPCID) 20 MG tablet Take 20 mg by mouth 2 (two) times daily.    . fluticasone (FLONASE) 50 MCG/ACT nasal spray Place 2 sprays into both nostrils daily.    Marland Kitchen gabapentin (NEURONTIN) 300 MG capsule Take 600 mg by mouth 2 (two) times daily.    . polyethylene glycol (MIRALAX / GLYCOLAX) 17 g packet Take 17 g by mouth daily.    . potassium chloride SA (KLOR-CON) 20 MEQ tablet Take 40 mEq by mouth 2 (two) times daily.    Marland Kitchen zolpidem (AMBIEN) 5 MG tablet Take 5 mg by mouth at bedtime.      Drug Regimen Review  Drug regimen was reviewed and remains appropriate with no significant issues identified  Home:     Functional History:    Functional Status:  Mobility:          ADL:    Cognition: Cognition Orientation Level: Oriented X4     Blood pressure 125/69, pulse 81, temperature 98.7 F (37.1 C), temperature source Oral, resp. rate 18, height 5' 5.5" (1.664 m), weight 72.9 kg, SpO2 97 %. Physical Exam Nursing note reviewed.  Constitutional:      Appearance: Normal appearance.     Comments: Pt awake, alert, in bedside chair, depressed, near tears affect, NAD  HENT:     Head: Normocephalic and atraumatic.     Comments: Smile equal    Right Ear: External ear normal.     Left Ear: External ear normal.     Nose: Nose normal. No congestion.     Mouth/Throat:     Mouth: Mucous membranes are moist.     Pharynx: Oropharynx is clear. No oropharyngeal exudate.  Eyes:     General:        Right eye: No discharge.        Left eye: No discharge.     Extraocular Movements: Extraocular movements intact.  Cardiovascular:     Rate and Rhythm: Normal rate and regular rhythm.     Heart sounds: Normal heart sounds. No murmur heard. No gallop.   Pulmonary:     Comments: CTA B/L- no W/R/R- good air movement Abdominal:     Comments: Soft,  NT, ND, (+)BS -hypoactive  Genitourinary:    Comments: Has foley- amber urine Musculoskeletal:     Cervical back: Normal range of motion. No rigidity.     Comments: UE's 5/5 in deltoid, biceps, triceps, WE grip and finger abd LE's RLE- HF 2-/5, KE 3+/5, DF and PF 0/5 LLE- HF 1/5, KE 3+/5, DF and PF 0/5  Skin:    General: Skin is dry.     Comments: Back incision with dry dressing.  No drainage noted  Neurological:  Mental Status: She is oriented to person, place, and time.     Comments: Sensation decreased at T12  To S5- dramatic reduction in sensation in T11/12 but when eyes closed, wasn't sure where I was touching her from L1-S1 nor S2-S5 B/L No hoffman's B/L No increased tone at this time B/L foot drop as above  Psychiatric:     Comments: Depressed- near tears affect     Results for orders placed or performed during the hospital encounter of 10/07/20 (from the past 48 hour(s))  CBC WITH DIFFERENTIAL     Status: Abnormal   Collection Time: 10/07/20  4:20 PM  Result Value Ref Range   WBC 7.5 4.0 - 10.5 K/uL   RBC 3.84 (L) 3.87 - 5.11 MIL/uL   Hemoglobin 11.7 (L) 12.0 - 15.0 g/dL   HCT 16.134.8 (L) 09.636.0 - 04.546.0 %   MCV 90.6 80.0 - 100.0 fL   MCH 30.5 26.0 - 34.0 pg   MCHC 33.6 30.0 - 36.0 g/dL   RDW 40.914.3 81.111.5 - 91.415.5 %   Platelets 346 150 - 400 K/uL   nRBC 0.0 0.0 - 0.2 %   Neutrophils Relative % 63 %   Neutro Abs 4.7 1.7 - 7.7 K/uL   Lymphocytes Relative 23 %   Lymphs Abs 1.7 0.7 - 4.0 K/uL   Monocytes Relative 12 %   Monocytes Absolute 0.9 0.1 - 1.0 K/uL   Eosinophils Relative 2 %   Eosinophils Absolute 0.2 0.0 - 0.5 K/uL   Basophils Relative 0 %   Basophils Absolute 0.0 0.0 - 0.1 K/uL   Immature Granulocytes 0 %   Abs Immature Granulocytes 0.02 0.00 - 0.07 K/uL    Comment: Performed at [facility], [address] [place], [place] [address]  Comprehensive metabolic panel     Status: Abnormal   Collection Time: 10/07/20  4:20 PM  Result Value Ref Range    Sodium 139 135 - 145 mmol/L   Potassium 4.3 3.5 - 5.1 mmol/L   Chloride 100 98 - 111 mmol/L   CO2 28 22 - 32 mmol/L   Glucose, Bld 127 (H) 70 - 99 mg/dL    Comment: Glucose reference range applies only to samples taken after fasting for at least 8 hours.   BUN 19 8 - 23 mg/dL   Creatinine, Ser 6.211.07 (H) 0.44 - 1.00 mg/dL   Calcium 9.0 8.9 - 30.810.3 mg/dL   Total Protein 6.7 6.5 - 8.1 g/dL   Albumin 3.0 (L) 3.5 - 5.0 g/dL   AST 19 15 - 41 U/L   ALT 24 0 - 44 U/L   Alkaline Phosphatase 58 38 - 126 U/L   Total Bilirubin 0.4 0.3 - 1.2 mg/dL   GFR, Estimated 58 (L) >60 mL/min    Comment: (NOTE) Calculated using the CKD-EPI Creatinine Equation (2021)    Anion gap 11 5 - 15    Comment: Performed at [facility], [address] [place], [place] [address]   No results found.     Medical Problem List and Plan: 1.  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  Antithrombotics: -DVT/anticoagulation:  Pharmaceutical: Heparin  -antiplatelet therapy: N/a 3. Pain Management: Currently using hydrocodone with flexeril prn- changed from robaxin, since wasn't working  ---Continue Neurontin 300 mg tid for neuropathy but added Cymbalta 20 mg QHS for nerve  pain.  4. Mood: LCSW to follow for evaluation and support.   -antipsychotic agents:  N/A 5. Neuropsych: This patient is capable of making decisions on her own behalf. 6. Skin/Wound Care: Monitor wound for healing.  7. Fluids/Electrolytes/Nutrition: Monitor I/O. Check lytes in am.  8.  HTN: Monitor BP tid--having HYPOTENSION per therapy notes and pt- will stop Norvasc 5 mg daily and see if does ok- might need midodrine for hypotension  9. Hypokalemia: Preadmission labs showed K+2.9. ON Kdur 40 meq bid--check follow up labs today. Fluids d/c yesterday.  10. Mild anemia: Recheck CBC for post op labs--likely  has ABLA.   11.  COPD: Respiratory status stable on Dulera bid. Albuterol prn SOB.  12. Neurogenic bladder:  Will d/c foley  In a couple of days- right now cannot tolerate Flomax due to low BP issues- will give it a few days, since having to start to deal with bowel program today- will remove Friday vs Monday-  13. Neurogenic bowel: Has had constipation for past few months (goes once a week). She had multiple BMs (hard balls -->liquid) yesterday after mag citrate. Will order KUB to check stool burden.   Will continue Senna S with miralax daily. Schedule suppository after supper.     - will make sure we start bowel program this evening- discussed at length with pt about bowel program.  14. At level SCI pain- starting Cymbalta 20 mg QHS and will titrate up. Already on gabapentin but not working so far.   Jacquelynn Cree- PA-C 10/07/2020   I have personally performed a face to face diagnostic evaluation of this patient and formulated the key components of the plan.  Additionally, I have personally reviewed laboratory data, imaging studies, as well as relevant notes and concur with the physician assistant's documentation above.    I spent a total of 100 minutes on admission today- >50% on coordination of care- 20 minutes chart review- was in room with pt for 60 minutes going over SCI and plans for bowel and bladder and discussed prognosis/recovery.  Also helping pt with emotional aspects of this.  Then spent 15 minutes talking with SCI/rehab nurse about plans for med changes, and 25 minutes doing H&P.   Genice Rouge, MD 10/07/2020

## 2020-10-07 NOTE — Progress Notes (Signed)
Pt finished eating dinner at 1840 and bowel program started at 1850. Dig Stim done x2. A very small amount of soft, mushy stool removed with dig stim. Dulcolax suppository inserted. No results at this time. Reported off to oncoming staff to follow up. Pt tolerated well. Pt asking questions and engaging.   Pt reports spasms that are painful in groin/lower abdominal area especially with turning and repositioning. PRN pain med given 45 mins prior to bowel program which was NOT effective.  Marylu Lund, RN

## 2020-10-07 NOTE — Progress Notes (Signed)
Inpatient Rehab Admissions Coordinator:    I have a CIR bed for this Pt. Today. I have notified pt, reviewed insurance information, and received necessary consent. RN may call [phone] for report.  Elzora Cullins, MS, CCC-SLP Rehab Admissions Coordinator  [phone] (celll) [phone] (office)  

## 2020-10-07 NOTE — Progress Notes (Signed)
Inpatient Rehabilitation Medication Review by a Pharmacist  A complete drug regimen review was completed for this patient to identify any potential clinically significant medication issues.  Clinically significant medication issues were identified:  Yes  Type of Medication Issue Identified Description of Issue Urgent (address now) Non-Urgent (address on AM team rounds) Plan Plan Accepted by Provider? (Yes / No / Pending AM Rounds)  Drug Interaction(s) (clinically significant)       Duplicate Therapy       Allergy       No Medication Administration End Date       Incorrect Dose       Additional Drug Therapy Needed  Albuterol inhaler PRN was listed on pt's discharge summary from [facility], as well as in CIR admission note, but has not been ordered yet (pt also on this med PTA, per home med list) Non urgent Pending review by CIR team on AM rounds Pending AM rounds  Other  The following meds were listed on pt's discharge med list from [facility], but were not ordered on admission to CIR (these were on pt's home med list, but pt did not receive these at [facility]): Diclofenac tablet Excedrin Extra-Strength tablet Non urgent Pending review by CIR team on AM rounds Pending AM rounds    Name of provider notified for urgent issues identified:  N/A  For non-urgent medication issues to be resolved on team rounds tomorrow morning a [facility] Secure Chat Handoff was sent to:   Delle Reining, PA  Time spent performing this drug regimen review (minutes):  15  Vicki Mallet, PharmD, BCPS, [facility] Clinical Pharmacist 10/07/2020 4:13 PM

## 2020-10-07 NOTE — Progress Notes (Signed)
Bowel program completed. Pt was placed on L site. Pt had BM on her brief.Dig stim was done, more Stool came out. Pt had medium BM, brown color, type 6. Pt tolerated well. Peri care provided.

## 2020-10-07 NOTE — Progress Notes (Signed)
Orthopedic Tech Progress Note Patient Details:  Mary Davila 22-Jun-1956 [id number] Spoke with RN about patient not having her TLSO BRACE. Was ordered when patient was on 3C03 but nothing now that she is on . Calling hanger as well as 3C Patient ID: [name], female   DOB: April 30, 1956, 65 y.o.   MRN: [id number]   Donald Pore 10/07/2020, 6:25 PM

## 2020-10-07 NOTE — Progress Notes (Signed)
PMR Admission Coordinator Pre-Admission Assessment  Patient: Mary Davila is an 65 y.o., female MRN: [id number] DOB: August 24, 1956 Height: 5' 5"  (165.1 cm) Weight: 73.6 kg   Insurance Information HMO: yes    PPO:      PCP:      IPA:      80/20:      OTHER:  [employer] HMO      Policy#: [id number]      Subscriber: Pt CM Name:  Lestine Box   Phone#: [phone]    Fax#: [phone]  I received authorization 10/06/20 for admission 10/07/2020 with 5 day window to admit. CM Kierra requests team conference notes be sent to her for d/c planning.  Pre-Cert#: [id number]       Employer: n/a Benefits:  Phone #: opened online with availity portal     Name:  Eff. Date:  09/20/2020 - Present Deductible: does not have for in-network providers OOP Max: $3,900 ($20 met) CIR: $295/day co-pay for days 1-6, $0/day co-pay for days 7-90 SNF:  $0/day co-pay for days 1-20, $188/day co-pay for days 21-100; $0 co-pay 22-100 limited to 100 days/cal yr Outpatient:  $10-40 per visit Home Health:  $0 co-pay, 100% coverage, 0% co-insurance; limited by medical necessity DME: 80% coverage, 20% co-insurance  SECONDARY:  n/a      Financial Counselor:       Phone#:   The "Data Collection Information Summary" for patients in Inpatient Rehabilitation Facilities with attached "[place] Records" was provided and verbally reviewed with: Patient  Emergency Contact Information         Contact Information    Name Relation Home Work Mobile   [place] Daughter   [place], [place] Sister   [phone]      Current Medical History   Patient Admitting Diagnosis: s/p spinal decompression History of Present Illness: Mary Davila a 65 y.o.female with history significant for THA who underwent bilateral L1-L2 microdiscectomy 10/03/20 at the outpatient spinal  surgical center and then developed pronounced BLE weakness and new  numbness/tingling involving the RLE. Because of the severity of her symptoms, the decision was made to admit her to [facility] for admission and further work up of her symptoms.  Pre op she reports have severe primarily left leg pain and bilateral foot weakness. Post op, new distal RLE pain, N/T and inability to move BLE.Was using a cane prior to surgery for assistance with ambulation, but now unablele to stand. Pt. 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 CIR was consulted to assist Pt. In regaining PLOF.   Patient's medical record from [facility]  has been reviewed by the rehabilitation admission coordinator and physician.  Past Medical History      Past Medical History:  Diagnosis Date  . Arthritis   . Hypertension     Family History   family history is not on file.  Prior Rehab/Hospitalizations Has the patient had prior rehab or hospitalizations prior to admission? No  Has the patient had major surgery during 100 days prior to admission? Yes             Current Medications  Current Facility-Administered Medications:  .  0.9 %  sodium chloride infusion, , Intravenous, Continuous, Costella, Vista Mink, PA-C, Last Rate: 75 mL/hr at 10/03/20 0553, New Bag at 10/03/20 0553 .  acetaminophen (TYLENOL) tablet 650 mg, 650 mg, Oral, Q4H PRN **OR** acetaminophen (TYLENOL) suppository 650 mg, 650 mg, Rectal,  Q4H PRN, Costella, Vista Mink, PA-C .  amLODipine (NORVASC) tablet 10 mg, 10 mg, Oral, Daily, Costella, Vincent J, PA-C, 10 mg at 10/07/20 0825 .  bisacodyl (DULCOLAX) suppository 10 mg, 10 mg, Rectal, Daily PRN, Costella, Vincent J, PA-C, 10 mg at 10/06/20 0934 .  Chlorhexidine Gluconate Cloth 2 % PADS 6 each, 6 each, Topical, Daily, Ashok Pall, MD, 6 each at 10/07/20 [phone] .  docusate sodium (COLACE) capsule 100 mg, 100 mg, Oral, BID, Costella, Vincent J, PA-C, 100 mg at 10/07/20 0825 .  famotidine (PEPCID) tablet 20 mg, 20 mg, Oral, BID, Costella,  Vincent J, PA-C, 20 mg at 10/07/20 0825 .  fluticasone (FLONASE) 50 MCG/ACT nasal spray 2 spray, 2 spray, Each Nare, Daily PRN, Costella, Vincent J, PA-C .  gabapentin (NEURONTIN) capsule 300 mg, 300 mg, Oral, TID, Ashok Pall, MD, 300 mg at 10/07/20 0825 .  heparin injection 5,000 Units, 5,000 Units, Subcutaneous, Q8H, Judith Part, MD, 5,000 Units at 10/07/20 0529 .  HYDROcodone-acetaminophen (NORCO/VICODIN) 5-325 MG per tablet 1-2 tablet, 1-2 tablet, Oral, Q4H PRN, Ashok Pall, MD, 2 tablet at 10/07/20 0552 .  HYDROmorphone (DILAUDID) injection 0.5-1 mg, 0.5-1 mg, Intravenous, Q2H PRN, Costella, Vincent J, PA-C, 1 mg at 10/03/20 0114 .  loratadine (CLARITIN) tablet 10 mg, 10 mg, Oral, Daily PRN, Costella, Vincent J, PA-C .  menthol-cetylpyridinium (CEPACOL) lozenge 3 mg, 1 lozenge, Oral, PRN **OR** phenol (CHLORASEPTIC) mouth spray 1 spray, 1 spray, Mouth/Throat, PRN, Costella, Vincent J, PA-C .  methocarbamol (ROBAXIN) tablet 500 mg, 500 mg, Oral, Q6H PRN, 500 mg at 10/07/20 0201 **OR** methocarbamol (ROBAXIN) 500 mg in dextrose 5 % 50 mL IVPB, 500 mg, Intravenous, Q6H PRN, Costella, Vincent J, PA-C .  mometasone-formoterol (DULERA) 200-5 MCG/ACT inhaler 2 puff, 2 puff, Inhalation, BID, Costella, Vista Mink, PA-C, 2 puff at 10/07/20 0802 .  ondansetron (ZOFRAN) tablet 4 mg, 4 mg, Oral, Q6H PRN **OR** ondansetron (ZOFRAN) injection 4 mg, 4 mg, Intravenous, Q6H PRN, Costella, Vincent J, PA-C .  oxyCODONE (Oxy IR/ROXICODONE) immediate release tablet 5-10 mg, 5-10 mg, Oral, Q3H PRN, Costella, Vincent J, PA-C .  polyethylene glycol (MIRALAX / GLYCOLAX) packet 17 g, 17 g, Oral, Daily, Costella, Vincent J, PA-C, 17 g at 10/06/20 1053 .  potassium chloride SA (KLOR-CON) CR tablet 40 mEq, 40 mEq, Oral, BID, Costella, Vincent Lenna Sciara, PA-C, 40 mEq at 10/07/20 0824 .  senna (SENOKOT) tablet 8.6 mg, 1 tablet, Oral, BID, Costella, Vincent J, PA-C, 8.6 mg at 10/06/20 2044 .  senna-docusate (Senokot-S)  tablet 1 tablet, 1 tablet, Oral, QHS PRN, Costella, Vincent J, PA-C .  sodium phosphate (FLEET) 7-19 GM/118ML enema 1 enema, 1 enema, Rectal, Once PRN, Costella, Vista Mink, PA-C .  zolpidem (AMBIEN) tablet 5 mg, 5 mg, Oral, QHS, Costella, Vincent J, PA-C, 5 mg at 10/06/20 2044  Patients Current Diet:     Diet Order                  Diet regular Room service appropriate? Yes; Fluid consistency: Thin  Diet effective now                  Precautions / Restrictions Precautions Precautions: Back,Fall Precaution Booklet Issued: Yes (comment) Precaution Comments: Reviewed back precautions Spinal Brace: Thoracolumbosacral orthotic,Applied in sitting position Other Brace: instructed pt not to use over bed trapeze Restrictions Weight Bearing Restrictions: No   Has the patient had 2 or more falls or a fall with injury in the past year? Yes  Prior  Activity Level  Prior Functional Level Self Care: Did the patient need help bathing, dressing, using the toilet or eating? Independent  Indoor Mobility: Did the patient need assistance with walking from room to room (with or without device)? Independent  Stairs: Did the patient need assistance with internal or external stairs (with or without device)? Independent  Functional Cognition: Did the patient need help planning regular tasks such as shopping or remembering to take medications? [place] / Equipment Home Assistive Devices/Equipment: Environmental consultant (specify type),Eyeglasses,Cane (specify quad or straight) Home Equipment: Walker - 2 wheels,Cane - single point  Prior Device Use: Indicate devices/aids used by the patient prior to current illness, exacerbation or injury? None of the above  Current Functional Level Cognition  Overall Cognitive Status: Within Functional Limits for tasks assessed Orientation Level: Oriented X4 General Comments: Pt. was cooperative during session    Extremity  Assessment (includes Sensation/Coordination)  Upper Extremity Assessment: Generalized weakness  Lower Extremity Assessment: RLE deficits/detail,LLE deficits/detail RLE Deficits / Details: No ankle AROM, able to wiggle toes with increased time. Grossly ~2+/5 knee extension, 2-/5 hip flexion, 1/5 hamstrings RLE Sensation: decreased light touch (inconsistent; worse distally on plantar aspect of foot but able to feel medically on ankle) LLE Deficits / Details: No ankle AROM, not able to wiggle toes; Grossly ~2+/5 knee extension, 2-/5 hip flexion, 1/5 hamstrings LLE Sensation: decreased light touch (inconsistent; worse distally on plantar aspect of foot but able to feel medically on ankle)    ADLs  Overall ADL's : Needs assistance/impaired Eating/Feeding: Set up,Bed level Grooming: Set up,Bed level Upper Body Bathing: Moderate assistance,Sitting,Bed level,Maximal assistance Upper Body Bathing Details (indicate cue type and reason): Pt requiring Max A for support while sitting due to pain at back. Pending support, pt able to perform UB bathing with Mod A Lower Body Bathing: Total assistance,Bed level Upper Body Dressing : Maximal assistance,Sitting,Bed level Lower Body Dressing: Total assistance,Bed level Toilet Transfer: Total assistance General ADL Comments: Pt limited by significant pain at back and poor functional use of BLEs    Mobility  Overal bed mobility: Needs Assistance Bed Mobility: Rolling,Sidelying to Sit Rolling: Min assist Sidelying to sit: Mod assist Sit to sidelying: Mod assist,+2 for physical assistance,+2 for safety/equipment General bed mobility comments: assit for LE management for rolling, modA for trunk elevation and LE management to complete sit EOB    Transfers  Overall transfer level: Needs assistance Equipment used: Ambulation equipment used Transfer via Lift Equipment: Stedy Transfers: Sit to/from Stand Sit to Stand: Mod assist,+2 physical  assistance General transfer comment: Completed from bed x 2 and from hip pads of stedy x 2 using bed pad to assist pt in raising hips. Facilitated upright posture in stedy. Pt reporting lightheadedness in standing, c/o groin pain and stating she cannot feel her legs.    Ambulation / Gait / Stairs / Wheelchair Mobility  Ambulation/Gait General Gait Details: unable    Posture / Balance Dynamic Sitting Balance Sitting balance - Comments: intiially minA progressing to close min guard, attempted dynamic reaching however pt with significant pain and unable to complete Balance Overall balance assessment: Needs assistance Sitting-balance support: Feet supported,No upper extremity supported Sitting balance-Leahy Scale: Fair Sitting balance - Comments: intiially minA progressing to close min guard, attempted dynamic reaching however pt with significant pain and unable to complete Postural control: Posterior lean Standing balance support: Bilateral upper extremity supported,During functional activity Standing balance-Leahy Scale: Zero Standing balance comment: dependent on external support/facilitation of upright posture  Special needs/care consideration Skin : surgical incision, and Designated visitor: daughter Alwyn Ren   Previous Home Environment (from acute therapy documentation) Living Arrangements: Alone Available Help at Discharge: Family,Available PRN/intermittently Type of Home: Apartment Home Layout: One level Home Access: Level entry Bathroom Shower/Tub: Chiropodist: [place]: No  Discharge Living Setting Plans for Discharge Living Setting: Patient's home Type of Home at Discharge: Apartment Discharge Home Layout: One level Discharge Home Access: Level entry Discharge Bathroom Shower/Tub: Tub/shower unit Discharge Bathroom Toilet: Standard Does the patient have any problems obtaining your medications?: No  Social/Family/Support  Systems Patient Roles: Other (Comment) Contact Information: [phone] Anticipated Caregiver: Alwyn Ren (daughter) plans to go stay with Pt. At her home and take FMLA. She states that she will hire help if needed. Pt.'s sister Devra Dopp is also retired and willing to assist with care.  Anticipated Caregiver's Contact Information: [phone] Caregiver Availability: 24/7 Discharge Plan Discussed with Primary Caregiver: Yes Is Caregiver In Agreement with Plan?: Yes Does Caregiver/Family have Issues with Lodging/Transportation while Pt is in Rehab?: No  Goals Patient/Family Goal for Rehab: PT/OT Mod A Expected length of stay: 18-21 days Pt/Family Agrees to Admission and willing to participate: Yes Program Orientation Provided & Reviewed with Pt/Caregiver Including Roles  & Responsibilities: Yes  Decrease burden of Care through IP rehab admission: Specialzed equipment needs, Decrease number of caregivers and Patient/family education  Possible need for SNF placement upon discharge: not anticipated  Patient Condition: I have reviewed medical records from [facility], spoken with CM, and patient. I met with patient at the bedside for inpatient rehabilitation assessment.  Patient will benefit from ongoing PT and OT, can actively participate in 3 hours of therapy a day 5 days of the week, and can make measurable gains during the admission.  Patient will also benefit from the coordinated team approach during an Inpatient Acute Rehabilitation admission.  The patient will receive intensive therapy as well as Rehabilitation physician, nursing, social worker, and care management interventions.  Due to safety, skin/wound care, disease management, medication administration, pain management and patient education the patient requires 24 hour a day rehabilitation nursing.  The patient is currently ModA-Max+ 2 A  with mobility and basic ADLs.  Discharge setting and therapy post discharge at home  with home health is anticipated.  Patient has agreed to participate in the Acute Inpatient Rehabilitation Program and will admit today.  Preadmission Screen Completed By:  Genella Mech, 10/07/2020 10:12 AM ______________________________________________________________________   Discussed status with Dr. Dagoberto Ligas on 10/07/20 at 69 and received approval for admission today.  Admission Coordinator:  Genella Mech, CCC-SLP, time 930/Date 10/07/20   Assessment/Plan: Diagnosis: 1. Does the need for close, 24 hr/day Medical supervision in concert with the patient's rehab needs make it unreasonable for this patient to be served in a less intensive setting? Yes 2. Co-Morbidities requiring supervision/potential complications: HTN, THA, dense B/L LE paraparesis 3. Due to bladder management, bowel management, safety, skin/wound care, disease management, medication administration, pain management and patient education, does the patient require 24 hr/day rehab nursing? Yes 4. Does the patient require coordinated care of a physician, rehab nurse, PT, OT, and SLP to address physical and functional deficits in the context of the above medical diagnosis(es)? Yes Addressing deficits in the following areas: balance, endurance, locomotion, strength, transferring, bathing, dressing, feeding, grooming and toileting 5. Can the patient actively participate in an intensive therapy program of at least 3 hrs of therapy 5 days a week? Yes  6. The potential for patient to make measurable gains while on inpatient rehab is good 7. Anticipated functional outcomes upon discharge from inpatient rehab: min assist and mod assist PT, min assist and mod assist OT, n/a SLP 8. Estimated rehab length of stay to reach the above functional goals is: 18-21 days 9. Anticipated discharge destination: Home 10. Overall Rehab/Functional Prognosis: good   MD Signature:

## 2020-10-07 NOTE — Discharge Instructions (Signed)
May be up without brace

## 2020-10-07 NOTE — Discharge Summary (Signed)
Physician Discharge Summary  Patient ID: Mary Davila MRN: [id number] DOB/AGE: March 30, 1956 65 y.o.  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 [facility] showed high signal in the conus. Redo surgery removed some of the remaining disc. She was fused with pedicle screw fixation at the one level. Her lower extremity strength has improved since admission.   Treatments: surgery: as above  Discharge Exam: Blood pressure 130/80, pulse 91, temperature 98.9 F (37.2 C), resp. rate 16, height 5\' 5"  (1.651 m), weight 73.6 kg, SpO2 100 %. General appearance: alert, cooperative and mild distress poor dorsiflexion 1/5, plantarflexion 1/5, hamstrings 1/5, quads 3/5, hip flexors 3/5 extensors 3/5 Wound is clean, dry, no signs of infection. Has had bowel movement, and voids. States she has no control over sphincters  Disposition: Discharge disposition: 70-Another Health Care Institution Not Defined      herniated disc  Allergies as of 10/07/2020      Reactions   Celebrex [celecoxib]    Caused pain   Lisinopril Swelling   Facial swelling   Meloxicam Swelling   Facial swelling   Metronidazole    Unknown reaction   Nabumetone    Unknown reaction   Nebivolol    Unknown reaction   Tramadol    Caused pain       Medication List    TAKE these medications   albuterol 108 (90 Base) MCG/ACT inhaler Commonly known as: VENTOLIN HFA Inhale 2 puffs into the lungs every 6 (six) hours as needed for wheezing or shortness of breath.   amLODipine 10 MG tablet Commonly known as: NORVASC Take 10 mg by mouth daily.   budesonide-formoterol 160-4.5 MCG/ACT inhaler Commonly known as:  SYMBICORT Inhale 2 puffs into the lungs 2 (two) times daily.   cetirizine 10 MG tablet Commonly known as: ZYRTEC Take 10 mg by mouth daily.   Diclofenac Sodium CR 100 MG 24 hr tablet Take 100 mg by mouth daily.   Excedrin Extra Strength 250-250-65 MG tablet Generic drug: aspirin-acetaminophen-caffeine Take 2 tablets by mouth 2 (two) times daily.   famotidine 20 MG tablet Commonly known as: PEPCID Take 20 mg by mouth 2 (two) times daily.   fluticasone 50 MCG/ACT nasal spray Commonly known as: FLONASE Place 2 sprays into both nostrils daily.   gabapentin 300 MG capsule Commonly known as: NEURONTIN Take 600 mg by mouth 2 (two) times daily.   polyethylene glycol 17 g packet Commonly known as: MIRALAX / GLYCOLAX Take 17 g by mouth daily.   potassium chloride SA 20 MEQ tablet Commonly known as: KLOR-CON Take 40 mEq by mouth 2 (two) times daily.   zolpidem 5 MG tablet Commonly known as: AMBIEN Take 5 mg by mouth at bedtime.       Follow-up Information    10/09/2020, MD Follow up in 3 week(s).   Specialty: Neurosurgery Why: after being released from rehabilitlation Contact information: [address] [place] [phone]               Signed: [phone] 10/07/2020, 1:02 PM

## 2020-10-07 NOTE — Plan of Care (Signed)
Patient adequately ready for Rehabilitation plan.

## 2020-10-08 ENCOUNTER — Inpatient Hospital Stay (HOSPITAL_COMMUNITY): Payer: Medicare HMO

## 2020-10-08 ENCOUNTER — Encounter (HOSPITAL_COMMUNITY): Payer: Self-pay | Admitting: Physical Medicine and Rehabilitation

## 2020-10-08 ENCOUNTER — Inpatient Hospital Stay (HOSPITAL_COMMUNITY): Payer: Medicare HMO | Admitting: Physical Therapy

## 2020-10-08 DIAGNOSIS — G8222 Paraplegia, incomplete: Secondary | ICD-10-CM | POA: Diagnosis not present

## 2020-10-08 LAB — BASIC METABOLIC PANEL
Anion gap: 10 (ref 5–15)
BUN: 17 mg/dL (ref 8–23)
CO2: 29 mmol/L (ref 22–32)
Calcium: 9.1 mg/dL (ref 8.9–10.3)
Chloride: 99 mmol/L (ref 98–111)
Creatinine, Ser: 0.83 mg/dL (ref 0.44–1.00)
GFR, Estimated: >60 mL/min (ref >60)
Glucose, Bld: 109 mg/dL — ABNORMAL HIGH (ref 70–99)
Potassium: 4.1 mmol/L (ref 3.5–5.1)
Sodium: 138 mmol/L (ref 135–145)

## 2020-10-08 LAB — URINALYSIS, ROUTINE W REFLEX MICROSCOPIC
Bilirubin Urine: NEGATIVE
Glucose, UA: NEGATIVE mg/dL
Hgb urine dipstick: NEGATIVE
Ketones, ur: NEGATIVE mg/dL
Leukocytes,Ua: NEGATIVE
Nitrite: NEGATIVE
Protein, ur: NEGATIVE mg/dL
Specific Gravity, Urine: 1.012 (ref 1.005–1.030)
pH: 7 (ref 5.0–8.0)

## 2020-10-08 NOTE — Progress Notes (Signed)
Bowel program completed. Pt had large BM after suppository. Pt was placed on L side, dig stim was done, no more BM.  Pt tolerated well.

## 2020-10-08 NOTE — Progress Notes (Signed)
Inpatient Rehabilitation  Patient information reviewed and entered into eRehab system by Mohmed Farver M. Megann Easterwood, M.A., CCC/SLP, PPS Coordinator.  Information including medical coding, functional ability and quality indicators will be reviewed and updated through discharge.    

## 2020-10-08 NOTE — Evaluation (Signed)
Physical Therapy Assessment and Plan  Patient Details  Name: Mary Davila MRN: [id number] Date of Birth: [birth year]  PT Diagnosis: Difficulty walking, Impaired sensation, Muscle weakness, Paraplegia and Pain in groin down into thighs Rehab Potential: Good ELOS: 25-28 days   Today's Date: 10/08/2020 PT Individual Time: 1100-1200 PT Individual Time Calculation (min): 60 min   Hospital Problem: Principal Problem:   Acute incomplete paraplegia ([place]) Active Problems:   Lumbar disc herniation with myelopathy   Past Medical History:  Past Medical History:  Diagnosis Date  . Arthritis   . Asthma   . Constipation   . Hypertension   . Nocturia    Past Surgical History:  Past Surgical History:  Procedure Laterality Date  . ABDOMINAL HYSTERECTOMY    . BACK SURGERY    . EYE SURGERY    . JOINT REPLACEMENT Bilateral    bilateral hip replaced    Assessment & Plan Clinical Impression:  Mary Davila is a 65 year old female with history of HTN, OA/DDD who underwent L1-L2 microdisectomy at outpatient surgery on 10/03/19  but developed significant weakness BLE post op with numbness/tingling RLE. She was transferred to [facility] for work up. MRI spine showed large central disc extrusion with mass effect on spinal cord,  now with severe left L4/L5 foraminal stenosis and incidental note of massively distended urinary bladder. Patient with dense paraplegia and symptoms felt to be due to recurrent/residual migration of disc at L1 with edema. She was taken back to OR for L1 laminectomy with complete facetectomy for decompression of thecal sac on 01/14 by Dr. Kathyrn Sheriff.   Post op continues to be limited by BLE weakness with foot drop, lacks sensation BLE, dizziness with standing attempts in [place] as well as groin pain with neuropathy. Foley in place and no documented BM. Therapy has been ongoing and work on balance at [facility]. She continues to have impairment in mobility and ADLs. CIR recommended due to  functional decline. Patient transferred to CIR on 10/07/2020 .   Patient currently requires total with mobility secondary to muscle weakness and muscle paralysis, decreased cardiorespiratoy endurance, abnormal tone and unbalanced muscle activation and decreased sitting balance, decreased postural control and decreased balance strategies.  Prior to hospitalization, patient was modified independent  with mobility and lived with Alone in a [facility] home.  Home access is  Level entry.  Patient will benefit from skilled PT intervention to maximize safe functional mobility, minimize fall risk and decrease caregiver burden for planned discharge home with 24 hour supervision.  Anticipate patient will benefit from follow up [place] at discharge.  PT - End of Session Activity Tolerance: Tolerates 30+ min activity with multiple rests Endurance Deficit: Yes Endurance Deficit Description: frequent rest breaks during functional activity PT Assessment Rehab Potential (ACUTE/IP ONLY): Good PT Barriers to Discharge: Decreased caregiver support;Incontinence;Neurogenic Bowel & Bladder PT Patient demonstrates impairments in the following area(s): Balance;Endurance;Motor;Pain;Safety;Sensory PT Transfers Functional Problem(s): Bed Mobility;Bed to Chair;Car;Furniture;Floor PT Locomotion Functional Problem(s): Ambulation;Wheelchair Mobility;Stairs PT Plan PT Intensity: Minimum of 1-2 x/day ,45 to 90 minutes PT Frequency: 5 out of 7 days PT Duration Estimated Length of Stay: 25-28 days PT Treatment/Interventions: Ambulation/gait training;Balance/vestibular training;Community reintegration;Discharge planning;Disease management/prevention;DME/adaptive equipment instruction;Functional electrical stimulation;Functional mobility training;Neuromuscular re-education;Pain management;Patient/family education;Psychosocial support;Splinting/orthotics;Therapeutic Activities;Therapeutic Exercise;UE/LE Strength taining/ROM;UE/LE  Coordination activities;Wheelchair propulsion/positioning PT Transfers Anticipated Outcome(s): Supervision PT Locomotion Anticipated Outcome(s): Supervision with LRAD PT Recommendation Recommendations for Other Services: Neuropsych consult;Therapeutic Recreation consult Therapeutic Recreation Interventions: Stress management;Outing/community reintergration Follow Up Recommendations: Home health PT Patient destination: Home Equipment  Recommended: To be determined Equipment Details: TBD pending progress   PT Evaluation Precautions/Restrictions Precautions Precautions: Back;Fall Precaution Comments: Reviewed back precautions Other Brace: no brace needed per chart/orders Restrictions Weight Bearing Restrictions: No Home Living/Prior Functioning Home Living Available Help at Discharge: Family;Available PRN/intermittently Type of Home: Apartment Home Access: Level entry Home Layout: One level Additional Comments: daughter plans to assist upon d/c home initially  Lives With: Alone Prior Function Level of Independence: Independent with gait;Independent with transfers;Requires assistive device for independence Driving: No Vocation: On disability Comments: Was using a SPC and then RW for mobility since recent falls. Does not drive. Vision/Perception  Perception Perception: Within Functional Limits Praxis Praxis: Intact  Cognition Overall Cognitive Status: Within Functional Limits for tasks assessed Arousal/Alertness: Awake/alert Orientation Level: Oriented X4 Attention: Selective Selective Attention: Appears intact Memory: Appears intact Awareness: Appears intact Problem Solving: Appears intact Safety/Judgment: Appears intact Sensation Sensation Light Touch: Impaired Detail Light Touch Impaired Details: Impaired LLE;Impaired RLE (distal > proximal) Proprioception: Impaired Detail Proprioception Impaired Details: Impaired RLE;Impaired LLE (distal >  proximal) Coordination Gross Motor Movements are Fluid and Coordinated: No Fine Motor Movements are Fluid and Coordinated: Yes Coordination and Movement Description: incomplete paraplegia Motor  Motor Motor: Paraplegia;Abnormal tone;Abnormal postural alignment and control Motor - Skilled Clinical Observations: impaired 2/2 in complete paraplegia  Trunk/Postural Assessment  Cervical Assessment Cervical Assessment: Within Functional Limits Thoracic Assessment Thoracic Assessment: Exceptions to [facility] (back precautions) Lumbar Assessment Lumbar Assessment: Exceptions to [facility] (back precautions) Postural Control Postural Control: Deficits on evaluation Trunk Control: min-mod A sitting balance  Balance Balance Balance Assessed: Yes Static Sitting Balance Static Sitting - Balance Support: Right upper extremity supported;Left upper extremity supported;Feet supported Static Sitting - Level of Assistance: 4: Min assist Dynamic Sitting Balance Dynamic Sitting - Balance Support: Feet supported Dynamic Sitting - Level of Assistance: 3: Mod assist Extremity Assessment   RLE Assessment RLE Assessment: Exceptions to [facility] Passive Range of Motion (PROM) Comments: impaired 2/2 pain General Strength Comments: impaired, see below RLE Strength Right Hip Flexion: 2-/5 Right Knee Flexion: 2-/5 Right Knee Extension: 3/5 Right Ankle Dorsiflexion: 0/5 Right Ankle Plantar Flexion: 0/5 LLE Assessment LLE Assessment: Exceptions to [facility] Passive Range of Motion (PROM) Comments: impaired 2/2 pain General Strength Comments: impaired, see below LLE Strength Left Hip Flexion: 2/5 Left Knee Flexion: 1/5 Left Knee Extension: 3+/5 Left Ankle Dorsiflexion: 0/5 Left Ankle Plantar Flexion: 0/5  Care Tool Care Tool Bed Mobility Roll left and right activity   Roll left and right assist level: Maximal Assistance - Patient 25 - 49%    Sit to lying activity   Sit to lying assist level: Maximal Assistance -  Patient 25 - 49%    Lying to sitting edge of bed activity   Lying to sitting edge of bed assist level: Maximal Assistance - Patient 25 - 49%     Care Tool Transfers Sit to stand transfer   Sit to stand assist level: Dependent - Patient 0% (with stedy)    Chair/bed transfer   Chair/bed transfer assist level: Dependent - Librarian, academic transfer activity did not occur: Safety/medical concerns        Care Tool Locomotion Ambulation Ambulation activity did not occur: Safety/medical concerns        Walk 10 feet activity Walk 10 feet activity did not occur: Safety/medical concerns       Walk 50 feet with 2 turns activity Walk  50 feet with 2 turns activity did not occur: Safety/medical concerns      Walk 150 feet activity Walk 150 feet activity did not occur: Safety/medical concerns      Walk 10 feet on uneven surfaces activity Walk 10 feet on uneven surfaces activity did not occur: Safety/medical concerns      Stairs Stair activity did not occur: Safety/medical concerns        Walk up/down 1 step activity Walk up/down 1 step or curb (drop down) activity did not occur: Safety/medical concerns     Walk up/down 4 steps activity did not occuR: Safety/medical concerns  Walk up/down 4 steps activity      Walk up/down 12 steps activity Walk up/down 12 steps activity did not occur: Safety/medical concerns      Pick up small objects from floor Pick up small object from the floor (from standing position) activity did not occur: Safety/medical concerns      Wheelchair Will patient use wheelchair at discharge?: Yes Type of Wheelchair: Manual   Wheelchair assist level: Supervision/Verbal cueing Max wheelchair distance: 50  Wheel 50 feet with 2 turns activity   Assist Level: Supervision/Verbal cueing  Wheel 150 feet activity Wheelchair 150 feet activity did not occur: Safety/medical concerns Assist Level: Total Assistance - Patient  < 25%    Refer to Care Plan for Long Term Goals  SHORT TERM GOAL WEEK 1 PT Short Term Goal 1 (Week 1): Pt will perform bed mobility with mod A consistently PT Short Term Goal 2 (Week 1): Pt will perform least restrictive transfer with mod A PT Short Term Goal 3 (Week 1): Pt will perform w/c mobility x 100 ft with Supervision PT Short Term Goal 4 (Week 1): Pt will initiate gait training as safe and able  Recommendations for other services: Neuropsych and Therapeutic Recreation  Stress management and Outing/community reintegration  Skilled Therapeutic Intervention Evaluation completed (see details above and below) with education on PT POC and goals and individual treatment initiated with focus on functional bed mobility and transfer assessment as well as setting pt up with appropriate equipment to be utilized during therapy sessions. Obtained 18x18 manual w/c and Roho cushion. Semi-reclined BP 139/80. Supine to sit with max A for BLE management (pillow between legs for comfort) and trunk control via logroll, HOB slightly elevated for patient comfort and use of bedrail. Sitting balance close SBA with intermittent use of UE for support. Pt has onset of groin pain that radiates in B thighs with position change. Seated BP 138/88. Sit to stand with max A to stedy. Stedy transfer to w/c. Pt left seated in w/c in room with needs in reach at end of session.  Mobility Bed Mobility Bed Mobility: Rolling Right;Rolling Left;Supine to Sit;Sit to Supine Rolling Right: Maximal Assistance - Patient 25-49% Rolling Left: Maximal Assistance - Patient 25-49% Supine to Sit: Maximal Assistance - Patient - Patient 25-49% Sit to Supine: Maximal Assistance - Patient 25-49% Transfers Transfers: Sit to [employer] via Geophysicist/field seismologist Sit to Stand: Dependent - Administrator, Civil Service (Assistive device): Other (Comment) (via stedy) Transfer via Lift Equipment: Haematologist / Tree surgeon Stairs: No Architect: Yes Wheelchair Assistance: Chartered loss adjuster: Both lower extermities Wheelchair Parts Management: Needs assistance Distance: 50   Discharge Criteria: Patient will be discharged from PT if patient refuses treatment 3 consecutive times without medical reason, if treatment goals not met, if there is a change in medical status, if patient makes no  progress towards goals or if patient is discharged from hospital.  The above assessment, treatment plan, treatment alternatives and goals were discussed and mutually agreed upon: by patient   Excell Seltzer, PT, DPT 10/08/2020, 5:34 PM

## 2020-10-08 NOTE — Progress Notes (Signed)
Pt refused her cymbalta, Ambien, Norco. Pt stays  that she feels more pain when she takes them,

## 2020-10-08 NOTE — Progress Notes (Signed)
Physical Therapy Session Note  Patient Details  Name: Mary Davila MRN: [id number] Date of Birth: December 25, 1955  Today's Date: 10/08/2020 PT Individual Time: 1779-3903 PT Individual Time Calculation (min): 72 min   Short Term Goals: Week 1:  PT Short Term Goal 1 (Week 1): Pt will perform bed mobility with mod A consistently PT Short Term Goal 2 (Week 1): Pt will perform least restrictive transfer with mod A PT Short Term Goal 3 (Week 1): Pt will perform w/c mobility x 100 ft with Supervision PT Short Term Goal 4 (Week 1): Pt will initiate gait training as safe and able  Skilled Therapeutic Interventions/Progress Updates: Prior to session primary PT discussed with PTA pt's current status, plan, and goals. Pt presented in w/c agreeable to therapy. Pt states significant pain in groin and around hips. Rest breaks and emotional support provided throughout session as pt states unable to take additional meds until later. Pt also expressed frustration that current meds are not working and only make her drowsy but does not help with pain. Discussed with pt to communicate this info to MD as well with pt verbalizing understanding. Pt transported to ortho gym for quiet space and instructed pt in w/c propulsion. Pt was able to propel w/c with minA fading to supervision with verbal cues for ~4f with BUE. Pt then transported to rehab gym and PTA was able to obtain [employer] Performed STS with Stedy with maxA and PTA noting heavy reliance on BUE with pt leaning on crossbar of Stedy. Pt then indicated felt that she had episode of bowel incontinence. Pt then becoming emotionally labile and expressing frustration over whole situation. PTA explained that if pain is that high it would be better to conserve energy and perform additional standing in room to transfer to bed. Pt verbalized understanding. [place] and performed STS with maxA to transfer to bed. Pt attempted but was unable to fully  clear buttocks however PTA was able to lower pants while pt in hooklying position. Pt then performed rolling L/R to allow PTA to remove brief and pt returned to supine and performed peri-care with minA for BLE management (pt was not incontinent of B or B but appreciative of being able to thoroughly clean peri-area). Pt then repositioned and was able to doff top with close S and increased time and donned gown with set up. Pt left resting comfortably in bed with nsg present and current needs met.      Therapy Documentation Precautions:  Precautions Precautions: Back,Fall Required Braces or Orthoses:  (no brace needed) Restrictions Weight Bearing Restrictions: No General:   Vital Signs: Therapy Vitals Temp: 98.5 F (36.9 C) Pulse Rate: 81 Resp: 18 BP: 140/90 Patient Position (if appropriate): Sitting Oxygen Therapy SpO2: 99 % O2 Device: Room Air Pain: Pain Assessment Pain Scale: 0-10 Pain Score: 7  Pain Type: Surgical pain Pain Location: Back Pain Orientation: Lower Pain Descriptors / Indicators: Aching Pain Onset: With Activity Pain Intervention(s): Relaxation;Repositioned Mobility: Bed Mobility Bed Mobility: Rolling Right;Rolling Left;Supine to Sit;Sit to Supine Rolling Right: Maximal Assistance - Patient 25-49% Rolling Left: Maximal Assistance - Patient 25-49% Supine to Sit: Maximal Assistance - Patient - Patient 25-49% Sit to Supine: Maximal Assistance - Patient 25-49% Transfers Transfers:  (did not assess 2/2 time constraints) Locomotion :    Trunk/Postural Assessment : Cervical Assessment Cervical Assessment: Within Functional Limits Thoracic Assessment Thoracic Assessment: Exceptions to [facility](back precautions) Lumbar Assessment Lumbar Assessment: Exceptions to [facility], posterior  pelvic tilt compensation) Postural Control Postural Control: Deficits on evaluation Trunk Control: min-mod A sitting balance  Balance: Balance Balance Assessed:  Yes Static Sitting Balance Static Sitting - Balance Support: Feet supported Static Sitting - Level of Assistance: 4: Min assist Dynamic Sitting Balance Dynamic Sitting - Balance Support: Feet supported Dynamic Sitting - Level of Assistance: 3: Mod assist Exercises:   Other Treatments:      Therapy/Group: Individual Therapy  Zamorah Ailes 10/08/2020, 4:07 PM

## 2020-10-08 NOTE — Evaluation (Signed)
Occupational Therapy Assessment and Plan  Patient Details  Name: Mary Davila MRN: [id number] Date of Birth: June 30, 1956  OT Diagnosis: acute pain, muscle weakness (generalized) and paraplegia at level L1/2 Rehab Potential: Rehab Potential (ACUTE ONLY): Good ELOS: 3-4 weeks   Today's Date: 10/08/2020 OT Individual Time: 0900-1010 OT Individual Time Calculation (min): 70 min     Hospital Problem: Principal Problem:   Acute incomplete paraplegia ([place]) Active Problems:   Lumbar disc herniation with myelopathy   Past Medical History:  Past Medical History:  Diagnosis Date  . Arthritis   . Asthma   . Constipation   . Hypertension   . Nocturia    Past Surgical History:  Past Surgical History:  Procedure Laterality Date  . ABDOMINAL HYSTERECTOMY    . BACK SURGERY    . EYE SURGERY    . JOINT REPLACEMENT Bilateral    bilateral hip replaced    Assessment & Plan Clinical Impression: Mary Davila is a 65 year old female with history of HTN, OA/DDD who underwent L1-L2 microdisectomy at outpatient surgery on 10/03/19  but developed significant weakness BLE post op with numbness/tingling RLE. She was transferred to [facility] for work up. MRI spine showed large central disc extrusion with mass effect on spinal cord,  now with severe left L4/L5 foraminal stenosis and incidental note of massively distended urinary bladder. Patient with dense paraplegia and symptoms felt to be due to recurrent/residual migration of disc at L1 with edema. She was taken back to OR for L1 laminectomy with complete facetectomy for decompression of thecal sac on 01/14 by Dr. Kathyrn Sheriff.   Post op continues to be limited by BLE weakness with foot drop, lacks sensation BLE, dizziness with standing attempts in [place] as well as groin pain with neuropathy. Foley in place and no documented BM. Therapy has been ongoing and work on balance at [place]. She continues to have impairment in mobility and ADLs. CIR recommended due to  functional decline. Patient transferred to CIR on 10/07/2020 .    Patient currently requires max with basic self-care skills secondary to muscle weakness and muscle paralysis, decreased cardiorespiratoy endurance, unbalanced muscle activation and decreased sitting balance, decreased standing balance, decreased postural control and decreased balance strategies.  Prior to hospitalization, patient could complete ADLs with modified independent .  Patient will benefit from skilled intervention to decrease level of assist with basic self-care skills and increase level of independence with iADL prior to discharge home with care partner.  Anticipate patient will require intermittent supervision and follow up home health.  OT - End of Session Activity Tolerance: Tolerates 10 - 20 min activity with multiple rests Endurance Deficit: Yes Endurance Deficit Description: generalized weakness OT Assessment Rehab Potential (ACUTE ONLY): Good OT Barriers to Discharge: Decreased caregiver support OT Barriers to Discharge Comments: unclear how long daughter will stay OT Patient demonstrates impairments in the following area(s): Balance;Motor;Sensory;Pain;Safety;Endurance OT Basic ADL's Functional Problem(s): Bathing;Dressing;Toileting OT Advanced ADL's Functional Problem(s): Simple Meal Preparation;Light Housekeeping OT Transfers Functional Problem(s): Toilet;Tub/Shower OT Additional Impairment(s): None OT Plan OT Intensity: Minimum of 1-2 x/day, 45 to 90 minutes OT Frequency: 5 out of 7 days OT Duration/Estimated Length of Stay: 3-4 weeks OT Treatment/Interventions: Balance/vestibular training;Community reintegration;Discharge planning;DME/adaptive equipment instruction;Functional mobility training;Pain management;Psychosocial support;Therapeutic Activities;Wheelchair propulsion/positioning;UE/LE Coordination activities;Therapeutic Exercise;Splinting/orthotics;Self Care/advanced ADL retraining;Patient/family  education;Neuromuscular re-education;Functional electrical stimulation;UE/LE Strength taining/ROM OT Self Feeding Anticipated Outcome(s): no goal set OT Basic Self-Care Anticipated Outcome(s): supervision OT Toileting Anticipated Outcome(s): supervision OT Bathroom Transfers Anticipated Outcome(s): supervision OT Recommendation Patient destination:  Home Follow Up Recommendations: Home health OT Equipment Recommended: Tub/shower bench   OT Evaluation Precautions/Restrictions  Precautions Precautions: Back;Fall Required Braces or Orthoses:  (no brace needed) Restrictions Weight Bearing Restrictions: No General Chart Reviewed: Yes Family/Caregiver Present: No Vital Signs  Pain Pain Assessment Pain Scale: 0-10 Pain Score: 7  Pain Type: Surgical pain Pain Location: Back Pain Orientation: Lower Pain Descriptors / Indicators: Aching Pain Onset: With Activity Pain Intervention(s): Relaxation;Repositioned Home Living/Prior Functioning Home Living Family/patient expects to be discharged to:: Private residence Living Arrangements: Alone Available Help at Discharge: Family,Available PRN/intermittently (daughter will help) Type of Home: Apartment Home Access: Level entry Home Layout: One level Bathroom Shower/Tub: Chiropodist: Standard Bathroom Accessibility: Yes  Lives With: Alone IADL History Homemaking Responsibilities: Yes Meal Prep Responsibility: Primary Laundry Responsibility: Primary Cleaning Responsibility: Primary Bill Paying/Finance Responsibility: Primary Shopping Responsibility: Primary Prior Function Level of Independence: Independent with basic ADLs,Independent with homemaking with ambulation,Independent with gait Driving: No Comments: Was using a SPC and then RW for mobility since recent falls. Does not drive. Vision Baseline Vision/History: Wears glasses Wears Glasses: At all times Patient Visual Report: No change from  baseline Vision Assessment?: No apparent visual deficits Perception  Perception: Within Functional Limits Praxis Praxis: Intact Cognition Overall Cognitive Status: Within Functional Limits for tasks assessed Arousal/Alertness: Awake/alert Orientation Level: Person;Place;Situation Person: Oriented Place: Oriented Situation: Oriented Year: 2022 Month: January Day of Week: Incorrect (Tuesday) Memory: Appears intact Immediate Memory Recall: Sock;Bed;Blue Memory Recall Sock: Without Cue Memory Recall Blue: Without Cue Memory Recall Bed: Without Cue Attention: Selective Selective Attention: Appears intact Awareness: Appears intact Safety/Judgment: Appears intact Sensation Sensation Light Touch: Impaired Detail Central sensation comments: Able to distinguish light touch in legs, absent bottom of feet Light Touch Impaired Details: Impaired LLE;Impaired RLE Proprioception: Impaired by gross assessment Coordination Gross Motor Movements are Fluid and Coordinated: No Fine Motor Movements are Fluid and Coordinated: Yes Coordination and Movement Description: incomplete paraplegia Motor  Motor Motor: Paraplegia  Trunk/Postural Assessment  Cervical Assessment Cervical Assessment: Within Functional Limits Thoracic Assessment Thoracic Assessment: Exceptions to [facility] (back precautions) Lumbar Assessment Lumbar Assessment: Exceptions to [facility] (back precautions, posterior pelvic tilt compensation) Postural Control Postural Control: Deficits on evaluation Trunk Control: min-mod A sitting balance  Balance Balance Balance Assessed: Yes Static Sitting Balance Static Sitting - Balance Support: Feet supported Static Sitting - Level of Assistance: 4: Min assist Dynamic Sitting Balance Dynamic Sitting - Balance Support: Feet supported Dynamic Sitting - Level of Assistance: 3: Mod assist Extremity/Trunk Assessment RUE Assessment RUE Assessment: Within Functional Limits LUE Assessment LUE  Assessment: Within Functional Limits  Care Tool Care Tool Self Care Eating   Eating Assist Level: Set up assist    Oral Care    Oral Care Assist Level: Set up assist    Bathing   Body parts bathed by patient: Right arm;Left arm;Chest;Abdomen;Front perineal area;Face Body parts bathed by helper: Buttocks;Right upper leg;Left upper leg;Right lower leg;Left lower leg   Assist Level: Moderate Assistance - Patient 50 - 74% (bed level)    Upper Body Dressing(including orthotics)   What is the patient wearing?: Pull over shirt   Assist Level: Minimal Assistance - Patient > 75%    Lower Body Dressing (excluding footwear)   What is the patient wearing?: Incontinence brief Assist for lower body dressing: Total Assistance - Patient < 25%    Putting on/Taking off footwear   What is the patient wearing?: Non-skid slipper socks Assist for footwear: Dependent - Patient 0%  Care Tool Toileting Toileting activity Toileting Activity did not occur (Clothing management and hygiene only): N/A (no void or bm)       Care Tool Bed Mobility Roll left and right activity   Roll left and right assist level: Maximal Assistance - Patient 25 - 49%    Sit to lying activity   Sit to lying assist level: Maximal Assistance - Patient 25 - 49%    Lying to sitting edge of bed activity   Lying to sitting edge of bed assist level: Maximal Assistance - Patient 25 - 49%     Care Tool Transfers Sit to stand transfer   Sit to stand assist level: Dependent - Patient 0%    Chair/bed transfer Chair/bed transfer activity did not occur: Safety/medical concerns       Toilet transfer Toilet transfer activity did not occur: Safety/medical concerns       Care Tool Cognition Expression of Ideas and Wants Expression of Ideas and Wants: Without difficulty (complex and basic) - expresses complex messages without difficulty and with speech that is clear and easy to understand   Understanding Verbal and  Non-Verbal Content Understanding Verbal and Non-Verbal Content: Understands (complex and basic) - clear comprehension without cues or repetitions   Memory/Recall Ability *first 3 days only Memory/Recall Ability *first 3 days only: Current season;Location of own room;That he or she is in a hospital/hospital unit;Staff names and faces    Refer to Care Plan for Long Term Goals  SHORT TERM GOAL WEEK 1 OT Short Term Goal 1 (Week 1): Pt will maintain sitting balance EOB during ADLs with min A and no use of hand rails OT Short Term Goal 2 (Week 1): Pt will don LB clothing at bed level with mod A OT Short Term Goal 3 (Week 1): Pt will roll R and L with use of bed features with mod A during LB dressing  Recommendations for other services: None    Skilled Therapeutic Intervention ADL ADL Eating: Set up Grooming: Setup Where Assessed-Grooming: Bed level Upper Body Bathing: Minimal assistance;Minimal cueing Where Assessed-Upper Body Bathing: Edge of bed Lower Body Bathing: Maximal assistance Where Assessed-Lower Body Bathing: Bed level Upper Body Dressing: Minimal assistance Where Assessed-Upper Body Dressing: Edge of bed Lower Body Dressing: Maximal assistance Where Assessed-Lower Body Dressing: Bed level Toileting: Unable to assess Toilet Transfer: Unable to assess Toilet Transfer Method: Unable to assess Tub/Shower Transfer: Unable to assess Mobility  Bed Mobility Bed Mobility: Rolling Right;Rolling Left;Supine to Sit;Sit to Supine Rolling Right: Maximal Assistance - Patient 25-49% Rolling Left: Maximal Assistance - Patient 25-49% Supine to Sit: Maximal Assistance - Patient - Patient 25-49% Sit to Supine: Maximal Assistance - Patient 25-49%  Skilled OT evaluation completed and detailed above. Pt with incomplete paraplegia at level L1/2 with expected deficits in trunk control, LE weakness/parestheia. Pt will required 3-4 week CIR stay to maximize independence and d/c home. Pt is very  motivated and excellent CIR candidate. She is also limited by anterior groin/hip pain.   Discharge Criteria: Patient will be discharged from OT if patient refuses treatment 3 consecutive times without medical reason, if treatment goals not met, if there is a change in medical status, if patient makes no progress towards goals or if patient is discharged from hospital.  The above assessment, treatment plan, treatment alternatives and goals were discussed and mutually agreed upon: by patient  Curtis Sites 10/08/2020, 12:54 PM

## 2020-10-08 NOTE — Progress Notes (Signed)
Bilateral lower extremity venous study completed.      Please see CV Proc for preliminary results.   Taquisha Phung, RVT  

## 2020-10-08 NOTE — Progress Notes (Signed)
[facility] PHYSICAL MEDICINE & REHABILITATION PROGRESS NOTE   Subjective/Complaints:   Pt reports cannot tolerate PRAFOs- so hot.  Also had a "fever" this AM- 100.5 degrees this AM-  Also reports they took Foley out- but hasn't been able to start Flomax- usrgery was 1/13-  Did have small BM with bowel program last night.    ROS:  Pt denies SOB, abd pain, CP, N/V/C/D, and vision changes  Objective:   DG Abd 1 View  Result Date: 10/07/2020 CLINICAL DATA:  Constipation EXAM: ABDOMEN - 1 VIEW COMPARISON:  None. FINDINGS: Scattered large and small bowel gas is noted. Scattered fecal material is noted throughout the colon without obstructive change. No free air is noted. Postsurgical changes in the lumbar spine and hip joints are seen. IMPRESSION: Mild retained fecal material without obstructive change. Electronically Signed   By: Alcide Clever M.D.   On: 10/07/2020 20:20   Recent Labs    10/07/20 1620  WBC 7.5  HGB 11.7*  HCT 34.8*  PLT 346   Recent Labs    10/07/20 1620 10/08/20 0623  NA 139 138  K 4.3 4.1  CL 100 99  CO2 28 29  GLUCOSE 127* 109*  BUN 19 17  CREATININE 1.07* 0.83  CALCIUM 9.0 9.1    Intake/Output Summary (Last 24 hours) at 10/08/2020 1046 Last data filed at 10/08/2020 0836 Gross per 24 hour  Intake 120 ml  Output 1925 ml  Net -1805 ml        Physical Exam: Vital Signs Blood pressure (!) 141/81, pulse 87, temperature 98.2 F (36.8 C), resp. rate 16, height 5' 5.5" (1.664 m), weight 72.9 kg, SpO2 100 %.  Constitutional:  awake, sitting up in bed; depressed affect, NAD- no fever right now HENT: conjugate gaze Cardiovascular: RRR Pulmonary: CTA B/L- no W/R/R- good air movement- no sounds c/w infection Abdominal: Soft, NT, ND, (+)BS  Genitourinary: foley out Musculoskeletal:     Comments: UE's 5/5 in deltoid, biceps, triceps, WE grip and finger abd LE's RLE- HF 2-/5, KE 3+/5, DF and PF 0/5 LLE- HF 1/5, KE 3+/5, DF and PF 0/5  Skin: back  dressing in place - honeycomb- no drainage on dressing C/D/I Neurological: slowed processing- Ox3; depression vs slowed     Comments: Sensation decreased at T12  To S5- dramatic reduction in sensation in T11/12 but when eyes closed, wasn't sure where I was touching her from L1-S1 nor S2-S5 B/L No hoffman's B/L No increased tone at this time B/L foot drop as above - no change Psychiatric: depressed affect  Assessment/Plan: 1. Functional deficits which require 3+ hours per day of interdisciplinary therapy in a comprehensive inpatient rehab setting.  Physiatrist is providing close team supervision and 24 hour management of active medical problems listed below.  Physiatrist and rehab team continue to assess barriers to discharge/monitor patient progress toward functional and medical goals  Care Tool:  Bathing    Body parts bathed by patient: Right arm,Left arm,Chest,Abdomen,Front perineal area,Face   Body parts bathed by helper: Buttocks,Right upper leg,Left upper leg,Right lower leg,Left lower leg     Bathing assist Assist Level: Moderate Assistance - Patient 50 - 74% (bed level)     Upper Body Dressing/Undressing Upper body dressing Upper body dressing/undressing activity did not occur (including orthotics): Safety/medical concerns What is the patient wearing?: Pull over shirt    Upper body assist Assist Level: Minimal Assistance - Patient > 75%    Lower Body Dressing/Undressing Lower body dressing  What is the patient wearing?: Incontinence brief     Lower body assist Assist for lower body dressing: Total Assistance - Patient < 25%     Toileting Toileting Toileting Activity did not occur Press photographer and hygiene only): N/A (no void or bm)  Toileting assist       Transfers Chair/bed transfer  Transfers assist  Chair/bed transfer activity did not occur: Safety/medical concerns        Locomotion Ambulation   Ambulation assist               Walk 10 feet activity   Assist           Walk 50 feet activity   Assist           Walk 150 feet activity   Assist           Walk 10 feet on uneven surface  activity   Assist           Wheelchair     Assist               Wheelchair 50 feet with 2 turns activity    Assist            Wheelchair 150 feet activity     Assist          Blood pressure (!) 141/81, pulse 87, temperature 98.2 F (36.8 C), resp. rate 16, height 5' 5.5" (1.664 m), weight 72.9 kg, SpO2 100 %.  Medical Problem List and Plan: 1.  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  Antithrombotics: -DVT/anticoagulation:  Pharmaceutical: Heparin             -antiplatelet therapy: N/a 3. Pain Management: Currently using hydrocodone with flexeril prn- changed from robaxin, since wasn't working             ---Continue Neurontin 300 mg tid for neuropathy but added Cymbalta 20 mg QHS for nerve pain.   1/19- pt doesn't want an antidepressant med- explained it's for nerve pain-  4. Mood: LCSW to follow for evaluation and support.              -antipsychotic agents:  N/A 5. Neuropsych: This patient is capable of making decisions on her own behalf. 6. Skin/Wound Care: Monitor wound for healing.  7. Fluids/Electrolytes/Nutrition: Monitor I/O. Check lytes in am.  8.  HTN: Monitor BP tid--having HYPOTENSION per therapy notes and pt- will stop Norvasc 5 mg daily and see if does ok- might need midodrine for hypotension   1/19- BP very slightly high- due to off Norvasc- but hopefully will help orthostatic hypotension- will monitor 9. Hypokalemia: Preadmission labs showed K+2.9. ON Kdur 40 meq bid--check follow up labs today. Fluids d/c yesterday.  10. Mild anemia: Recheck CBC for post op labs--likely has ABLA.   11.   COPD: Respiratory status stable on Dulera bid. Albuterol prn SOB.  12. Neurogenic bladder:  Will d/c foley  In a couple of days- right now cannot tolerate Flomax due to low BP issues- will give it a few days, since having to start to deal with bowel program today- will remove Friday vs Monday-   1/19- foley removed- will see if can void- unlikely- if does, great- if  doesn't, will put back pending trying to start Flomax once got BP better controlled 13. Neurogenic bowel: Has had constipation for past few months (goes once a week). She had multiple BMs (hard balls -->liquid) yesterday after mag citrate. Will order KUB to check stool burden.   Will continue Senna S with miralax daily. Schedule suppository after supper.     - will make sure we start bowel program this evening- discussed at length with pt about bowel program.  1/19-  Pt had medium BM with bowel program last night- con't daily.  14. At level SCI pain- starting Cymbalta 20 mg QHS and will titrate up. Already on gabapentin but not working so far. 15. Low grade fever this AM  1/19- WBC normal- no left shift seen- get U/A and Cx- and monitor- if U/A (+), will start PO ABX.     LOS: 1 days A FACE TO FACE EVALUATION WAS PERFORMED  Chandler Stofer 10/08/2020, 10:46 AM

## 2020-10-09 ENCOUNTER — Inpatient Hospital Stay (HOSPITAL_COMMUNITY): Payer: Medicare HMO

## 2020-10-09 ENCOUNTER — Inpatient Hospital Stay (HOSPITAL_COMMUNITY): Payer: Medicare HMO | Admitting: Physical Therapy

## 2020-10-09 ENCOUNTER — Inpatient Hospital Stay (HOSPITAL_COMMUNITY): Payer: Medicare HMO | Admitting: Occupational Therapy

## 2020-10-09 MED ORDER — AMLODIPINE BESYLATE 2.5 MG PO TABS
2.5000 mg | ORAL_TABLET | Freq: Every day | ORAL | Status: DC
  Administered 2020-10-09 – 2020-10-31 (×23): 2.5 mg via ORAL
  Filled 2020-10-09 (×23): qty 1

## 2020-10-09 MED ORDER — LEVETIRACETAM 250 MG PO TABS
250.0000 mg | ORAL_TABLET | Freq: Two times a day (BID) | ORAL | Status: DC
  Administered 2020-10-09 – 2020-10-12 (×7): 250 mg via ORAL
  Filled 2020-10-09 (×7): qty 1

## 2020-10-09 MED ORDER — ENOXAPARIN SODIUM 40 MG/0.4ML ~~LOC~~ SOLN
40.0000 mg | SUBCUTANEOUS | Status: DC
  Administered 2020-10-09 – 2020-11-07 (×30): 40 mg via SUBCUTANEOUS
  Filled 2020-10-09 (×30): qty 0.4

## 2020-10-09 NOTE — Progress Notes (Signed)
Physical Therapy Session Note  Patient Details  Name: Mary Davila MRN: [id number] Date of Birth: 1956-05-11  Today's Date: 10/09/2020 PT Individual Time: 0800-0915 PT Individual Time Calculation (min): 75 min   Short Term Goals: Week 1:  PT Short Term Goal 1 (Week 1): Pt will perform bed mobility with mod A consistently PT Short Term Goal 2 (Week 1): Pt will perform least restrictive transfer with mod A PT Short Term Goal 3 (Week 1): Pt will perform w/c mobility x 100 ft with Supervision PT Short Term Goal 4 (Week 1): Pt will initiate gait training as safe and able  Skilled Therapeutic Interventions/Progress Updates:   Patient received supine in bed, agreeable to PT. She reports 8/10 pain in B groin, R>L, premedicated. PT providing rest breaks, repositioning and distractions to assist with pain management. She required MaxA to don pants at bedlevel with CGA to roll maintaining spinal precautions. She was able to come sit edge of bed with ModA. MinA to don shirt sitting edge of bed with appropriate sitting balance noted. Patient required x2 assist to use Stedy to transfer to wc. She was able to propel herself in wc to therapy gym with MinA for efficiency. PT educating patient on use of slideboard for transfers. TotalA to place slideboard with MinA to complete transfer. Appropriate weight shifting to increase efficiency and ease of transfer. Patient able to maintain static sitting balance edge of mat with standby assist and dynamic reaching tasks within and outside BOS with stand by assist. She required intermittent rest breaks due to increased spasms in RLQ- RN notified and aware. Patient completing LAQ with 1.5# ankle weights 2x12. She required MinA to transfer back to wc via slideboard. Patient remaining up in wc, seatbelt alarm on, call light within reach.    Therapy Documentation Precautions:  Precautions Precautions: Back,Fall Precaution Comments: Reviewed back precautions Required Braces  or Orthoses:  (no brace needed) Other Brace: no brace needed per chart/orders Restrictions Weight Bearing Restrictions: No    Therapy/Group: Individual Therapy  Elizebeth Koller, PT, DPT, CBIS  10/09/2020, 7:32 AM

## 2020-10-09 NOTE — Progress Notes (Signed)
Occupational Therapy Session Note  Patient Details  Name: Mary Davila MRN: [id number] Date of Birth: 03-14-1956  Today's Date: 10/09/2020 OT Individual Time: 1100-1200 OT Individual Time Calculation (min): 60 min    Short Term Goals: Week 1:  OT Short Term Goal 1 (Week 1): Pt will maintain sitting balance EOB during ADLs with min A and no use of hand rails OT Short Term Goal 2 (Week 1): Pt will don LB clothing at bed level with mod A OT Short Term Goal 3 (Week 1): Pt will roll R and L with use of bed features with mod A during LB dressing  Skilled Therapeutic Interventions/Progress Updates:    Patient in bed, alert and states that she is ready for therapy session.  She reports recent med pass and not feeling great but she would like to get OOB and sit in w/c.  BP 161/87 - nursing alerted to how she is feeling and current BP.   She notes pain in groin and down thighs with movement - this is relieved with repositioning at times.  Rolling right min A, side lying to sitting edge of bed with mod A and moderate pain which subsides as she sits.   SB transfer bed to w/c with mod A - cues for technique.  She completed UB bathing and dressing seated at sink with set up/CS.  Reviewed repositioning and weight shift options at w/c level, reviewed back precautions and safety with mobility - overall good understanding demonstrated but she presents as anxious and states that she is depressed over current condition.  Completed light arm exercises.  She requests to stay in w/c at close of session - seat belt alarm set and call bell in hand.    Therapy Documentation Precautions:  Precautions Precautions: Back,Fall Precaution Comments: Reviewed back precautions Required Braces or Orthoses:  (no brace needed) Other Brace: no brace needed per chart/orders Restrictions Weight Bearing Restrictions: No  Therapy/Group: Individual Therapy  Barrie Lyme 10/09/2020, 7:37 AM

## 2020-10-09 NOTE — Progress Notes (Signed)
[facility]YSICAL MEDICINE & [facility]HABILITATION PROGRESS NOTE   Subjective/Complaints:  Pt reports "strong" pain meds make her sick and make her hurt "worse" than tylenol/excedrin- she took excedrin at home- asked me to stop Cymbalta and Vicodin. Gabapentin makes her feet swell on higher doses.   Also still having dizziness with getting up.   Still having nerve pain as well and "bad pain" according to pt and sister.      ROS:  Pt denies SOB, abd pain, CP, N/V/C/D, and vision changes  Objective:   DG Abd 1 View  Result Date: 10/07/2020 CLINICAL DATA:  Constipation EXAM: ABDOMEN - 1 VIEW COMPARISON:  None. FINDINGS: Scattered large and small bowel gas is noted. Scattered fecal material is noted throughout the colon without obstructive change. No free air is noted. Postsurgical changes in the lumbar spine and hip joints are seen. IMPRESSION: Mild retained fecal material without obstructive change. Electronically Signed   By: Alcide Clever M.D.   On: 10/07/2020 20:20   VAS [place] LOWER EXTREMITY VENOUS (DVT)  Result Date: 10/08/2020  Lower Venous DVT Study Indications: Immobility.  Risk Factors: Surgery Back Surgery 10/04/19. Anticoagulation: Heparin. Comparison Study: No previous exam Performing Technologist: Clint Guy RVT  Examination Guidelines: A complete evaluation includes B-mode imaging, spectral Doppler, color Doppler, and power Doppler as needed of all accessible portions of each vessel. Bilateral testing is considered an integral part of a complete examination. Limited examinations for reoccurring indications may be performed as noted. The reflux portion of the exam is performed with the patient in reverse Trendelenburg.  +---------+---------------+---------+-----------+----------+--------------+ RIGHT    CompressibilityPhasicitySpontaneityPropertiesThrombus Aging +---------+---------------+---------+-----------+----------+--------------+ CFV      Full           Yes      Yes                                  +---------+---------------+---------+-----------+----------+--------------+ SFJ      Full                                                        +---------+---------------+---------+-----------+----------+--------------+ FV Prox  Full                                                        +---------+---------------+---------+-----------+----------+--------------+ FV Mid   Full                                                        +---------+---------------+---------+-----------+----------+--------------+ FV DistalFull                                                        +---------+---------------+---------+-----------+----------+--------------+ PFV      Full                                                        +---------+---------------+---------+-----------+----------+--------------+  POP      Full           Yes      Yes                                 +---------+---------------+---------+-----------+----------+--------------+ PTV      Full                                                        +---------+---------------+---------+-----------+----------+--------------+ PERO     Full                                                        +---------+---------------+---------+-----------+----------+--------------+   +---------+---------------+---------+-----------+----------+--------------+ LEFT     CompressibilityPhasicitySpontaneityPropertiesThrombus Aging +---------+---------------+---------+-----------+----------+--------------+ CFV      Full           Yes      Yes                                 +---------+---------------+---------+-----------+----------+--------------+ SFJ      Full                                                        +---------+---------------+---------+-----------+----------+--------------+ FV Prox  Full                                                         +---------+---------------+---------+-----------+----------+--------------+ FV Mid   Full                                                        +---------+---------------+---------+-----------+----------+--------------+ FV DistalFull                                                        +---------+---------------+---------+-----------+----------+--------------+ PFV      Full                                                        +---------+---------------+---------+-----------+----------+--------------+ POP      Full           Yes      Yes                                 +---------+---------------+---------+-----------+----------+--------------+  PTV      Full                                                        +---------+---------------+---------+-----------+----------+--------------+ PERO     Full                                                        +---------+---------------+---------+-----------+----------+--------------+     Summary: BILATERAL: - No evidence of deep vein thrombosis seen in the lower extremities, bilaterally. - RIGHT: - A cystic structure is found in the popliteal fossa.  LEFT: - A cystic structure is found in the popliteal fossa.  *See table(s) above for measurements and observations. Electronically signed by Sherald Hess MD on 10/08/2020 at 6:10:01 PM.    Final    Recent Labs    10/07/20 1620  WBC 7.5  HGB 11.7*  HCT 34.8*  PLT 346   Recent Labs    10/07/20 1620 10/08/20 0623  NA 139 138  K 4.3 4.1  CL 100 99  CO2 28 29  GLUCOSE 127* 109*  BUN 19 17  CREATININE 1.07* 0.83  CALCIUM 9.0 9.1    Intake/Output Summary (Last 24 hours) at 10/09/2020 1012 Last data filed at 10/09/2020 0819 Gross per 24 hour  Intake 680 ml  Output 2825 ml  Net -2145 ml        Physical Exam: Vital Signs Blood pressure (!) 156/91, pulse 86, temperature 99.4 F (37.4 C), temperature source Oral, resp. rate 20, height 5' 5.5" (1.664 m),  weight 72.9 kg, SpO2 98 %.  Constitutional:  awake, leaning to side in bed- appropriate, but near tears about pain meds, no acute distress HENT: conjugate gaze Cardiovascular: RRR Pulmonary: CTA B/L- no W/R/R- good air movement Abdominal: Soft, NT, ND, (+)BS   Genitourinary: foley out Musculoskeletal:     Comments: UE's 5/5 in deltoid, biceps, triceps, WE grip and finger abd LE's RLE- HF 2-/5, KE 3+/5, DF and PF 0/5 LLE- HF 1/5, KE 3+/5, DF and PF 0/5  Skin: back dressing in place - honeycomb- no drainage on dressing C/D/I- not assessed today Neurological: slowed processing- (per family, some is chronic) Ox3; depression vs slowed     Comments: Sensation decreased at T12  To S5- dramatic reduction in sensation in T11/12 but when eyes closed, wasn't sure where I was touching her from L1-S1 nor S2-S5 B/L No hoffman's B/L No increased tone at this time B/L foot drop as above - no change Psychiatric: depressed affect still, near tears  Assessment/Plan: 1. Functional deficits which require 3+ hours per day of interdisciplinary therapy in a comprehensive inpatient rehab setting.  Physiatrist is providing close team supervision and 24 hour management of active medical problems listed below.  Physiatrist and rehab team continue to assess barriers to discharge/monitor patient progress toward functional and medical goals  Care Tool:  Bathing    Body parts bathed by patient: Right arm,Left arm,Chest,Abdomen,Front perineal area,Face   Body parts bathed by helper: Buttocks,Right upper leg,Left upper leg,Right lower leg,Left lower leg     Bathing assist Assist Level: Moderate Assistance - Patient 50 - 74% (bed level)  Upper Body Dressing/Undressing Upper body dressing Upper body dressing/undressing activity did not occur (including orthotics): Safety/medical concerns What is the patient wearing?: Pull over shirt    Upper body assist Assist Level: Minimal Assistance - Patient > 75%     Lower Body Dressing/Undressing Lower body dressing      What is the patient wearing?: Incontinence brief     Lower body assist Assist for lower body dressing: Total Assistance - Patient < 25%     Toileting Toileting Toileting Activity did not occur Press photographer(Clothing management and hygiene only): N/A (no void or bm)  Toileting assist Assist for toileting: Dependent - Patient 0%     Transfers Chair/bed transfer  Transfers assist  Chair/bed transfer activity did not occur: Safety/medical concerns  Chair/bed transfer assist level: Dependent - mechanical lift     Locomotion Ambulation   Ambulation assist   Ambulation activity did not occur: Safety/medical concerns          Walk 10 feet activity   Assist  Walk 10 feet activity did not occur: Safety/medical concerns        Walk 50 feet activity   Assist Walk 50 feet with 2 turns activity did not occur: Safety/medical concerns         Walk 150 feet activity   Assist Walk 150 feet activity did not occur: Safety/medical concerns         Walk 10 feet on uneven surface  activity   Assist Walk 10 feet on uneven surfaces activity did not occur: Safety/medical concerns         Wheelchair     Assist Will patient use wheelchair at discharge?: Yes Type of Wheelchair: Manual    Wheelchair assist level: Supervision/Verbal cueing Max wheelchair distance: 50    Wheelchair 50 feet with 2 turns activity    Assist        Assist Level: Supervision/Verbal cueing   Wheelchair 150 feet activity     Assist  Wheelchair 150 feet activity did not occur: Safety/medical concerns   Assist Level: Total Assistance - Patient < 25%   Blood pressure (!) 156/91, pulse 86, temperature 99.4 F (37.4 C), temperature source Oral, resp. rate 20, height 5' 5.5" (1.664 m), weight 72.9 kg, SpO2 98 %.  Medical Problem List and Plan: 1.  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  Antithrombotics: -DVT/anticoagulation:  Pharmaceutical: Heparin 1/20- changed to Lovenox             -antiplatelet therapy: N/a 3. Pain Management: Currently using hydrocodone with flexeril prn- changed from robaxin, since wasn't working             ---Continue Neurontin 300 mg tid for neuropathy but added Cymbalta 20 mg QHS for nerve pain.   1/19- pt doesn't want an antidepressant med- explained it's for nerve pain-   1/20- pt reports pain meds make her hurt "worse"- refusing vicodin and "allergic to tramadol" and refused cymbalta- says gabapentin causes swelling- will con't tylenol prn- cannot do excedrin due to bleeding risk 4. Mood: LCSW to follow for evaluation and support.              -antipsychotic agents:  N/A 5. Neuropsych: This patient is capable of making decisions on  her own behalf. 6. Skin/Wound Care: Monitor wound for healing.  7. Fluids/Electrolytes/Nutrition: Monitor I/O. Check lytes in am.  8.  HTN: Monitor BP tid--having HYPOTENSION per therapy notes and pt- will stop Norvasc 5 mg daily and see if does ok- might need midodrine for hypotension   1/19- BP very slightly high- due to off Norvasc- but hopefully will help orthostatic hypotension- will monitor  1/20- will restart Norvasc 2.5 mg daily since BP 156/91 this AM 9. Hypokalemia: Preadmission labs showed K+2.9. ON Kdur 40 meq bid--check follow up labs today. Fluids d/c yesterday.  10. Mild anemia: Recheck CBC for post op labs--likely has ABLA.   11.  COPD: Respiratory status stable on Dulera bid. Albuterol prn SOB.  12. Neurogenic bladder:  Will d/c foley  In a couple of days- right now cannot tolerate Flomax due to low BP issues- will give it a few days, since having to start to deal with bowel program today- will remove Friday vs Monday-   1/19- foley removed- will see if can void-  unlikely- if does, great- if doesn't, will put back pending trying to start Flomax once got BP better controlled 13. Neurogenic bowel: Has had constipation for past few months (goes once a week). She had multiple BMs (hard balls -->liquid) yesterday after mag citrate. Will order KUB to check stool burden.   Will continue Senna S with miralax daily. Schedule suppository after supper.     - will make sure we start bowel program this evening- discussed at length with pt about bowel program.  1/19-  Pt had medium BM with bowel program last night- con't daily.   1/20- large BM with bowel program as well as small BM this AM 14. At level SCI pain- starting Cymbalta 20 mg QHS and will titrate up. Already on gabapentin but not working so far.  1/20- changed to keppra 250 mg BID for nerve pain 15. Low grade fever this AM  1/19- WBC normal- no left shift seen- get U/A and Cx- and monitor- if U/A (+), will start PO ABX.   1/20- U/A (-)  I spent a total of 40 minutes on total care- >50% coordination of care- talking to sister on phone- encouraged her to call [facility] since they don't understand why she had to have 2 surgeries and why having sensory and bowel and bladder deficits- I went over the SCI, but they don't understand WHY.     LOS: 2 days A FACE TO FACE EVALUATION WAS PERFORMED  Kathalene Sporer 10/09/2020, 10:12 AM

## 2020-10-09 NOTE — Care Management (Signed)
[facility]patient [facility] Individual Statement of Services  Patient Name:  Mary Davila  Date:  10/09/2020  Welcome to the Inpatient Rehabilitation Center.  Our goal is to provide you with an individualized program based on your diagnosis and situation, designed to meet your specific needs.  With this comprehensive rehabilitation program, you will be expected to participate in at least 3 hours of rehabilitation therapies Monday-Friday, with modified therapy programming on the weekends.  Your rehabilitation program will include the following services:  Physical Therapy (PT), Occupational Therapy (OT), 24 hour per day rehabilitation nursing, Therapeutic Recreaction (TR), Psychology, Neuropsychology, Care Coordinator, Rehabilitation Medicine, Nutrition Services, Pharmacy Services and Other  Weekly team conferences will be held on Tuesdays to discuss your progress.  Your Inpatient Rehabilitation Care Coordinator will talk with you frequently to get your input and to update you on team discussions.  Team conferences with you and your family in attendance may also be held.  Expected length of stay: 25-28 days   Overall anticipated outcome: Supervision  Depending on your progress and recovery, your program may change. Your Inpatient Rehabilitation Care Coordinator will coordinate services and will keep you informed of any changes. Your Inpatient Rehabilitation Care Coordinator's name and contact numbers are listed  below.  The following services may also be recommended but are not provided by the Inpatient Rehabilitation Center:   Driving Evaluations  Home Health Rehabiltiation Services  Outpatient Rehabilitation Services  Vocational Rehabilitation   Arrangements will be made to provide these services after discharge if needed.  Arrangements include referral to agencies that provide these services.  Your insurance has been verified to be:  [employer]  Your primary doctor is:  Paulino Door  Pertinent information will be shared with your doctor and your insurance company.  Inpatient Rehabilitation Care Coordinator:  Susie Cassette [phone] or [phone]  Information discussed with and copy given to patient by: Gretchen Short, 10/09/2020, 10:07 AM

## 2020-10-09 NOTE — Progress Notes (Signed)
Pt finished with supper at 1810 and bowel program started at 1815 with dig stim. Small amount mushy stool removed with dig stim and dulcolax suppository inserted. Assisted pt to toilet with heavy 2+ stedy. Another dig stim done while pt sitting on toilet pt able to have another small mushy stool. Ensured rectal vault clear. All together pt had moderate amount of stool with bowel program. Pt very anxious and c/o groin pain throughout. PRN tylenol administered prior to program, did not seem to be effective. Pt refused other stronger meds stating they cause her to hurt more.   Marylu Lund, RN

## 2020-10-09 NOTE — Progress Notes (Signed)
Physical Therapy Session Note  Patient Details  Name: Mary Davila MRN: [id number] Date of Birth: Mar 01, 1956  Today's Date: 10/09/2020 PT Individual Time: 1335-1435 PT Individual Time Calculation (min): 60 min   Short Term Goals: Week 1:  PT Short Term Goal 1 (Week 1): Pt will perform bed mobility with mod A consistently PT Short Term Goal 2 (Week 1): Pt will perform least restrictive transfer with mod A PT Short Term Goal 3 (Week 1): Pt will perform w/c mobility x 100 ft with Supervision PT Short Term Goal 4 (Week 1): Pt will initiate gait training as safe and able  Skilled Therapeutic Interventions/Progress Updates: Pt presented in w/c agreeable to therapy. Pt c/o pain but states is more manageable then yesterday. Pt expressed that she feels that they're just "pushing" pain meds on her, although agreed that MD has been receptive to changes. Providing edu on balancing meds so as to not let pain get ahead and require more intervention to get pain under control. Pt voiced understanding. Pt then transported to day room and participated in Cybex Kinetron from w/c at 90cm/sec 2 x 10 cycles. Pt was able to initiate pushing L>R and was able to complete cycle with PTA assist. Pt then transported to ortho gym and participated in standing frame x 5 min. Pt indicated no s/s of orthostatic hypotension and no c/o increased pain. Pt then transported back to room and performed SB transfer to bed with total A for SB set up and minA for actual transfer. Pt did require min cues to maintain head/hips relationship. Performed sit to supine with maxA (primarily for BLE management). Pt rolled to supine with modA (for BLE management). Pt left resting in bed with bed alarm on, call bell within reach and needs met.       Therapy Documentation Precautions:  Precautions Precautions: Back,Fall Precaution Comments: Reviewed back precautions Required Braces or Orthoses:  (no brace needed) Other Brace: no brace needed per  chart/orders Restrictions Weight Bearing Restrictions: No General:   Vital Signs: Therapy Vitals Temp: 98.1 F (36.7 C) Temp Source: Oral Pulse Rate: 92 Resp: 16 BP: 133/78 Patient Position (if appropriate): Sitting Oxygen Therapy SpO2: 96 % O2 Device: Room Air Pain: Pain Assessment Pain Scale: 0-10 Pain Score: 6  Pain Type: Acute pain Pain Location: Abdomen Pain Orientation: Lower Pain Descriptors / Indicators: Spasm Pain Frequency: Constant Pain Onset: On-going Patients Stated Pain Goal: 3 Pain Intervention(s): Medication (See eMAR) (tylenol and flexiril given) Mobility:   Locomotion :    Trunk/Postural Assessment :    Balance:   Exercises:   Other Treatments:      Therapy/Group: Individual Therapy  Banita Lehn 10/09/2020, 3:38 PM

## 2020-10-09 NOTE — Progress Notes (Addendum)
Inpatient Rehabilitation Care Coordinator Assessment and Plan Patient Details  Name: Noma Quijas MRN: [id number] Date of Birth: [birth year]  Today's Date: 10/09/2020  Hospital Problems: Principal Problem:   Acute incomplete paraplegia [facility]) Active Problems:   Lumbar disc herniation with myelopathy  Past Medical History:  Past Medical History:  Diagnosis Date  . Arthritis   . Asthma   . Constipation   . Hypertension   . Nocturia    Past Surgical History:  Past Surgical History:  Procedure Laterality Date  . ABDOMINAL HYSTERECTOMY    . BACK SURGERY    . EYE SURGERY    . JOINT REPLACEMENT Bilateral    bilateral hip replaced   Social History:  reports that she has never smoked. She has never used smokeless tobacco. No history on file for alcohol use and drug use.  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 Pt began SSDI Education: some college Read: Yes Write: Yes Employment Status: Disabled Date Retired/Disabled/Unemployed: 2014 Public relations account executive Issues: Denies Guardian/Conservator: N/A   Abuse/Neglect Abuse/Neglect Assessment Can Be Completed: Yes Physical Abuse: Denies Verbal Abuse: Denies Sexual Abuse: Denies Exploitation of patient/patient's resources: Denies Self-Neglect: Denies  Emotional Status Pt's affect, behavior and adjustment status: Pt in good spirits at time of visit. Pt discussed being in pain due to spasms. Recent Psychosocial Issues:  Denies Psychiatric History: pt admits to some depression around the passing of her husband in 2020 but did not require any medication or further support Substance Abuse History: Denies  Patient / Family Perceptions, Expectations & Goals Pt/Family understanding of illness & functional limitations: Pt has a general understanding of care needs at discharge Premorbid pt/family roles/activities: Independent Anticipated changes in roles/activities/participation: Assistance with ADLs/iADLs Pt/family expectations/goals: Pt goal is to try walking sinc esh ehas her upper body strnegth; and get pain under control so she can do more in rehab.  Community Resources [employer] Scripts: None Premorbid Home Care/DME Agencies: None Transportation available at discharge: dtr [employer] referrals recommended: Neuropsychology  Discharge Planning Living Arrangements: Children Support Systems: Children Type of Residence: Private residence Insurance Resources: Multimedia programmer (specify) ([employer] Medicare) Museum/gallery curator Resources: SSD Financial Screen Referred: No Living Expenses: Medical laboratory scientific officer Management: Patient Does the patient have any problems obtaining your medications?: No Home Management: Pt performed all home care needs Patient/Family Preliminary Plans: Dtr will assist Care Coordinator Barriers to Discharge: Decreased caregiver support,Lack of/limited family support Care Coordinator Anticipated Follow Up Needs: HH/OP Expected length of stay: 25-28 days  Clinical Impression Pt met with pt in room to introduce self, explain role, and discuss discharge process. Pt has no HCPOA. DME: cane and RW. Pt aware SW to follow-up with her dtr Alwyn Ren to introduce self/discuss support further.   SW made efforts to contact pt dtr Alwyn Ren [phone]) but voicemail full. SW will continue to make efforts.   Amely Voorheis A Caedan Sumler 10/09/2020, 4:13 PM

## 2020-10-10 ENCOUNTER — Inpatient Hospital Stay (HOSPITAL_COMMUNITY): Payer: Medicare HMO | Admitting: Occupational Therapy

## 2020-10-10 ENCOUNTER — Inpatient Hospital Stay (HOSPITAL_COMMUNITY): Payer: Medicare HMO | Admitting: Physical Therapy

## 2020-10-10 ENCOUNTER — Inpatient Hospital Stay (HOSPITAL_COMMUNITY): Payer: Medicare HMO

## 2020-10-10 LAB — URINE CULTURE: Culture: NO GROWTH

## 2020-10-10 NOTE — Progress Notes (Signed)
PHYSICAL MEDICINE & REHABILITATION PROGRESS NOTE   Subjective/Complaints:  Pt emphatic that keppra INREASED pain when she took it yesterday- which doesn't make sense, because this medication shouldn't cause INREASED pain- more likely tylenol had worn off and that's what caused increased pain- refused Keppra last night.   Per staff, pt got very confused last night with steady transfer- and got worse with emotional response.   Did get to toilet with bowel program- had good results.  Still requiring caths- no voiding so far.  Had sensation of needing to have BM this AM- when checked, already had one.   Needs prevalon boots- refusing PRAFOs.   Less groin pain going on.   ROS:  Pt denies SOB, abd pain, CP, N/V/C/D, and vision changes   Objective:   VAS [place] LOWER EXTREMITY VENOUS (DVT)  Result Date: 10/08/2020  Lower Venous DVT Study Indications: Immobility.  Risk Factors: Surgery Back Surgery 10/04/19. Anticoagulation: Heparin. Comparison Study: No previous exam Performing Technologist: Clint Guy RVT  Examination Guidelines: A complete evaluation includes B-mode imaging, spectral Doppler, color Doppler, and power Doppler as needed of all accessible portions of each vessel. Bilateral testing is considered an integral part of a complete examination. Limited examinations for reoccurring indications may be performed as noted. The reflux portion of the exam is performed with the patient in reverse Trendelenburg.  +---------+---------------+---------+-----------+----------+--------------+ RIGHT    CompressibilityPhasicitySpontaneityPropertiesThrombus Aging +---------+---------------+---------+-----------+----------+--------------+ CFV      Full           Yes      Yes                                 +---------+---------------+---------+-----------+----------+--------------+ SFJ      Full                                                         +---------+---------------+---------+-----------+----------+--------------+ FV Prox  Full                                                        +---------+---------------+---------+-----------+----------+--------------+ FV Mid   Full                                                        +---------+---------------+---------+-----------+----------+--------------+ FV DistalFull                                                        +---------+---------------+---------+-----------+----------+--------------+ PFV      Full                                                        +---------+---------------+---------+-----------+----------+--------------+  POP      Full           Yes      Yes                                 +---------+---------------+---------+-----------+----------+--------------+ PTV      Full                                                        +---------+---------------+---------+-----------+----------+--------------+ PERO     Full                                                        +---------+---------------+---------+-----------+----------+--------------+   +---------+---------------+---------+-----------+----------+--------------+ LEFT     CompressibilityPhasicitySpontaneityPropertiesThrombus Aging +---------+---------------+---------+-----------+----------+--------------+ CFV      Full           Yes      Yes                                 +---------+---------------+---------+-----------+----------+--------------+ SFJ      Full                                                        +---------+---------------+---------+-----------+----------+--------------+ FV Prox  Full                                                        +---------+---------------+---------+-----------+----------+--------------+ FV Mid   Full                                                         +---------+---------------+---------+-----------+----------+--------------+ FV DistalFull                                                        +---------+---------------+---------+-----------+----------+--------------+ PFV      Full                                                        +---------+---------------+---------+-----------+----------+--------------+ POP      Full           Yes      Yes                                 +---------+---------------+---------+-----------+----------+--------------+  PTV      Full                                                        +---------+---------------+---------+-----------+----------+--------------+ PERO     Full                                                        +---------+---------------+---------+-----------+----------+--------------+     Summary: BILATERAL: - No evidence of deep vein thrombosis seen in the lower extremities, bilaterally. - RIGHT: - A cystic structure is found in the popliteal fossa.  LEFT: - A cystic structure is found in the popliteal fossa.  *See table(s) above for measurements and observations. Electronically signed by Sherald Hess MD on 10/08/2020 at 6:10:01 PM.    Final    Recent Labs    10/07/20 1620  WBC 7.5  HGB 11.7*  HCT 34.8*  PLT 346   Recent Labs    10/07/20 1620 10/08/20 0623  NA 139 138  K 4.3 4.1  CL 100 99  CO2 28 29  GLUCOSE 127* 109*  BUN 19 17  CREATININE 1.07* 0.83  CALCIUM 9.0 9.1    Intake/Output Summary (Last 24 hours) at 10/10/2020 0911 Last data filed at 10/10/2020 0839 Gross per 24 hour  Intake 960 ml  Output 2922 ml  Net -1962 ml        Physical Exam: Vital Signs Blood pressure 120/73, pulse 99, temperature 98.4 F (36.9 C), temperature source Oral, resp. rate 18, height 5' 5.5" (1.664 m), weight 72.9 kg, SpO2 98 %.  Constitutional:  awake, alert, but confused about pain meds; NAD HENT: conjugate gaze Cardiovascular: RRR Pulmonary: CTA  B/L- no W/R/R- good air movement Abdominal: Soft, NT, ND, (+)BS    Genitourinary: foley out Musculoskeletal:     Comments: UE's 5/5 in deltoid, biceps, triceps, WE grip and finger abd LE's RLE- HF 2-/5, KE 3+/5, DF and PF 0/5 LLE- HF 1/5, KE 3+/5, DF and PF 0/5  Skin: back dressing in place - honeycomb- no drainage on dressing C/D/I- not assessed today Neurological: slowed processing- (per family, some is chronic) Ox3; depression vs slowed     Comments: Sensation decreased at T12  To S5- dramatic reduction in sensation in T11/12 but when eyes closed, wasn't sure where I was touching her from L1-S1 nor S2-S5 B/L No hoffman's B/L No increased tone at this time B/L foot drop as above - no change Psychiatric: depressed affect still, near tears  Assessment/Plan: 1. Functional deficits which require 3+ hours per day of interdisciplinary therapy in a comprehensive inpatient rehab setting.  Physiatrist is providing close team supervision and 24 hour management of active medical problems listed below.  Physiatrist and rehab team continue to assess barriers to discharge/monitor patient progress toward functional and medical goals  Care Tool:  Bathing    Body parts bathed by patient: Right arm,Left arm,Chest,Abdomen,Front perineal area,Face   Body parts bathed by helper: Buttocks,Right upper leg,Left upper leg,Right lower leg,Left lower leg     Bathing assist Assist Level: Moderate Assistance - Patient 50 - 74% (bed level)     Upper Body Dressing/Undressing Upper body dressing  Upper body dressing/undressing activity did not occur (including orthotics): Safety/medical concerns What is the patient wearing?: Pull over shirt    Upper body assist Assist Level: Supervision/Verbal cueing    Lower Body Dressing/Undressing Lower body dressing      What is the patient wearing?: Incontinence brief     Lower body assist Assist for lower body dressing: Total Assistance - Patient < 25%      Toileting Toileting Toileting Activity did not occur (Clothing management and hygiene only): N/A (no void or bm)  Toileting assist Assist for toileting: Total Assistance - Patient < 25%     Transfers Chair/bed transfer  Transfers assist  Chair/bed transfer activity did not occur: Safety/medical concerns  Chair/bed transfer assist level: Dependent - mechanical lift     Locomotion Ambulation   Ambulation assist   Ambulation activity did not occur: Safety/medical concerns          Walk 10 feet activity   Assist  Walk 10 feet activity did not occur: Safety/medical concerns        Walk 50 feet activity   Assist Walk 50 feet with 2 turns activity did not occur: Safety/medical concerns         Walk 150 feet activity   Assist Walk 150 feet activity did not occur: Safety/medical concerns         Walk 10 feet on uneven surface  activity   Assist Walk 10 feet on uneven surfaces activity did not occur: Safety/medical concerns         Wheelchair     Assist Will patient use wheelchair at discharge?: Yes Type of Wheelchair: Manual    Wheelchair assist level: Minimal Assistance - Patient > 75% Max wheelchair distance: 150    Wheelchair 50 feet with 2 turns activity    Assist        Assist Level: Minimal Assistance - Patient > 75%   Wheelchair 150 feet activity     Assist  Wheelchair 150 feet activity did not occur: Safety/medical concerns   Assist Level: Minimal Assistance - Patient > 75%   Blood pressure 120/73, pulse 99, temperature 98.4 F (36.9 C), temperature source Oral, resp. rate 18, height 5' 5.5" (1.664 m), weight 72.9 kg, SpO2 98 %.  Medical Problem List and Plan: 1.  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  Antithrombotics: -DVT/anticoagulation:   Pharmaceutical: Heparin 1/20- changed to Lovenox             -antiplatelet therapy: N/a 3. Pain Management: Currently using hydrocodone with flexeril prn- changed from robaxin, since wasn't working             ---Continue Neurontin 300 mg tid for neuropathy but added Cymbalta 20 mg QHS for nerve pain.   1/19- pt doesn't want an antidepressant med- explained it's for nerve pain-   1/20- pt reports pain meds make her hurt "worse"- refusing vicodin and "allergic to tramadol" and refused cymbalta- says gabapentin causes swelling- will con't tylenol prn- cannot do excedrin due to bleeding risk  1/21- encouraged pt to continue taking keppra since I don't think it's the cause of increased pain- I think it's meds wearing off- that's more likely- explained to pt.  4. Mood: LCSW to follow for  evaluation and support.              -antipsychotic agents:  N/A 5. Neuropsych: This patient is capable of making decisions on her own behalf. 6. Skin/Wound Care: Monitor wound for healing.  7. Fluids/Electrolytes/Nutrition: Monitor I/O. Check lytes in am.  8.  HTN: Monitor BP tid--having HYPOTENSION per therapy notes and pt- will stop Norvasc 5 mg daily and see if does ok- might need midodrine for hypotension   1/19- BP very slightly high- due to off Norvasc- but hopefully will help orthostatic hypotension- will monitor  1/20- will restart Norvasc 2.5 mg daily since BP 156/91 this AM  1/21- BP 120/73 this AM- better with low dose Norvasc 9. Hypokalemia: Preadmission labs showed K+2.9. ON Kdur 40 meq bid--check follow up labs today. Fluids d/c yesterday.  10. Mild anemia: Recheck CBC for post op labs--likely has ABLA.   11.  COPD: Respiratory status stable on Dulera bid. Albuterol prn SOB.  12. Neurogenic bladder:  Will d/c foley  In a couple of days- right now cannot tolerate Flomax due to low BP issues- will give it a few days, since having to start to deal with bowel program today- will remove Friday vs Monday-    1/19- foley removed- will see if can void- unlikely- if does, great- if doesn't, will put back pending trying to start Flomax once got BP better controlled  1/21- BP OK- will try to start Flomax at night- has a small chance of working, but it's possible.  13. Neurogenic bowel: Has had constipation for past few months (goes once a week). She had multiple BMs (hard balls -->liquid) yesterday after mag citrate. Will order KUB to check stool burden.   Will continue Senna S with miralax daily. Schedule suppository after supper.     - will make sure we start bowel program this evening- discussed at length with pt about bowel program.  1/19-  Pt had medium BM with bowel program last night- con't daily.   1/20- large BM with bowel program as well as small BM this AM  1/21- BM with bowel program on toilet last night- has results.  14. At level SCI pain- starting Cymbalta 20 mg QHS and will titrate up. Already on gabapentin but not working so far.  1/20- changed to keppra 250 mg BID for nerve pain  1/21- pt doesn't want to take- I asked her oto continue.  15. Low grade fever this AM  1/19- WBC normal- no left shift seen- get U/A and Cx- and monitor- if U/A (+), will start PO ABX.   1/20- U/A (-)    LOS: 3 days A FACE TO FACE EVALUATION WAS PERFORMED  Carletta Feasel 10/10/2020, 9:11 AM

## 2020-10-10 NOTE — Progress Notes (Signed)
Physical Therapy Session Note  Patient Details  Name: Mary Davila MRN: [id number] Date of Birth: [birth year]  Today's Date: 10/10/2020 PT Individual Time: 1300-1355 PT Individual Time Calculation (min): 55 min   Short Term Goals: Week 1:  PT Short Term Goal 1 (Week 1): Pt will perform bed mobility with mod A consistently PT Short Term Goal 2 (Week 1): Pt will perform least restrictive transfer with mod A PT Short Term Goal 3 (Week 1): Pt will perform w/c mobility x 100 ft with Supervision PT Short Term Goal 4 (Week 1): Pt will initiate gait training as safe and able  Skilled Therapeutic Interventions/Progress Updates:    Patient received supine in bed, agreeable to PT. She continues to report "moderate" pain in groin, especially with movement. Premedicated. PT providing rest breaks, distractions and repositioning to assist with pain management. She was able to come sit edge of bed with ModA and HOB elevated. TotalA to place slideboard, but CGA to complete slideboard transfer. PT propelling patient in wc to therapy gym for time management and energy conservation. She was able to come to stand in [place] with MaxA and verbal/tactile cues to engage glutes. Heavy reliance on B UE noted. Once perched on Stedy, blocked practice sit <> stand when emphasis on posterior chain engagement for fully upright posturing. Mirror provided for visual feedback of posture. Initially, patient with B forearms on Stedy bar resulting in very flexed upper body posture, despite fully extended B LE. Patient able to progress to hands on Stedy bar (instead of forearms) with less reliance on UE to come to standing. Patient did require extended seated rest breaks between bouts due to fatigue and SOB. Patient notes that she hasn't been wearing PRAFO boots at night. PT reemphasizing importance of adhering to wearing schedule to prevent plantarflexor contracture and excessive supination. Patient already demonstrating tendency to  supinate R foot in standing. Patient returning to room in wc, transferring back to bed, per NT request to cath patient. MinA slideboard transfer with ModA to return supine. Bed alarm on, call light within reach.   Therapy Documentation Precautions:  Precautions Precautions: Back,Fall Precaution Comments: Reviewed back precautions Required Braces or Orthoses:  (no brace needed) Other Brace: no brace needed per chart/orders Restrictions Weight Bearing Restrictions: No    Therapy/Group: Individual Therapy  Elizebeth Koller, PT, DPT, CBIS  10/10/2020, 7:41 AM

## 2020-10-10 NOTE — Progress Notes (Signed)
Bowel program started at 1820 with dig stim. Smear of stool noted with dig stim. Dulcolax suppository given and pt assisted to toilet with 2+ stedy at 1840. Another dig stim x2 done while pt on toilet. No result noted. NO result at this time, and rectal vault is clear. Pt assisted back to bed and repositioned to comfort. PRN tylenol given at 1643 to prep pt for program which seem to help, as pt was able to move better compared to yesterday.   Marylu Lund, RN

## 2020-10-10 NOTE — Progress Notes (Signed)
Orthopedic Tech Progress Note Patient Details:  Mary Davila 31-Mar-1956 [id number] Saw that there was an order for [employer]OT[employer] called to floor and told them those are in materials Patient ID: Mary Davila, female   DOB: 06-Jun-1956, 65 y.o.   MRN: [id number]   Mary Davila 10/10/2020, 8:40 AM

## 2020-10-10 NOTE — IPOC Note (Signed)
Overall Plan of Care [facility]) Patient Details Name: Mary Davila MRN: [id number] DOB: 04/09/1956  Admitting Diagnosis: Acute incomplete paraplegia [facility])  Hospital Problems: Principal Problem:   Acute incomplete paraplegia (HCC) Active Problems:   Lumbar disc herniation with myelopathy     Functional Problem List: Nursing Bladder,Bowel,Endurance,Motor,Pain,Medication Management  PT Balance,Endurance,Motor,Pain,Safety,Sensory  OT Balance,Motor,Sensory,Pain,Safety,Endurance  SLP    TR         Basic ADL's: OT Bathing,Dressing,Toileting     Advanced  ADL's: OT Simple Meal Preparation,Light Housekeeping     Transfers: PT Bed Mobility,Bed to Chair,Car,Furniture,Floor  OT Toilet,Tub/Shower     Locomotion: PT Ambulation,Wheelchair Mobility,Stairs     Additional Impairments: OT None  SLP        TR      Anticipated Outcomes Item Anticipated Outcome  Self Feeding no goal set  Swallowing      Basic self-care  supervision  Toileting  supervision   Bathroom Transfers supervision  Bowel/Bladder  manage bowel and bladder with min assist  Transfers  Supervision  Locomotion  Supervision with LRAD  Communication     Cognition     Pain  pain level less than 4 on scale of 0-10  Safety/Judgment  remain free of injury, prevent falls with min assist   Therapy Plan: PT Intensity: Minimum of 1-2 x/day ,45 to 90 minutes PT Frequency: 5 out of 7 days PT Duration Estimated Length of Stay: 25-28 days OT Intensity: Minimum of 1-2 x/day, 45 to 90 minutes OT Frequency: 5 out of 7 days OT Duration/Estimated Length of Stay: 3-4 weeks     Due to the current state of emergency, patients may not be receiving their 3-hours of Medicare-mandated therapy.   Team Interventions: Nursing Interventions Patient/Family Education,Pain Management,Discharge Planning,Medication Management,Bladder Management,Bowel Management,Psychosocial Support,Disease Management/Prevention  PT  interventions Ambulation/gait training,Balance/vestibular training,Community reintegration,Discharge planning,Disease Engineer, production stimulation,Functional mobility training,Neuromuscular re-education,Pain management,Patient/family education,Psychosocial support,Splinting/orthotics,Therapeutic Activities,Therapeutic Exercise,UE/LE Strength taining/ROM,UE/LE Psychiatrist propulsion/positioning  OT Interventions Balance/vestibular training,Community reintegration,Discharge planning,DME/adaptive equipment instruction,Functional mobility training,Pain management,Psychosocial support,Therapeutic Activities,Wheelchair propulsion/positioning,UE/LE Coordination activities,Therapeutic Exercise,Splinting/orthotics,Self Care/advanced ADL retraining,Patient/family education,Neuromuscular re-education,Functional electrical stimulation,UE/LE Strength taining/ROM  SLP Interventions    TR Interventions    SW/CM Interventions Patient/Family Education,Discharge Planning,Psychosocial Support   Barriers to Discharge MD  Medical stability, Home enviroment access/loayout, Incontinence, Neurogenic bowel and bladder, Wound care, Lack of/limited family support, Weight bearing restrictions and Behavior  Nursing      PT Decreased caregiver support,Incontinence,Neurogenic Bowel & Bladder    OT Decreased caregiver support unclear how long daughter will stay  SLP      SW Decreased caregiver support,Lack of/limited family support     Team Discharge Planning: Destination: PT-Home ,OT- Home , SLP-  Projected Follow-up: PT-Home health PT, OT-  Home health OT, SLP-  Projected Equipment Needs: PT-To be determined, OT- Tub/shower bench, SLP-  Equipment Details: PT-TBD pending progress, OT-  Patient/family involved in discharge planning: PT- Patient,  OT-Patient, SLP-   MD ELOS: 25-28 days Medical Rehab Prognosis:  Good Assessment: Pt is a  65 yr old female s/o microdiscectomy  With likely acute herniation after surgery that now has incomplete vs complete paraplegia- T11/12-  Has neurogenic bowel and bladder, and no sensation in S2-S5 area.  Per sister, has some  slowed processing, some chronic, some  new due to trauma of this situation. Having orthostatic hypotension and at level SCI pain.   Goals Supervision maybe min A-  In 25-28 days   See Team Conference Notes for weekly updates to the plan of care

## 2020-10-10 NOTE — Progress Notes (Signed)
Physical Therapy Session Note  Patient Details  Name: Mary Davila MRN: [id number] Date of Birth: 1956-07-31  Today's Date: 10/10/2020 PT Individual Time: 0905-1019 PT Individual Time Calculation (min): 74 min   Short Term Goals: Week 1:  PT Short Term Goal 1 (Week 1): Pt will perform bed mobility with mod A consistently PT Short Term Goal 2 (Week 1): Pt will perform least restrictive transfer with mod A PT Short Term Goal 3 (Week 1): Pt will perform w/c mobility x 100 ft with Supervision PT Short Term Goal 4 (Week 1): Pt will initiate gait training as safe and able  Skilled Therapeutic Interventions/Progress Updates:  Pt presented in bed agreeable to therapy. Pt states pain 6/10 at rest increases to 8/10 with mobility. PTA threaded pants total A and pt performed rolling L/R with mod A and pulled pants over hips with modA. Performed supine to sit with mod/maxA and heavy use of bed features. PTA set up SB and performed SB transfer to w/c (to L) with minA and multimodal cues for improved head/hips relationship. Pt moved over to sink and participated in oral hygiene and washed upper body at sink with set up. Pt propelled to rehab gym with supervision and x 2 brief rest breaks. Performed slideboard transfer to mat with minA (total A for set up). Participated in sitting balance activities including shoulder flexion to 90 with 2lb dowel and ball catches encouraging use of BUE. Pt noted with mild instability initially with activities but was able to complete all tasks without LOB. Participated in STS from elevated surface in [place] requiring maxA. First attempt pt unable to improve posture and thus leaning on Stedy for support. Second attempt pt able to improve trunk posture however unable to [facility] fully erect posture. Pt indicated no increased pain during standing attempts which she was pleased by. Pt performed SB transfer to R with total A for set up but CGA for transfer itself. Pt transported back to room  for time management as NT requesting return to bed for bladder scan. Pt transferred back to bed via slide board in same manner as prior. Pt required mod/maxA for sit to supine for BLE management only. Pt left in bed at end of session with bed alarm on, call bell within reach and needs met.      Therapy Documentation Precautions:  Precautions Precautions: Back,Fall Precaution Comments: Reviewed back precautions Required Braces or Orthoses:  (no brace needed) Other Brace: no brace needed per chart/orders Restrictions Weight Bearing Restrictions: No General:   Vital Signs:   Pain: Pain Assessment Pain Scale: 0-10 Pain Score: 5  Pain Type: Acute pain Pain Location: Groin Pain Descriptors / Indicators: Spasm Pain Frequency: Intermittent Pain Onset: On-going Patients Stated Pain Goal: 4 Pain Intervention(s): Medication (See eMAR) (tylenol, flexiril given)    Therapy/Group: Individual Therapy  Rachelle Edwards  Torryn Fiske, PTA  10/10/2020, 1:00 PM

## 2020-10-10 NOTE — Progress Notes (Signed)
Occupational Therapy Session Note  Patient Details  Name: Mary Davila MRN: [id number] Date of Birth: 07-26-1956  Today's Date: 10/10/2020 OT Individual Time: 4098-1191 OT Individual Time Calculation (min): 42 min   Short Term Goals: Week 1:  OT Short Term Goal 1 (Week 1): Pt will maintain sitting balance EOB during ADLs with min A and no use of hand rails OT Short Term Goal 2 (Week 1): Pt will don LB clothing at bed level with mod A OT Short Term Goal 3 (Week 1): Pt will roll R and L with use of bed features with mod A during LB dressing  Skilled Therapeutic Interventions/Progress Updates:    Pt greeted in the bed, premedicated for pain and was sore from recent cath. She requested bedlevel therapy due to LE pain/fatigue. Worked on UB strength and endurance while adhering to back precautions via modified dancing to meaningful gospel music. Pt required several rest breaks due to fatigue. Also incorporated self ROM using the leg lifter, pt able to dorsiflex foot, flex hip, and abduction/adduct hip bilaterally. At end of session pt remained in bed with all needs within reach and bed alarm set.   Therapy Documentation Precautions:  Precautions Precautions: Back,Fall Precaution Comments: Reviewed back precautions Required Braces or Orthoses:  (no brace needed) Other Brace: no brace needed per chart/orders Restrictions Weight Bearing Restrictions: No Pain: Pain Assessment Pain Scale: 0-10 Pain Score: 5  Pain Type: Acute pain Pain Location: Groin Pain Descriptors / Indicators: Spasm Pain Frequency: Intermittent Pain Onset: On-going Patients Stated Pain Goal: 4 Pain Intervention(s): Medication (See eMAR) (tylenol, flexiril given) ADL: ADL Eating: Set up Grooming: Setup Where Assessed-Grooming: Bed level Upper Body Bathing: Minimal assistance,Minimal cueing Where Assessed-Upper Body Bathing: Edge of bed Lower Body Bathing: Maximal assistance Where Assessed-Lower Body Bathing: Bed  level Upper Body Dressing: Minimal assistance Where Assessed-Upper Body Dressing: Edge of bed Lower Body Dressing: Maximal assistance Where Assessed-Lower Body Dressing: Bed level Toileting: Unable to assess Toilet Transfer: Unable to assess Toilet Transfer Method: Unable to assess Tub/Shower Transfer: Unable to assess      Therapy/Group: Individual Therapy  Caci Orren A Retal Tonkinson 10/10/2020, 3:32 PM

## 2020-10-11 MED ORDER — TAMSULOSIN HCL 0.4 MG PO CAPS
0.4000 mg | ORAL_CAPSULE | Freq: Every day | ORAL | Status: DC
  Administered 2020-10-11 – 2020-10-18 (×8): 0.4 mg via ORAL
  Filled 2020-10-11 (×8): qty 1

## 2020-10-11 MED ORDER — MIDODRINE HCL 5 MG PO TABS
2.5000 mg | ORAL_TABLET | Freq: Three times a day (TID) | ORAL | Status: DC
  Administered 2020-10-11 – 2020-10-19 (×23): 2.5 mg via ORAL
  Filled 2020-10-11 (×23): qty 1

## 2020-10-11 NOTE — Progress Notes (Signed)
[facility] PHYSICAL MEDICINE & REHABILITATION PROGRESS NOTE   Subjective/Complaints:   Pt reports she's upset about the whole situation- found to be tearful this AM.  Also hasn't been able to void yet.  Small BM last night- had accident this Am ~ 8am as well.   Since BP doing a little better, will add Flomax and see if will help pt void.    ROS:  Pt denies SOB, abd pain, CP, N/V/C/D, and vision changes   Objective:   No results found. No results for input(s): WBC, HGB, HCT, PLT in the last 72 hours. No results for input(s): NA, K, CL, CO2, GLUCOSE, BUN, CREATININE, CALCIUM in the last 72 hours.  Intake/Output Summary (Last 24 hours) at 10/11/2020 1307 Last data filed at 10/11/2020 0830 Gross per 24 hour  Intake 580 ml  Output 2300 ml  Net -1720 ml        Physical Exam: Vital Signs Blood pressure 128/72, pulse 85, temperature 98.4 F (36.9 C), resp. rate 16, height 5' 5.5" (1.664 m), weight 72.9 kg, SpO2 100 %.  Constitutional:  awake, laying on side in bed; tearful/sobbing, NAD HENT: conjugate gaze Cardiovascular: RRR Pulmonary: CTA B/L- no W/R/R- good air movement Abdominal: Soft, NT, ND, (+)BS   Genitourinary: foley out Musculoskeletal:     Comments: UE's 5/5 in deltoid, biceps, triceps, WE grip and finger abd LE's RLE- HF 2-/5, KE 3+/5, DF and PF 0/5 LLE- HF 1/5, KE 3+/5, DF and PF 0/5  Skin: back dressing in place - honeycomb- no drainage on dressing C/D/I- not assessed today Neurological: slowed processing- (per family, some is chronic) very depressed/flat/tearful affect    Comments: Sensation decreased at T12  To S5- dramatic reduction in sensation in T11/12 but when eyes closed, wasn't sure where I was touching her from L1-S1 nor S2-S5 B/L No hoffman's B/L No increased tone at this time B/L foot drop as above - no change Psychiatric: sobbing/crying so much at times.   Assessment/Plan: 1. Functional deficits which require 3+ hours per day of  interdisciplinary therapy in a comprehensive inpatient rehab setting.  Physiatrist is providing close team supervision and 24 hour management of active medical problems listed below.  Physiatrist and rehab team continue to assess barriers to discharge/monitor patient progress toward functional and medical goals  Care Tool:  Bathing    Body parts bathed by patient: Right arm,Left arm,Chest,Abdomen,Front perineal area,Face   Body parts bathed by helper: Buttocks,Right upper leg,Left upper leg,Right lower leg,Left lower leg     Bathing assist Assist Level: Moderate Assistance - Patient 50 - 74% (bed level)     Upper Body Dressing/Undressing Upper body dressing Upper body dressing/undressing activity did not occur (including orthotics): Safety/medical concerns What is the patient wearing?: Pull over shirt    Upper body assist Assist Level: Supervision/Verbal cueing    Lower Body Dressing/Undressing Lower body dressing      What is the patient wearing?: Incontinence brief     Lower body assist Assist for lower body dressing: Total Assistance - Patient < 25%     Toileting Toileting Toileting Activity did not occur (Clothing management and hygiene only): N/A (no void or bm)  Toileting assist Assist for toileting: Total Assistance - Patient < 25%     Transfers Chair/bed transfer  Transfers assist  Chair/bed transfer activity did not occur: Safety/medical concerns  Chair/bed transfer assist level: Minimal Assistance - Patient > 75% Chair/bed transfer assistive device: Sliding board   Locomotion Ambulation   Ambulation  assist   Ambulation activity did not occur: Safety/medical concerns          Walk 10 feet activity   Assist  Walk 10 feet activity did not occur: Safety/medical concerns        Walk 50 feet activity   Assist Walk 50 feet with 2 turns activity did not occur: Safety/medical concerns         Walk 150 feet activity   Assist Walk 150  feet activity did not occur: Safety/medical concerns         Walk 10 feet on uneven surface  activity   Assist Walk 10 feet on uneven surfaces activity did not occur: Safety/medical concerns         Wheelchair     Assist Will patient use wheelchair at discharge?: Yes Type of Wheelchair: Manual    Wheelchair assist level: Supervision/Verbal cueing Max wheelchair distance: 150    Wheelchair 50 feet with 2 turns activity    Assist        Assist Level: Supervision/Verbal cueing   Wheelchair 150 feet activity     Assist      Assist Level: Supervision/Verbal cueing   Blood pressure 128/72, pulse 85, temperature 98.4 F (36.9 C), resp. rate 16, height 5' 5.5" (1.664 m), weight 72.9 kg, SpO2 100 %.  Medical Problem List and Plan: 1.  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 - appears to be complete at S2-S5/ Paraplegia  secondary to acute herniation?- nontraumatic paraplegia             -patient may  Shower if covers incision             -ELOS/Goals: 20-24 days- min Assist if possible 2.  Antithrombotics: -DVT/anticoagulation:  Pharmaceutical: Heparin 1/20- changed to Lovenox             -antiplatelet therapy: N/a 3. Pain Management: Currently using hydrocodone with flexeril prn- changed from robaxin, since wasn't working             ---Continue Neurontin 300 mg tid for neuropathy but added Cymbalta 20 mg QHS for nerve pain.   1/19- pt doesn't want an antidepressant med- explained it's for nerve pain-   1/20- pt reports pain meds make her hurt "worse"- refusing vicodin and "allergic to tramadol" and refused cymbalta- says gabapentin causes swelling- will con't tylenol prn- cannot do excedrin due to bleeding risk  1/21- encouraged pt to continue taking keppra since I don't think it's the cause of increased pain- I think it's meds wearing off- that's more likely- explained to pt.  4. Mood: LCSW to follow for evaluation and support.              -antipsychotic agents:  N/A 5.  Neuropsych: This patient is capable of making decisions on her own behalf. 6. Skin/Wound Care: Monitor wound for healing.  7. Fluids/Electrolytes/Nutrition: Monitor I/O. Check lytes in am.  8.  HTN: Monitor BP tid--having HYPOTENSION per therapy notes and pt- will stop Norvasc 5 mg daily and see if does ok- might need midodrine for hypotension   1/19- BP very slightly high- due to off Norvasc- but hopefully will help orthostatic hypotension- will monitor  1/20- will restart Norvasc 2.5 mg daily since BP 156/91 this AM  1/21- BP 120/73 this AM- better with low dose Norvasc  1/22- BP 120s/but need to start Flomax- so will start Midodrine 2.5 mg TID with meals, so leaves room in BP for flomax to lower BP.  9. Hypokalemia: Preadmission labs  showed K+2.9. ON Kdur 40 meq bid--check follow up labs today. Fluids d/c yesterday.  10. Mild anemia: Recheck CBC for post op labs--likely has ABLA.   11.  COPD: Respiratory status stable on Dulera bid. Albuterol prn SOB.  12. Neurogenic bladder:  Will d/c foley  In a couple of days- right now cannot tolerate Flomax due to low BP issues- will give it a few days, since having to start to deal with bowel program today- will remove Friday vs Monday-   1/19- foley removed- will see if can void- unlikely- if does, great- if doesn't, will put back pending trying to start Flomax once got BP better controlled  1/21- BP OK- will try to start Flomax at night- has a small chance of working, but it's possible.   1/22- will start Flomax- per above about Midodrine so can tolerate Flomax.  13. Neurogenic bowel: Has had constipation for past few months (goes once a week). She had multiple BMs (hard balls -->liquid) yesterday after mag citrate. Will order KUB to check stool burden.   Will continue Senna S with miralax daily. Schedule suppository after supper.     - will make sure we start bowel program this evening- discussed at length with pt about bowel program.  1/19-  Pt had  medium BM with bowel program last night- con't daily.   1/20- large BM with bowel program as well as small BM this AM  1/21- BM with bowel program on toilet last night- has results.   1/22- no BM with bowel program- had accident this AM 14. At level SCI pain- starting Cymbalta 20 mg QHS and will titrate up. Already on gabapentin but not working so far.  1/20- changed to keppra 250 mg BID for nerve pain  1/21- pt doesn't want to take- I asked her oto continue.  15. Low grade fever this AM  1/19- WBC normal- no left shift seen- get U/A and Cx- and monitor- if U/A (+), will start PO ABX.   1/20- U/A (-)  1/22- no more fevers 16. Severe depression  1/22- has refused anti-depressants so far-     LOS: 4 days A FACE TO FACE EVALUATION WAS PERFORMED  Chauntel Windsor 10/11/2020, 1:07 PM

## 2020-10-12 ENCOUNTER — Inpatient Hospital Stay (HOSPITAL_COMMUNITY): Payer: Medicare HMO

## 2020-10-12 MED ORDER — PAROXETINE HCL 10 MG PO TABS
10.0000 mg | ORAL_TABLET | Freq: Every day | ORAL | Status: DC
  Administered 2020-10-12 – 2020-11-06 (×24): 10 mg via ORAL
  Filled 2020-10-12 (×27): qty 1

## 2020-10-12 MED ORDER — LEVETIRACETAM 500 MG PO TABS
500.0000 mg | ORAL_TABLET | Freq: Two times a day (BID) | ORAL | Status: DC
  Administered 2020-10-12 – 2020-10-29 (×34): 500 mg via ORAL
  Filled 2020-10-12 (×38): qty 1

## 2020-10-12 NOTE — Progress Notes (Signed)
Bowel program is completed. 2nd dig stim was done, no more BM, no stool in rectum. Peri care done. Pt tolerated well.

## 2020-10-12 NOTE — Progress Notes (Addendum)
Physical Therapy Session Note  Patient Details  Name: Mary Davila MRN: [id number] Date of Birth: 06-May-1956  Today's Date: 10/12/2020 PT Individual Time: 1300-1350 PT Individual Time Calculation (min): 50 min   Short Term Goals: Week 1:  PT Short Term Goal 1 (Week 1): Pt will perform bed mobility with mod A consistently PT Short Term Goal 2 (Week 1): Pt will perform least restrictive transfer with mod A PT Short Term Goal 3 (Week 1): Pt will perform w/c mobility x 100 ft with Supervision PT Short Term Goal 4 (Week 1): Pt will initiate gait training as safe and able  Skilled Therapeutic Interventions/Progress Updates:  Pt resting in bed.  She described pain bil groin 6/10, premedicated.    neuromuscular re-education in supine- gentle AROM bil hips, ankles; bil hip active external/internal rotation>< midline;  R/L ankle plantar/toe flexion, bil quad sets, bil adductor squeezes.  PT educated pt in relaxation imagery and diaphragmatic breathing for anxiety reduction, with good carry over.  With [facility] raised, supine> sit with mod assist and mod cues.  Slide board transfer with min assist and cues for head/hips relationship with spinal precautions.  PT placed a pillow lengthwise behind pt's back, which she stated felt better to her hips/LB, and pillow between knees to reduce groin muscle spasms.   At end of session, seat belt alarm set and needs left at hand.       Therapy Documentation Precautions:  Precautions Precautions: Back,Fall Precaution Comments: Reviewed back precautions Required Braces or Orthoses:  (no brace needed) Other Brace: no brace needed per chart/orders Restrictions Weight Bearing Restrictions: No       Therapy/Group: Individual Therapy  Zakariah Urwin 10/12/2020, 4:24 PM

## 2020-10-12 NOTE — Progress Notes (Signed)
Bowel program done per order. Small mushy bowel after first dig stim round, then suppository placed.

## 2020-10-12 NOTE — Progress Notes (Signed)
Occupational Therapy Session Note  Patient Details  Name: Mary Davila MRN: [id number] Date of Birth: 13-Feb-1956  Today's Date: 10/12/2020 OT Individual Time: 2761-4709 OT Individual Time Calculation (min): 75 min    Short Term Goals: Week 1:  OT Short Term Goal 1 (Week 1): Pt will maintain sitting balance EOB during ADLs with min A and no use of hand rails OT Short Term Goal 2 (Week 1): Pt will don LB clothing at bed level with mod A OT Short Term Goal 3 (Week 1): Pt will roll R and L with use of bed features with mod A during LB dressing  Skilled Therapeutic Interventions/Progress Updates:    Pt received supine with c/o pain in her hips/groin with movement, reporting she is premedicated. Discussed shower and pt agreeable following OT demo re TTB use and lengthy explanation re safety measures and fall risk reduction. Pt completed bed mobility with increased time for pain management/rest breaks and min A overall. She used slideboard to transfer to w/c with min A. Min A to transfer into shower as well. Max A to doff socks and incontinence brief with lateral leans. Pt required intermittent unilateral support on a grab bar throughout bathing, no LOB. Max A for LB bathing, with discussion re use of LH sponge for the next session. Pt reporting hot water alleviated hip/groin pain significantly. Pt still with significant anxiety and reduced self efficacy throughout session with emotional support and encouragement provided. Pt required assist to wash her bottom d/t balance deficits requiring UE reliance during lateral lean. Bowel incontinence present. Pt transferred via slideboard back to the w/c with more assist required 2/2 more friction limiting transfer, mod A. Pt completed oral care and grooming at the sink with set up assist. She returned to supine in bed with mod A for lifting BLE. She required total A to don new brief as she rolled R and L with min A. Cueing for back precaution adherence. Pt was left  supine with all needs met bed alarm set.   Therapy Documentation Precautions:  Precautions Precautions: Back,Fall Precaution Comments: Reviewed back precautions Required Braces or Orthoses:  (no brace needed) Other Brace: no brace needed per chart/orders Restrictions Weight Bearing Restrictions: No Therapy/Group: Individual Therapy  Curtis Sites 10/12/2020, 6:41 AM

## 2020-10-12 NOTE — Progress Notes (Signed)
[place] PHYSICAL MEDICINE & REHABILITATION PROGRESS NOTE   Subjective/Complaints:   Pt reports doing a little better mood wise than yesterday when was crying a lot- and is scared of taking SSRIs, but "everything makes my pain worse'.   Wants to do more, but pain biggest limiter.  Still having sharp pains form hips, down legs- only will take tylenol.   Bowel program "OK"- but not on toilet last night-  She's upset about it, but will try a different SSRI and let me increase Keppra for nerve pain.   No voiding at all so far. Still getting I/o caths done to empty bladder.  ROS:  Pt denies SOB, abd pain, CP, N/V/C/D, and vision changes   Objective:   No results found. No results for input(s): WBC, HGB, HCT, PLT in the last 72 hours. No results for input(s): NA, K, CL, CO2, GLUCOSE, BUN, CREATININE, CALCIUM in the last 72 hours.  Intake/Output Summary (Last 24 hours) at 10/12/2020 1323 Last data filed at 10/12/2020 1245 Gross per 24 hour  Intake 600 ml  Output 3000 ml  Net -2400 ml        Physical Exam: Vital Signs Blood pressure 116/81, pulse 92, temperature 98.4 F (36.9 C), resp. rate 16, height 5' 5.5" (1.664 m), weight 72.9 kg, SpO2 96 %.  Constitutional: awake, laying supine in bed; still near tears, but not sobbing today, NAD HENT: conjugate gaze Cardiovascular: RRR Pulmonary: CTA B/L- no W/R/R- good air movement Abdominal: Soft, NT, ND, (+)BS  Genitourinary: foley out Musculoskeletal:     Comments: UE's 5/5 in deltoid, biceps, triceps, WE grip and finger abd LE's RLE- HF 2-/5, KE 3+/5, DF and PF 0/5 LLE- HF 1/5, KE 3+/5, DF and PF 0/5  Skin: back dressing in place - honeycomb- no drainage on dressing C/D/I- not assessed today Neurological: slowed processing- (per family, some is chronic) very depressed/flat/tearful affect    Comments: Sensation decreased at T12  To S5- dramatic reduction in sensation in T11/12 but when eyes closed, wasn't sure where I was  touching her from L1-S1 nor S2-S5 B/L No hoffman's B/L No increased tone at this time B/L foot drop as above - no change Psychiatric: very depressed- feels her life is over.   Assessment/Plan: 1. Functional deficits which require 3+ hours per day of interdisciplinary therapy in a comprehensive inpatient rehab setting.  Physiatrist is providing close team supervision and 24 hour management of active medical problems listed below.  Physiatrist and rehab team continue to assess barriers to discharge/monitor patient progress toward functional and medical goals  Care Tool:  Bathing    Body parts bathed by patient: Right arm,Left arm,Chest,Abdomen,Front perineal area,Face,Right upper leg,Left upper leg   Body parts bathed by helper: Right lower leg,Left lower leg,Buttocks     Bathing assist Assist Level: Moderate Assistance - Patient 50 - 74%     Upper Body Dressing/Undressing Upper body dressing Upper body dressing/undressing activity did not occur (including orthotics): Safety/medical concerns What is the patient wearing?: Pull over shirt    Upper body assist Assist Level: Supervision/Verbal cueing    Lower Body Dressing/Undressing Lower body dressing      What is the patient wearing?: Incontinence brief     Lower body assist Assist for lower body dressing: Total Assistance - Patient < 25%     Toileting Toileting Toileting Activity did not occur (Clothing management and hygiene only): N/A (no void or bm)  Toileting assist Assist for toileting: Total Assistance - Patient <  25%     Transfers Chair/bed transfer  Transfers assist  Chair/bed transfer activity did not occur: Safety/medical concerns  Chair/bed transfer assist level: Minimal Assistance - Patient > 75% Chair/bed transfer assistive device: Sliding board   Locomotion Ambulation   Ambulation assist   Ambulation activity did not occur: Safety/medical concerns          Walk 10 feet  activity   Assist  Walk 10 feet activity did not occur: Safety/medical concerns        Walk 50 feet activity   Assist Walk 50 feet with 2 turns activity did not occur: Safety/medical concerns         Walk 150 feet activity   Assist Walk 150 feet activity did not occur: Safety/medical concerns         Walk 10 feet on uneven surface  activity   Assist Walk 10 feet on uneven surfaces activity did not occur: Safety/medical concerns         Wheelchair     Assist Will patient use wheelchair at discharge?: Yes Type of Wheelchair: Manual    Wheelchair assist level: Supervision/Verbal cueing Max wheelchair distance: 150    Wheelchair 50 feet with 2 turns activity    Assist        Assist Level: Supervision/Verbal cueing   Wheelchair 150 feet activity     Assist      Assist Level: Supervision/Verbal cueing   Blood pressure 116/81, pulse 92, temperature 98.4 F (36.9 C), resp. rate 16, height 5' 5.5" (1.664 m), weight 72.9 kg, SpO2 96 %.  Medical Problem List and Plan: 1.  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 - appears to be complete at S2-S5/ Paraplegia  secondary to acute herniation?- nontraumatic paraplegia             -patient may  Shower if covers incision             -ELOS/Goals: 20-24 days- min Assist if possible 2.  Antithrombotics: -DVT/anticoagulation:  Pharmaceutical: Heparin 1/20- changed to Lovenox             -antiplatelet therapy: N/a 3. Pain Management: Currently using hydrocodone with flexeril prn- changed from robaxin, since wasn't working             ---Continue Neurontin 300 mg tid for neuropathy but added Cymbalta 20 mg QHS for nerve pain.   1/19- pt doesn't want an antidepressant med- explained it's for nerve pain-   1/20- pt reports pain meds make her hurt "worse"- refusing vicodin and "allergic to tramadol" and refused cymbalta- says gabapentin causes swelling- will con't tylenol prn- cannot do excedrin due to bleeding risk  1/21-  encouraged pt to continue taking keppra since I don't think it's the cause of increased pain- I think it's meds wearing off- that's more likely- explained to pt.   1/23- will try to increase Keppra to 500 mg BID for nerve pain- cont' tylenol since pt refuses everything else.  4. Mood: LCSW to follow for evaluation and support.              -antipsychotic agents:  N/A 5. Neuropsych: This patient is capable of making decisions on her own behalf. 6. Skin/Wound Care: Monitor wound for healing.  7. Fluids/Electrolytes/Nutrition: Monitor I/O. Check lytes in am.  8.  HTN: Monitor BP tid--having HYPOTENSION per therapy notes and pt- will stop Norvasc 5 mg daily and see if does ok- might need midodrine for hypotension   1/19- BP very slightly high- due to  off Norvasc- but hopefully will help orthostatic hypotension- will monitor  1/20- will restart Norvasc 2.5 mg daily since BP 156/91 this AM  1/21- BP 120/73 this AM- better with low dose Norvasc  1/22- BP 120s/but need to start Flomax- so will start Midodrine 2.5 mg TID with meals, so leaves room in BP for flomax to lower BP.   1/23- BP 110s/70s- wil d/w therapy about low BP this week 9. Hypokalemia: Preadmission labs showed K+2.9. ON Kdur 40 meq bid--check follow up labs today. Fluids d/c yesterday.  10. Mild anemia: Recheck CBC for post op labs--likely has ABLA.   11.  COPD: Respiratory status stable on Dulera bid. Albuterol prn SOB.  12. Neurogenic bladder:  Will d/c foley  In a couple of days- right now cannot tolerate Flomax due to low BP issues- will give it a few days, since having to start to deal with bowel program today- will remove Friday vs Monday-   1/19- foley removed  1/22- will start Flomax- per above about Midodrine so can tolerate Flomax.   1/23- started flomax- will take days if will help. 13. Neurogenic bowel: Has had constipation for past few months (goes once a week). She had multiple BMs (hard balls -->liquid) yesterday after mag  citrate. Will order KUB to check stool burden.   Will continue Senna S with miralax daily. Schedule suppository after supper.     - will make sure we start bowel program this evening- discussed at length with pt about bowel program.  1/22- no BM with bowel program- had accident this AM  1/23- pt says had Bowel program- not documented in chart 14. At level SCI pain- starting Cymbalta 20 mg QHS and will titrate up. Already on gabapentin but not working so far.  1/20- changed to keppra 250 mg BID for nerve pain  1/21- pt doesn't want to take- I asked her oto continue.  15. Low grade fever this AM  1/19- WBC normal- no left shift seen- get U/A and Cx- and monitor- if U/A (+), will start PO ABX.   1/20- U/A (-)  1/22- no more fevers 16. Severe depression  1/22- has refused anti-depressants so far-  1/23- discussed again- will start Paxil 10 mg QHS to try and avoid side effects.      LOS: 5 days A FACE TO FACE EVALUATION WAS PERFORMED  Mary Davila 10/12/2020, 1:23 PM

## 2020-10-13 ENCOUNTER — Inpatient Hospital Stay (HOSPITAL_COMMUNITY): Payer: Medicare HMO

## 2020-10-13 ENCOUNTER — Inpatient Hospital Stay (HOSPITAL_COMMUNITY): Payer: Medicare HMO | Admitting: Physical Therapy

## 2020-10-13 LAB — CBC
HCT: 30.4 % — ABNORMAL LOW (ref 36.0–46.0)
Hemoglobin: 10.1 g/dL — ABNORMAL LOW (ref 12.0–15.0)
MCH: 30.3 pg (ref 26.0–34.0)
MCHC: 33.2 g/dL (ref 30.0–36.0)
MCV: 91.3 fL (ref 80.0–100.0)
Platelets: 398 10*3/uL (ref 150–400)
RBC: 3.33 MIL/uL — ABNORMAL LOW (ref 3.87–5.11)
RDW: 14.5 % (ref 11.5–15.5)
WBC: 6.1 10*3/uL (ref 4.0–10.5)
nRBC: 0 % (ref 0.0–0.2)

## 2020-10-13 NOTE — Progress Notes (Addendum)
Patient ID: Mary Davila, female   DOB: October 18, 1955, 65 y.o.   MRN: [id number]  SW made efforts to contact pt dtr Mary Davila [phone]) to discuss discharge plan but voicemail full. SW will continue to make efforts.  *SW spoke with pt dtr Mary Davila to provide ELOS and will follow-up after team conference tomorrow to provide updates. Confirms between herself and her aunt- pt will have 24/7 care. She intends to follow-up with SW to inform on who will be primary contact- herself or pt sister. SW waiting on follow-up.   Update- SW primary contact will be pt sister [name]# [phone]. SW called pt sister to introduce self and discuss discharge process. She reports that pt is welcome to stay in her home in [place] while she is recovering. SW to follow-up tomorrow after team conference with updates.   Cecile Sheerer, MSW, LCSWA Office: [phone] Cell: [phone] Fax: [phone]

## 2020-10-13 NOTE — Progress Notes (Signed)
Physical Therapy Session Note  Patient Details  Name: Mary Davila MRN: [id number] Date of Birth: 08-13-1956  Today's Date: 10/13/2020 PT Individual Time: 0830-0930 PT Individual Time Calculation (min): 60 min   Short Term Goals: Week 1:  PT Short Term Goal 1 (Week 1): Pt will perform bed mobility with mod A consistently PT Short Term Goal 2 (Week 1): Pt will perform least restrictive transfer with mod A PT Short Term Goal 3 (Week 1): Pt will perform w/c mobility x 100 ft with Supervision PT Short Term Goal 4 (Week 1): Pt will initiate gait training as safe and able  Skilled Therapeutic Interventions/Progress Updates:    Patient in supine and reports having bladder pain at times now that her nerves are "waking up".  Patient performed supine heel slides, hip abduction, stabilized bridging and SAQ x 5 reps.  Patient donned shirt in long sitting supported on bed with S.  Donned pants with max A rolling to pull up using rails with min A to place pillow between legs.  Side to sit with mod A for LE's using rail and some support to stabilize trunk.  Patient scooted to EOB with min A reported using reciprocal technique as learned previously, but educated on weight shifts for improved efficiency.  Patient seated leaning back on hands.  Encouraged anterior weight shift and less UE support placing B/S table in front to perform functional tasks (doffing heavy shirt, oral care, wshing face, applying deodorant.)  Patient seated and total A to don shoes.  Sit to stand to [facility] from elevated height on bed with min A.  Patient with soiled brief so assisted in Stedy to 3:1 over toilet.  Patient voided and + for small BM, but reports did not feel when she voided.  Patient performed perineal hygiene in front with S.  Sit to stand to Stedy mod A from 3:1 and assisted with hygiene in back and donning new brief.  Assisted to bed in Stedy and sit to supine with mod A for LE's.  Pt scooted up in bed with bed in trendelenberg  and left with call light and needs in reach, bed alarm active.   Therapy Documentation Precautions:  Precautions Precautions: Back,Fall Precaution Comments: Reviewed back precautions Required Braces or Orthoses:  (no brace needed) Other Brace: no brace needed per chart/orders Restrictions Weight Bearing Restrictions: No Pain: Pain Assessment Pain Score: 7  Pain Type: Acute pain Pain Location: Groin Pain Orientation: Mid Pain Descriptors / Indicators: Sharp Pain Onset: On-going Pain Intervention(s): Repositioned   Therapy/Group: Individual Therapy  Elray Mcgregor  Sheran Lawless, PT 10/13/2020, 8:37 AM

## 2020-10-13 NOTE — Progress Notes (Signed)
Pt's bladder scan at 1am was 215 ml.At 4 am patient had incontinent episode, the brief was soaked and also smal BM . Bladder scan was done and it was 263 ml. Pt c/o for pressure pain. In and out was done and output was 350 ml. Pt tolerated well. Peri care done.Pain management in progress. Pt comfortably resting at this time.

## 2020-10-13 NOTE — Progress Notes (Signed)
Physical Therapy Session Note  Patient Details  Name: Mary Davila MRN: [id number] Date of Birth: 1956/05/23  Today's Date: 10/13/2020 PT Individual Time: 1300-1415 PT Individual Time Calculation (min): 75 min   Short Term Goals: Week 1:  PT Short Term Goal 1 (Week 1): Pt will perform bed mobility with mod A consistently PT Short Term Goal 2 (Week 1): Pt will perform least restrictive transfer with mod A PT Short Term Goal 3 (Week 1): Pt will perform w/c mobility x 100 ft with Supervision PT Short Term Goal 4 (Week 1): Pt will initiate gait training as safe and able  Skilled Therapeutic Interventions/Progress Updates:    Pt received seated in w/c in room, agreeable to PT session. No complaints of pain at rest, does have intermittent hip/groin pain bilaterally during session with mobility that resolves at rest. Manual w/c propulsion 2 x 100 ft with use of BUE at Supervision level with cues for positioning and technique. Reviewed management of w/c parts including leg rests and armrests, pt requires assist for leg rest management. Slide board transfer w/c to/from mat table x 4 reps with min A overall needed, cues for setting up safe transfer, sequencing, and head/hips relationship during transfer. Discussed importance of pressure relief schedule with patient and demonstrated several pressure relief techniques including lateral leans and w/c push-ups. Pt unable to fully clear buttocks with w/c push-ups. Pt able to set w/c up for performing L/R lateral leans while seated in w/c. Pt will require ongoing education regarding pressure relief and is currently at Supervision level for pressure relief via lateral leans while seated in w/c. Attempted sit to stand to stedy from w/c, pt requires max A x 1 and min A x 1, unable to complete full stand due to inability to extend hips and trunk, pt returned to sitting in w/c. Pt agreeable to stay seated in w/c in room at end of session, needs in reach, quick release  belt and chair alarm in place.  Therapy Documentation Precautions:  Precautions Precautions: Back,Fall Precaution Comments: Reviewed back precautions Required Braces or Orthoses:  (no brace needed) Other Brace: no brace needed per chart/orders Restrictions Weight Bearing Restrictions: No   Therapy/Group: Individual Therapy  Peter Congo, PT, DPT  10/13/2020, 4:40 PM

## 2020-10-13 NOTE — Progress Notes (Signed)
Occupational Therapy Session Note  Patient Details  Name: Mary Davila MRN: [id number] Date of Birth: Oct 04, 1955  Today's Date: 10/13/2020 OT Individual Time: 1100-1200 OT Individual Time Calculation (min): 60 min    Short Term Goals: Week 1:  OT Short Term Goal 1 (Week 1): Pt will maintain sitting balance EOB during ADLs with min A and no use of hand rails OT Short Term Goal 2 (Week 1): Pt will don LB clothing at bed level with mod A OT Short Term Goal 3 (Week 1): Pt will roll R and L with use of bed features with mod A during LB dressing  Skilled Therapeutic Interventions/Progress Updates:    Pt resting in bed upon arrival. OT intervention with focus on bed mobility, SB transfers, w/c mobility, discharge planning and DME recommendations, UB bathing/dressing at w/c level, and activity tolerance to increase independence with BADLs. Supine>sit EOB with assistance for BLE management. Pt able to push up from sidelying to sitting with CGA. Pt requires assistance palcing SB and completed SB tranfser to w/c with min A/CGA. W/c mobility with min A and min verbal cues for technique. Pt issued w/c gloves. Pt propelled to nursing station. TTB transfers demonstrated and DME recommendations discussed. Pt propelled back to room and completed UB bathing/dressing tasks with setup/supervision. RN requested pt return to bed for nursing care. SB transfer back to bed with CGA after SB placement. Sit>supine with max A. Pt remained in bed with RN present.   Therapy Documentation Precautions:  Precautions Precautions: Back,Fall Precaution Comments: Reviewed back precautions Required Braces or Orthoses:  (no brace needed) Other Brace: no brace needed per chart/orders Restrictions Weight Bearing Restrictions: No Pain: Pain Assessment Pain Scale: 0-10 Pain Score: 7  Pain Type: Acute pain Pain Location: Groin Pain Orientation: Right Pt premedicated and repositioned  Therapy/Group: Individual  Therapy  Rich Brave 10/13/2020, 12:14 PM

## 2020-10-13 NOTE — Progress Notes (Signed)
[facility] PHYSICAL MEDICINE & REHABILITATION PROGRESS NOTE   Subjective/Complaints:  Pt feels pressure in suprapubic area, pt states bladder feels full but was recently cathed, discussed that spinal cord injury may cause abnormal sensations in addition to bowel and bladder dysfunction   ROS:  Pt denies SOB, abd pain, CP, N/V/C/D, and vision changes   Objective:   No results found. Recent Labs    10/13/20 0545  WBC 6.1  HGB 10.1*  HCT 30.4*  PLT 398   No results for input(s): NA, K, CL, CO2, GLUCOSE, BUN, CREATININE, CALCIUM in the last 72 hours.  Intake/Output Summary (Last 24 hours) at 10/13/2020 0845 Last data filed at 10/13/2020 0758 Gross per 24 hour  Intake 480 ml  Output 1950 ml  Net -1470 ml        Physical Exam: Vital Signs Blood pressure 130/76, pulse 96, temperature 98.5 F (36.9 C), resp. rate 17, height 5' 5.5" (1.664 m), weight 72.9 kg, SpO2 100 %.  Constitutional: awake, laying supine in bed; still near tears, but not sobbing today, NAD HENT: conjugate gaze Cardiovascular: RRR Pulmonary: CTA B/L- no W/R/R- good air movement Abdominal: Soft, NT, ND, (+)BS  Genitourinary: foley out Musculoskeletal:     Comments: UE's 5/5 in deltoid, biceps, triceps, WE grip and finger abd LE's RLE- HF 2-/5, KE 3+/5, DF and PF 0/5 LLE- HF 1/5, KE 3+/5, DF and PF 0/5  Skin: back dressing in place - honeycomb- no drainage on dressing C/D/I- not assessed today Neurological: slowed processing- (per family, some is chronic) very depressed/flat/tearful affect    Comments: Sensation decreased at T12  To S5- dramatic reduction in sensation in T11/12 but when eyes closed, wasn't sure where I was touching her from L1-S1 nor S2-S5 B/L No hoffman's B/L No increased tone at this time B/L foot drop as above - no change Psychiatric: very depressed- feels her life is over.   Assessment/Plan: 1. Functional deficits which require 3+ hours per day of interdisciplinary therapy in a  comprehensive inpatient rehab setting.  Physiatrist is providing close team supervision and 24 hour management of active medical problems listed below.  Physiatrist and rehab team continue to assess barriers to discharge/monitor patient progress toward functional and medical goals  Care Tool:  Bathing    Body parts bathed by patient: Right arm,Left arm,Chest,Abdomen,Front perineal area,Face,Right upper leg,Left upper leg   Body parts bathed by helper: Right lower leg,Left lower leg,Buttocks     Bathing assist Assist Level: Moderate Assistance - Patient 50 - 74%     Upper Body Dressing/Undressing Upper body dressing Upper body dressing/undressing activity did not occur (including orthotics): Safety/medical concerns What is the patient wearing?: Pull over shirt    Upper body assist Assist Level: Supervision/Verbal cueing    Lower Body Dressing/Undressing Lower body dressing      What is the patient wearing?: Incontinence brief     Lower body assist Assist for lower body dressing: Total Assistance - Patient < 25%     Toileting Toileting Toileting Activity did not occur (Clothing management and hygiene only): N/A (no void or bm)  Toileting assist Assist for toileting: Total Assistance - Patient < 25%     Transfers Chair/bed transfer  Transfers assist  Chair/bed transfer activity did not occur: Safety/medical concerns  Chair/bed transfer assist level: Minimal Assistance - Patient > 75% Chair/bed transfer assistive device: Sliding board   Locomotion Ambulation   Ambulation assist   Ambulation activity did not occur: Safety/medical concerns  Walk 10 feet activity   Assist  Walk 10 feet activity did not occur: Safety/medical concerns        Walk 50 feet activity   Assist Walk 50 feet with 2 turns activity did not occur: Safety/medical concerns         Walk 150 feet activity   Assist Walk 150 feet activity did not occur: Safety/medical  concerns         Walk 10 feet on uneven surface  activity   Assist Walk 10 feet on uneven surfaces activity did not occur: Safety/medical concerns         Wheelchair     Assist Will patient use wheelchair at discharge?: Yes Type of Wheelchair: Manual    Wheelchair assist level: Supervision/Verbal cueing Max wheelchair distance: 150    Wheelchair 50 feet with 2 turns activity    Assist        Assist Level: Supervision/Verbal cueing   Wheelchair 150 feet activity     Assist      Assist Level: Supervision/Verbal cueing   Blood pressure 130/76, pulse 96, temperature 98.5 F (36.9 C), resp. rate 17, height 5' 5.5" (1.664 m), weight 72.9 kg, SpO2 100 %.  Medical Problem List and Plan: 1.  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  Antithrombotics: -DVT/anticoagulation:  Pharmaceutical: Heparin 1/20- changed to Lovenox             -antiplatelet therapy: N/a 3. Pain Management: Currently using hydrocodone with flexeril prn- changed from robaxin, since wasn't working             ---Continue Neurontin 300 mg tid for neuropathy but added Cymbalta 20 mg QHS for nerve pain.   1/19- pt doesn't want an antidepressant med- explained it's for nerve pain-   1/20- pt reports pain meds make her hurt "worse"- refusing vicodin and "allergic to tramadol" and refused cymbalta- says gabapentin causes swelling- will con't tylenol prn- cannot do excedrin due to bleeding risk  1/21- encouraged pt to continue taking keppra since I don't think it's the cause of increased pain- I think it's meds wearing off- that's more likely- explained to pt.   1/23- will try to increase Keppra to 500 mg BID for nerve pain- cont' tylenol since pt refuses everything else.   4. Mood: LCSW to follow for evaluation and support.              -antipsychotic agents:  N/A 5. Neuropsych: This patient is capable of making decisions on her own behalf. 6. Skin/Wound Care: Monitor wound for healing.  7. Fluids/Electrolytes/Nutrition: Monitor I/O. Check lytes in am.  8.  HTN: Monitor BP tid--having HYPOTENSION per therapy notes and pt- will stop Norvasc 5 mg daily and see if does ok- might need midodrine for hypotension   1/19- BP very slightly high- due to off Norvasc- but hopefully will help orthostatic hypotension- will monitor  1/20- will restart Norvasc 2.5 mg daily since BP 156/91 this AM  1/21- BP 120/73 this AM- better with low dose Norvasc  1/22- BP 120s/but need to start Flomax- so will start Midodrine 2.5 mg TID with meals, so leaves room in BP for flomax to lower  BP.   1/23- BP 110s/70s- wil d/w therapy about low BP this week 9. Hypokalemia: Preadmission labs showed K+2.9. ON Kdur 40 meq bid--check follow up labs today. Fluids d/c yesterday.  10. Mild anemia: Recheck CBC for post op labs--likely has ABLA.   11.  COPD: Respiratory status stable on Dulera bid. Albuterol prn SOB.  12. Neurogenic bladder:  Will d/c foley  In a couple of days- right now cannot tolerate Flomax due to low BP issues- will give it a few days, since having to start to deal with bowel program today- will remove Friday vs Monday-   1/19- foley removed  1/22- will start Flomax- per above about Midodrine so can tolerate Flomax.   1/23- started flomax- will take days if will help. ? Bladder spasms vs , ZPP dysesthesia 13. Neurogenic bowel: Has had constipation for past few months (goes once a week). She had multiple BMs (hard balls -->liquid) yesterday after mag citrate. Will order KUB to check stool burden.   Will continue Senna S with miralax daily. Schedule suppository after supper.     - will make sure we start bowel program this evening- discussed at length with pt about bowel  program.  1/22- no BM with bowel program- had accident this AM  1/23- pt says had Bowel program- not documented in chart 14. At level SCI pain- starting Cymbalta 20 mg QHS and will titrate up. Already on gabapentin but not working so far.  1/20- changed to keppra 250 mg BID for nerve pain  1/21- pt doesn't want to take- I asked her oto continue.  15. Low grade fever this AM  1/19- WBC normal- no left shift seen- get U/A and Cx- and monitor- if U/A (+), will start PO ABX.   1/20- U/A (-)  1/22- no more fevers 16. Severe depression  1/22- has refused anti-depressants so far-  1/23- discussed again- will start Paxil 10 mg QHS to try and avoid side effects.   Pt states this med makes her tired     LOS: 6 days A FACE TO FACE EVALUATION WAS PERFORMED  Erick Colace 10/13/2020, 8:45 AM

## 2020-10-14 ENCOUNTER — Inpatient Hospital Stay (HOSPITAL_COMMUNITY): Payer: Medicare HMO | Admitting: Physical Therapy

## 2020-10-14 ENCOUNTER — Inpatient Hospital Stay (HOSPITAL_COMMUNITY): Payer: Medicare HMO

## 2020-10-14 LAB — URINALYSIS, ROUTINE W REFLEX MICROSCOPIC
Bacteria, UA: NONE SEEN
Bilirubin Urine: NEGATIVE
Glucose, UA: NEGATIVE mg/dL
Ketones, ur: NEGATIVE mg/dL
Nitrite: NEGATIVE
Protein, ur: 100 mg/dL — AB
Specific Gravity, Urine: 1.014 (ref 1.005–1.030)
WBC, UA: >50 WBC/hpf — ABNORMAL HIGH (ref 0–5)
pH: 6 (ref 5.0–8.0)

## 2020-10-14 NOTE — Progress Notes (Signed)
[facility] PHYSICAL MEDICINE & REHABILITATION PROGRESS NOTE   Subjective/Complaints:  BMP ordered for q Monday- didn't get done- will reorder.   Pt says pain doing better- I think it's Keppra helping some.   She reports less pain overall, esp groin pain.   Said peed in depends- and feels pressure beforehand, but has voided, so not getting cathed as often- that's not per nursing notes.   Had good bowel program last night.   Discussed needs to learn to cath- she took calmly.    ROS:  Pt denies SOB, abd pain, CP, N/V/C/D, and vision changes  Objective:   No results found. Recent Labs    10/13/20 0545  WBC 6.1  HGB 10.1*  HCT 30.4*  PLT 398   No results for input(s): NA, K, CL, CO2, GLUCOSE, BUN, CREATININE, CALCIUM in the last 72 hours.  Intake/Output Summary (Last 24 hours) at 10/14/2020 0925 Last data filed at 10/14/2020 0900 Gross per 24 hour  Intake 771 ml  Output 1890 ml  Net -1119 ml        Physical Exam: Vital Signs Blood pressure 128/72, pulse 88, temperature 98.5 F (36.9 C), temperature source Oral, resp. rate 16, height 5' 5.5" (1.664 m), weight 72.9 kg, SpO2 98 %.  Constitutional: awake, alert, sitting up in bed- much calmer, NAD HENT: conjugate gaze Cardiovascular: RRR Pulmonary: CTA B/L- no W/R/R- good air movement Abdominal: Soft, NT, ND, (+)BS - hypoactive Musculoskeletal:     Comments: UE's 5/5 in deltoid, biceps, triceps, WE grip and finger abd LE's RLE- HF 2-/5, KE 3+/5, DF and PF 0/5 LLE- HF 1/5, KE 3+/5, DF and PF 0/5  Skin: back dressing in place - honeycomb- no drainage on dressing C/D/I- not assessed today Neurological: slowed processing.chronic per family; much calmer this AM- actually smiled x1.     Comments: Sensation decreased at T12  To S5- dramatic reduction in sensation in T11/12 but when eyes closed, wasn't sure where I was touching her from L1-S1 nor S2-S5 B/L No hoffman's B/L No increased tone at this time B/L foot drop  as above - no change Psychiatric: less depressed appearing this AM  Assessment/Plan: 1. Functional deficits which require 3+ hours per day of interdisciplinary therapy in a comprehensive inpatient rehab setting.  Physiatrist is providing close team supervision and 24 hour management of active medical problems listed below.  Physiatrist and rehab team continue to assess barriers to discharge/monitor patient progress toward functional and medical goals  Care Tool:  Bathing    Body parts bathed by patient: Right arm,Left arm,Chest,Abdomen,Front perineal area,Face,Right upper leg,Left upper leg   Body parts bathed by helper: Right lower leg,Left lower leg,Buttocks     Bathing assist Assist Level: Moderate Assistance - Patient 50 - 74%     Upper Body Dressing/Undressing Upper body dressing Upper body dressing/undressing activity did not occur (including orthotics): Safety/medical concerns What is the patient wearing?: Pull over shirt    Upper body assist Assist Level: Supervision/Verbal cueing    Lower Body Dressing/Undressing Lower body dressing      What is the patient wearing?: Incontinence brief,Pants     Lower body assist Assist for lower body dressing: Total Assistance - Patient < 25%     Toileting Toileting Toileting Activity did not occur Press photographer and hygiene only): N/A (no void or bm)  Toileting assist Assist for toileting: Maximal Assistance - Patient 25 - 49%     Transfers Chair/bed transfer  Transfers assist  Chair/bed transfer activity  did not occur: Safety/medical concerns  Chair/bed transfer assist level: Minimal Assistance - Patient > 75% Chair/bed transfer assistive device: Sliding board   Locomotion Ambulation   Ambulation assist   Ambulation activity did not occur: Safety/medical concerns          Walk 10 feet activity   Assist  Walk 10 feet activity did not occur: Safety/medical concerns        Walk 50 feet  activity   Assist Walk 50 feet with 2 turns activity did not occur: Safety/medical concerns         Walk 150 feet activity   Assist Walk 150 feet activity did not occur: Safety/medical concerns         Walk 10 feet on uneven surface  activity   Assist Walk 10 feet on uneven surfaces activity did not occur: Safety/medical concerns         Wheelchair     Assist Will patient use wheelchair at discharge?: Yes Type of Wheelchair: Manual    Wheelchair assist level: Supervision/Verbal cueing Max wheelchair distance: 100'    Wheelchair 50 feet with 2 turns activity    Assist        Assist Level: Supervision/Verbal cueing   Wheelchair 150 feet activity     Assist      Assist Level: Supervision/Verbal cueing   Blood pressure 128/72, pulse 88, temperature 98.5 F (36.9 C), temperature source Oral, resp. rate 16, height 5' 5.5" (1.664 m), weight 72.9 kg, SpO2 98 %.  Medical Problem List and Plan: 1.  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  Antithrombotics: -DVT/anticoagulation:  Pharmaceutical: Heparin 1/20- changed to Lovenox             -antiplatelet therapy: N/a 3. Pain Management: Currently using hydrocodone with flexeril prn- changed from robaxin, since wasn't working             ---Continue Neurontin 300 mg tid for neuropathy but added Cymbalta 20 mg QHS for nerve pain.   1/19- pt doesn't want an antidepressant med- explained it's for nerve pain-   1/20- pt reports pain meds make her hurt "worse"- refusing vicodin and "allergic to tramadol" and refused cymbalta- says gabapentin causes swelling- will con't tylenol prn- cannot do excedrin due to bleeding risk  1/21- encouraged pt to continue taking keppra since I don't  think it's the cause of increased pain- I think it's meds wearing off- that's more likely- explained to pt.   1/23- will try to increase Keppra to 500 mg BID for nerve pain- cont' tylenol since pt refuses everything else.  1/25- pain is somewhat better- con't keppra and tylenol  4. Mood: LCSW to follow for evaluation and support.              -antipsychotic agents:  N/A 5. Neuropsych: This patient is capable of making decisions on her own behalf. 6. Skin/Wound Care: Monitor wound for healing.  7. Fluids/Electrolytes/Nutrition: Monitor I/O. Check lytes in am.  8.  HTN: Monitor BP tid--having HYPOTENSION per therapy notes and pt- will stop Norvasc 5 mg daily and see if does ok- might need midodrine for hypotension   1/19- BP very slightly high- due  to off Norvasc- but hopefully will help orthostatic hypotension- will monitor  1/20- will restart Norvasc 2.5 mg daily since BP 156/91 this AM  1/21- BP 120/73 this AM- better with low dose Norvasc  1/22- BP 120s/but need to start Flomax- so will start Midodrine 2.5 mg TID with meals, so leaves room in BP for flomax to lower BP.   1/23- BP 110s/70s- wil d/w therapy about low BP this week  1/25- BP 120s/80s- con't regimen 9. Hypokalemia: Preadmission labs showed K+2.9. ON Kdur 40 meq bid--check follow up labs today. Fluids d/c yesterday.  10. Mild anemia: Recheck CBC for post op labs--likely has ABLA.   11.  COPD: Respiratory status stable on Dulera bid. Albuterol prn SOB.  12. Neurogenic bladder:  Will d/c foley  In a couple of days- right now cannot tolerate Flomax due to low BP issues- will give it a few days, since having to start to deal with bowel program today- will remove Friday vs Monday-   1/19- foley removed  1/22- will start Flomax- per above about Midodrine so can tolerate Flomax.   1/23- started flomax- will take days if will help.  1/25- pt to learn to cath- needs mirror ? Bladder spasms vs , ZPP dysesthesia 13. Neurogenic bowel: Has  had constipation for past few months (goes once a week). She had multiple BMs (hard balls -->liquid) yesterday after mag citrate. Will order KUB to check stool burden.   Will continue Senna S with miralax daily. Schedule suppository after supper.     - will make sure we start bowel program this evening- discussed at length with pt about bowel program.  1/22- no BM with bowel program- had accident this AM  1/23- pt says had Bowel program- not documented in chart  1/25- bowel program went well- con't regimenh 14. At level SCI pain- starting Cymbalta 20 mg QHS and will titrate up. Already on gabapentin but not working so far.  1/20- changed to keppra 250 mg BID for nerve pain  1/21- pt doesn't want to take- I asked her oto continue.  15. Low grade fever this AM  1/19- WBC normal- no left shift seen- get U/A and Cx- and monitor- if U/A (+), will start PO ABX.   1/20- U/A (-)  1/22- no more fevers 16. Severe depression  1/22- has refused anti-depressants so far-  1/23- discussed again- will start Paxil 10 mg QHS to try and avoid side effects.   Pt states this med makes her tired  1/25- con't Paxil if possible.      LOS: 7 days A FACE TO FACE EVALUATION WAS PERFORMED  Mary Davila 10/14/2020, 9:25 AM

## 2020-10-14 NOTE — Progress Notes (Signed)
Patient ID: Mary Davila, female   DOB: [birth year], 65 y.o.   MRN: [id number] Patient requiring I+O catheterization every 4 hours for volumes 300-350 cc. Reports onset of abdominal pain and urge to push followed by incontinence or what she perceives as urination. I+O cath after bladder scan for 350-392 cc or urine. Effective bowel program completed. Dig stim with supp after removal of small hard formed balls of stool. Medium mushy stool evacuated per rectum by patient.

## 2020-10-14 NOTE — Progress Notes (Signed)
Occupational Therapy Session Note  Patient Details  Name: Mary Davila MRN: [id number] Date of Birth: 25-Apr-1956  Today's Date: 10/14/2020 OT Individual Time: 0630-1601 OT Individual Time Calculation (min): 58 min  and Today's Date: 10/14/2020 OT Missed Time: 15 Minutes Missed Time Reason: Nursing care (I/O)   Short Term Goals: Week 1:  OT Short Term Goal 1 (Week 1): Pt will maintain sitting balance EOB during ADLs with min A and no use of hand rails OT Short Term Goal 2 (Week 1): Pt will don LB clothing at bed level with mod A OT Short Term Goal 3 (Week 1): Pt will roll R and L with use of bed features with mod A during LB dressing  Skilled Therapeutic Interventions/Progress Updates:    Pt resting in bed upon arrival.  Pt agreeable to participating in therapy but concerned that "her time" had to be used for bathing/dressing. Educated on role/purpose of OT. Supine>sit EOB with mod A for BLE management. Pt able to push up from sidelying with CGA. Pt required tot A for threading pants over B/L feet. Pt performed lateral leans in attempt to pull pants over hips. Pt became frustrated with limited success and requested to stand in [place]. Sit<>stand with Stedy at mod A utilizing BUE to pull up. Pt required tot A for pulling pants over hips. Pt performed sit<>stand X 4 in [place]. RN requested pt return to bed for I/O. On reentering room, pt commented that she was "jittery" and more tired then usual. Stedy used for transfer to w/c for UB bathing/dressing tasks with supervision. Pt remained in w/c with all needs within reach and NT present.   Therapy Documentation Precautions:  Precautions Precautions: Back,Fall Precaution Comments: Reviewed back precautions Required Braces or Orthoses:  (no brace needed) Other Brace: no brace needed per chart/orders Restrictions Weight Bearing Restrictions: No General: General OT Amount of Missed Time: 15 Minutes Pain: Pain Assessment Pain Scale: 0-10 Pain  Score: 6  Pain Location: Hip Patients Stated Pain Goal: 4 Pain Intervention(s): Meds admin ny RN during session      Therapy/Group: Individual Therapy  Rich Brave 10/14/2020, 9:34 AM

## 2020-10-14 NOTE — Patient Care Conference (Signed)
Inpatient RehabilitationTeam Conference and Plan of Care Update Date: 10/14/2020   Time: 11:09 AM    Patient Name: Mary Davila      Medical Record Number: [id number]  Date of Birth: 05-01-1956 Sex: Female         Room/Bed: 4M11C/4M11C-01 Payor Info: Payor: [employer] MEDICARE / Plan: [employer] MEDICARE HMO / Product Type: *No Product type* /    Admit Date/Time:  10/07/2020  3:51 PM  Primary Diagnosis:  Acute incomplete paraplegia [facility])  Hospital Problems: Principal Problem:   Acute incomplete paraplegia [facility]) Active Problems:   Lumbar disc herniation with myelopathy    Expected Discharge Date: Expected Discharge Date: 11/04/20  Team Members Present: Physician leading conference: Dr. Genice Rouge Care Coodinator Present: Cecile Sheerer, LCSWA;Yaser Harvill Marlyne Beards, RN, BSN, CRRN Nurse Present: Chana Bode, RN PT Present: Peter Congo, PT OT Present: Roney Mans, OT;Ardis Rowan, COTA PPS Coordinator present : Edson Snowball, [place], SLP     Current Status/Progress Goal Weekly Team Focus  Bowel/Bladder   neurogenic bowel and bladder, bowel program with dig stim daily and I+O cath q4 hours  Patient and sister will be able to manage bowel and bladder for discharge  Provide education/training to patient and sister on care   Swallow/Nutrition/ Hydration             ADL's   bathing at shower level-mod A; UB dressing-supervision; LB dressing-tot A; toileting-max A; functional transfers-SB with min A or dependent to toilet with Stedy  supervision overall  activity tolerance, transfers, LB ADLs and toileitng, education   Mobility   max A bed mobility, max A to stand to stedy, min A SB transfer, Supervision w/c mobility  Supervision overall at w/c level, mod A short distance gait goal (may need to d/c gait goal)  transfers, SCI education, w/c mobility and management   Communication             Safety/Cognition/ Behavioral Observations  Maintain safety with cues/reminders   Maintain safety through hospitalization  assess need for cues/reminders for safety with transfers and care   Pain   Pain at or below leve 6 with scheuduled and prn medications  PAin managed with scheduled medications  Assess need for prn medications with scheduled medications   Skin   Incision healed  n/a  n/a     Discharge Planning:  Pt to have 24/7 care between her dtr and sister.   Team Discussion: MD states patient needs to learn to in and out cath herself. Nursing reports she does void and when cathing she is a hard cath. Bowel program was successful but still having accidents couple of hours later. MD stays research suggests to do more dig stem.  Patient on target to meet rehab goals: Mod to max assist when standing with the steady, and it's all arm strength. She'll need a specialized W/C to go home. Showered over the weekend, min assist with the slide board. Needs lots of encouragement.   *See Care Plan and progress notes for long and short-term goals.   Revisions to Treatment Plan:  MD to order U/A, Cx, increase Keppra/Tylenol for pain.  Teaching Needs: Continue family education, medication management, transfer training, mobility training, gait training, bowel program education  Current Barriers to Discharge: Inaccessible home environment, Decreased caregiver support, Home enviroment access/layout, Neurogenic bowel and bladder, Lack of/limited family support, Medication compliance and Behavior  Possible Resolutions to Barriers: Continue current medications, provide emotional support to patient and family.     Medical Summary Current  Status: pt thinks voiding- bears down- like will void- will empty some-urine milky - will check U/A and Cx;  bowel program- but accidents within a few hours- needs more dig stim  Barriers to Discharge: Behavior;Decreased family/caregiver support;Home enviroment access/layout;Incontinence;Neurogenic Bowel & Bladder;Medical stability;Medication  compliance;Weight bearing restrictions  Barriers to Discharge Comments: going home with sister/niece; needs to learn to cath- per nursing, hard cath Possible Resolutions to Barriers/Weekly Focus: min A transfers with transfer board; stands mod A- all arms- check U/A and Cx- incr Keppra/tylenol for pain. LB ADLs Dependent- good progress- d/c 2/15   Continued Need for Acute Rehabilitation Level of Care: The patient requires daily medical management by a physician with specialized training in physical medicine and rehabilitation for the following reasons: Direction of a multidisciplinary physical rehabilitation program to maximize functional independence : Yes Medical management of patient stability for increased activity during participation in an intensive rehabilitation regime.: Yes Analysis of laboratory values and/or radiology reports with any subsequent need for medication adjustment and/or medical intervention. : Yes   I attest that I was present, lead the team conference, and concur with the assessment and plan of the team.   Tennis Must 10/14/2020, 5:28 PM

## 2020-10-14 NOTE — Progress Notes (Signed)
Patient ID: Mary Davila, female   DOB: 1956-07-31, 65 y.o.   MRN: [id number]  SW met with pt in room and [name] while in room so SW could give updates from team conference, and d/c date 2/15. SW discussed how pt will continue to require 24/7 care and assistance with personal care. [name] continues to be available to support pt and will allow pt to transition to her home as well. Sister's home set up: Front entrance- 4 steps; Back- 5 steps; 2 level home but pt can live on ground level with a tub shower to use. SW to follow-up next week with updates from team conference, and will discuss scheduling family education.   Loralee Pacas, MSW, [place] Office: [phone] Cell: [phone] Fax: [phone]

## 2020-10-14 NOTE — Progress Notes (Signed)
Physical Therapy Session Note  Patient Details  Name: Mary Davila MRN: [id number] Date of Birth: [birth year]  Today's Date: 10/14/2020 PT Individual Time: 1000-1100; 1300-1415 PT Individual Time Calculation (min): 60 min and 75 min  Short Term Goals: Week 1:  PT Short Term Goal 1 (Week 1): Pt will perform bed mobility with mod A consistently PT Short Term Goal 2 (Week 1): Pt will perform least restrictive transfer with mod A PT Short Term Goal 3 (Week 1): Pt will perform w/c mobility x 100 ft with Supervision PT Short Term Goal 4 (Week 1): Pt will initiate gait training as safe and able  Skilled Therapeutic Interventions/Progress Updates:    Session 1: Pt received seated in w/c in room, agreeable to PT session. No complaints of pain at rest, pt reports pain in hips and knee with mobility that resolves again at rest. Manual w/c propulsion 3 x 50 ft with use of BUE at Supervision level before onset of fatigue. Slide board transfer w/c to/from mat table with min A with cues for sequencing and head/hips relationship. Seated L/R lateral leans x 5 reps each direction at CGA level. Sit to stand x 3 reps to stedy with mod A needed, heavy reliance on BUE in standing. Pt unable to active glutes for hip extension in standing. Sit to supine mod A for BLE management. Attempted to perform supine bridging, unable to elicit glute contraction. Supine AAROM heel slides x 5 reps each, hip add squeeze x 5 reps. Pt fatigues quickly with all activities this session. Pt returned to sitting EOM with max A needed for BLE management and some trunk control. Slide board transfer back to w/c with min A. Pt continues to struggle with management of w/c leg rests with assist needed for donning>doffing leg rests. Pt left seated in w/c in room with needs in reach, quick release belt and chair alarm in place at end of session.  Session 2: Pt received seated in bed having just completed I/O cath with nursing. Pt very emotional  regarding news of her d/c date along with learning that plan is to d/c home to her sister's house and that she will likely be d/c home at w/c level. Provided emotional support to patient. Education regarding patient's injury regarding incomplete SCI and resulting sensory and motor deficits. Pt motivated to participate in therapy session despite feeling very emotional this PM. No complaints of pain at rest, has onset of back pain following mobility that improves at rest. Semi-reclined in bed to sitting EOB with max A for LE management and trunk control (pillow between legs for comfort with transfer). Slide board transfer at min A level throughout session. Manual w/c propulsion 2 x 100 ft with use of BUE at Supervision level. Session focus on sitting balance EOM with close Supervision to CGA at times for sitting balance. Seated ball toss progressing to overhead ball toss 3 x 30 reps to fatigue. Seated zoom ball x 3 rounds to fatigue with focus on maintaining balance with no UE support while reaching outside BOS with BUE. Pt returned to bed at end of session, mod A for BLE management. Assisted pt with donning PRAFOs at end of session. Pt left semi-reclined in bed with needs in reach, bed alarm in place at end of session.  Therapy Documentation Precautions:  Precautions Precautions: Back,Fall Precaution Comments: Reviewed back precautions Required Braces or Orthoses:  (no brace needed) Other Brace: no brace needed per chart/orders Restrictions Weight Bearing Restrictions: No   Therapy/Group: Individual  Therapy   Peter Congo, PT, DPT  10/14/2020, 11:26 AM

## 2020-10-15 ENCOUNTER — Inpatient Hospital Stay (HOSPITAL_COMMUNITY): Payer: Medicare HMO

## 2020-10-15 ENCOUNTER — Inpatient Hospital Stay (HOSPITAL_COMMUNITY): Payer: Medicare HMO | Admitting: Physical Therapy

## 2020-10-15 LAB — BASIC METABOLIC PANEL
Anion gap: 9 (ref 5–15)
BUN: 21 mg/dL (ref 8–23)
CO2: 25 mmol/L (ref 22–32)
Calcium: 9.4 mg/dL (ref 8.9–10.3)
Chloride: 106 mmol/L (ref 98–111)
Creatinine, Ser: 0.9 mg/dL (ref 0.44–1.00)
GFR, Estimated: >60 mL/min (ref >60)
Glucose, Bld: 98 mg/dL (ref 70–99)
Potassium: 4.1 mmol/L (ref 3.5–5.1)
Sodium: 140 mmol/L (ref 135–145)

## 2020-10-15 NOTE — Progress Notes (Signed)
Physical Therapy Session Note  Patient Details  Name: Mary Davila MRN: [id number] Date of Birth: 14-Apr-1956  Today's Date: 10/15/2020 PT Individual Time: 1100-1200; 1530-1600 PT Individual Time Calculation (min): 60 min and 30 min  Short Term Goals: Week 1:  PT Short Term Goal 1 (Week 1): Pt will perform bed mobility with mod A consistently PT Short Term Goal 2 (Week 1): Pt will perform least restrictive transfer with mod A PT Short Term Goal 3 (Week 1): Pt will perform w/c mobility x 100 ft with Supervision PT Short Term Goal 4 (Week 1): Pt will initiate gait training as safe and able  Skilled Therapeutic Interventions/Progress Updates:    Session 1: Pt received seated in bed, agreeable to PT session. No complaints of pain. Supine to sit with mod A for BLE management. Slide board transfer bed to w/c with min A with cues for sequencing and head/hips relationship during transfer. Set pt up with ultra light weight manual w/c to trial. Pt exhibits improved body mechanics and efficiency with propulsion of manual wheelchair vs regular lightweight wheelchair. Manual w/c propulsion 2 x 100 ft with use of BUE at Supervision level. Pt also exhibits improved ability to maneuver w/c leg rest independently. Slide board transfer to mat table with min A. Seated balance EOM performing 2# dowel rod volleyball, 2 x 30 reps to fatigue with SBA for balance. Introduced squat pivot transfer this session. Demonstrated how to safely perform transfer. Pt able to perform transfer x 4 reps with mod A needed with max cues and encouragement for safety and pt becomes anxious at times. Pt returned to bed at end of session. Sit to supine mod A for BLE management. Pt left semi-reclined in bed with needs in reach, bed alarm in place.  Session 2: Pt received seated in w/c in room, agreeable to PT session. No complaints of pain. Session focus on w/c mobility navigating through obstacle course of cones at Supervision level with mod  to max cueing for proper technique and obstacle avoidance, 3 x 50 ft. Squat pivot transfer w/c to/from mat table x 4 reps with mod A needed, max encouragement needed due to patient anxiety with this type of transfer despite performing well. Pt left seated in w/c in room with needs in reach at end of session.  Therapy Documentation Precautions:  Precautions Precautions: Back,Fall Precaution Comments: Reviewed back precautions Required Braces or Orthoses:  (no brace needed) Other Brace: no brace needed per chart/orders Restrictions Weight Bearing Restrictions: No   Therapy/Group: Individual Therapy   Peter Congo, PT, DPT  10/15/2020, 5:33 PM

## 2020-10-15 NOTE — Plan of Care (Signed)
  Problem: RH Ambulation Goal: LTG Patient will ambulate in controlled environment (PT) Description: LTG: Patient will ambulate in a controlled environment, # of feet with assistance (PT). Flowsheets (Taken 10/15/2020 1805) LTG: Pt will ambulate in controlled environ  assist needed:: (d/c goal due to slow progress) -- Note: D/c goal due to slow progress

## 2020-10-15 NOTE — Progress Notes (Signed)
[facility] PHYSICAL MEDICINE & REHABILITATION PROGRESS NOTE   Subjective/Complaints:  Per nursing, pt is hard ot cath- did void some on toilet this AM- has to push some to void- but assures me "not pushing too much".  Had results on bowel program last night and also had small BM on toilet as well in last 24 hours.   Pain better daily- slept great- back sore from bed.  Can now feel hand when touches knee- not tingling/bristle brush feeling.   ROS:  Pt denies SOB, abd pain, CP, N/V/C/D, and vision changes  Objective:   No results found. Recent Labs    10/13/20 0545  WBC 6.1  HGB 10.1*  HCT 30.4*  PLT 398   Recent Labs    10/15/20 0522  NA 140  K 4.1  CL 106  CO2 25  GLUCOSE 98  BUN 21  CREATININE 0.90  CALCIUM 9.4    Intake/Output Summary (Last 24 hours) at 10/15/2020 0845 Last data filed at 10/15/2020 0715 Gross per 24 hour  Intake 767 ml  Output 1228 ml  Net -461 ml        Physical Exam: Vital Signs Blood pressure 130/70, pulse (!) 104, temperature 98.3 F (36.8 C), resp. rate 18, height 5' 5.5" (1.664 m), weight 72.9 kg, SpO2 100 %.  Constitutional: awake, sitting up in w/c- NT in room, NAD-  HENT: conjugate gaze Cardiovascular: mildly tachycardic- regular rhythm Pulmonary: CTA B/L- no W/R/R- good air movement Abdominal: Soft, NT, ND, (+)BS  Musculoskeletal:     Comments: UE's 5/5 in deltoid, biceps, triceps, WE grip and finger abd LE's RLE- HF 2-/5, KE 3+/5, DF and PF 0/5 LLE- HF 1/5, KE 3+/5, DF and PF 0/5  Skin: back dressing in place - honeycomb- no drainage on dressing C/D/I- not assessed today Neurological: Ox2-3     Comments: Sensation decreased at T12  To S5- dramatic reduction in sensation in T11/12 but when eyes closed, wasn't sure where I was touching her from L1-S1 nor S2-S5 B/L No hoffman's B/L No increased tone at this time B/L foot drop as above - no change Psychiatric: brighter affect, smiling more  Assessment/Plan: 1.  Functional deficits which require 3+ hours per day of interdisciplinary therapy in a comprehensive inpatient rehab setting.  Physiatrist is providing close team supervision and 24 hour management of active medical problems listed below.  Physiatrist and rehab team continue to assess barriers to discharge/monitor patient progress toward functional and medical goals  Care Tool:  Bathing    Body parts bathed by patient: Right arm,Left arm,Chest,Abdomen,Front perineal area,Face,Right upper leg,Left upper leg   Body parts bathed by helper: Right lower leg,Left lower leg,Buttocks     Bathing assist Assist Level: Moderate Assistance - Patient 50 - 74%     Upper Body Dressing/Undressing Upper body dressing Upper body dressing/undressing activity did not occur (including orthotics): Safety/medical concerns What is the patient wearing?: Pull over shirt    Upper body assist Assist Level: Supervision/Verbal cueing    Lower Body Dressing/Undressing Lower body dressing      What is the patient wearing?: Incontinence brief,Pants     Lower body assist Assist for lower body dressing: Total Assistance - Patient < 25%     Toileting Toileting Toileting Activity did not occur Press photographer and hygiene only): N/A (no void or bm)  Toileting assist Assist for toileting: Maximal Assistance - Patient 25 - 49%     Transfers Chair/bed transfer  Transfers assist  Chair/bed transfer activity  did not occur: Safety/medical concerns  Chair/bed transfer assist level: Minimal Assistance - Patient > 75% Chair/bed transfer assistive device: Sliding board   Locomotion Ambulation   Ambulation assist   Ambulation activity did not occur: Safety/medical concerns          Walk 10 feet activity   Assist  Walk 10 feet activity did not occur: Safety/medical concerns        Walk 50 feet activity   Assist Walk 50 feet with 2 turns activity did not occur: Safety/medical concerns          Walk 150 feet activity   Assist Walk 150 feet activity did not occur: Safety/medical concerns         Walk 10 feet on uneven surface  activity   Assist Walk 10 feet on uneven surfaces activity did not occur: Safety/medical concerns         Wheelchair     Assist Will patient use wheelchair at discharge?: Yes Type of Wheelchair: Manual    Wheelchair assist level: Supervision/Verbal cueing Max wheelchair distance: 29'    Wheelchair 50 feet with 2 turns activity    Assist        Assist Level: Supervision/Verbal cueing   Wheelchair 150 feet activity     Assist      Assist Level: Supervision/Verbal cueing   Blood pressure 130/70, pulse (!) 104, temperature 98.3 F (36.8 C), resp. rate 18, height 5' 5.5" (1.664 m), weight 72.9 kg, SpO2 100 %.  Medical Problem List and Plan: 1.  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  Antithrombotics: -DVT/anticoagulation:  Pharmaceutical: Heparin 1/20- changed to Lovenox             -antiplatelet therapy: N/a 3. Pain Management: Currently using hydrocodone with flexeril prn- changed from robaxin, since wasn't working             ---Continue Neurontin 300 mg tid for neuropathy but added Cymbalta 20 mg QHS for nerve pain.   1/19- pt doesn't want an antidepressant med- explained it's for nerve pain-   1/20- pt reports pain meds make her hurt "worse"- refusing vicodin and "allergic to tramadol" and refused cymbalta- says gabapentin causes swelling- will con't tylenol prn- cannot do excedrin due to bleeding risk  1/21- encouraged pt to continue taking keppra since I don't think it's the cause of increased pain- I think it's meds wearing off- that's more likely- explained to pt.   1/23- will try  to increase Keppra to 500 mg BID for nerve pain- cont' tylenol since pt refuses everything else.  1/25- pain is somewhat better- con't keppra and tylenol   1/26- pain doing better daily per pt- con't regimen 4. Mood: LCSW to follow for evaluation and support.              -antipsychotic agents:  N/A 5. Neuropsych: This patient is capable of making decisions on her own behalf. 6. Skin/Wound Care: Monitor wound for healing.  7. Fluids/Electrolytes/Nutrition: Monitor I/O. Check lytes in am.  8.  HTN: Monitor BP tid--having HYPOTENSION per therapy notes and pt- will stop Norvasc 5 mg daily and see if does ok- might need midodrine for hypotension  1/19- BP very slightly high- due to off Norvasc- but hopefully will help orthostatic hypotension- will monitor  1/20- will restart Norvasc 2.5 mg daily since BP 156/91 this AM  1/21- BP 120/73 this AM- better with low dose Norvasc  1/22- BP 120s/but need to start Flomax- so will start Midodrine 2.5 mg TID with meals, so leaves room in BP for flomax to lower BP.   1/23- BP 110s/70s- wil d/w therapy about low BP this week  1/25- BP 120s/80s- con't regimen 9. Hypokalemia: Preadmission labs showed K+2.9. ON Kdur 40 meq bid--check follow up labs today. Fluids d/c yesterday.  10. Mild anemia: Recheck CBC for post op labs--likely has ABLA.   11.  COPD: Respiratory status stable on Dulera bid. Albuterol prn SOB.  12. Neurogenic bladder:  Will d/c foley  In a couple of days- right now cannot tolerate Flomax due to low BP issues- will give it a few days, since having to start to deal with bowel program today- will remove Friday vs Monday-   1/19- foley removed  1/22- will start Flomax- per above about Midodrine so can tolerate Flomax.   1/23- started flomax- will take days if will help.  1/25- pt to learn to cath- needs mirror  1/26- pt voided some- will see if able to void- voiding "some" but pushing- hard to tell if pushing too hard.  ? Bladder spasms vs , ZPP  dysesthesia 13. Neurogenic bowel: Has had constipation for past few months (goes once a week). She had multiple BMs (hard balls -->liquid) yesterday after mag citrate. Will order KUB to check stool burden.   Will continue Senna S with miralax daily. Schedule suppository after supper.     - will make sure we start bowel program this evening- discussed at length with pt about bowel program.  1/22- no BM with bowel program- had accident this AM  1/23- pt says had Bowel program- not documented in chart  1/25- bowel program went well- con't regimen  1/26- had results on toilet- working better 14. At level SCI pain- starting Cymbalta 20 mg QHS and will titrate up. Already on gabapentin but not working so far.  1/20- changed to keppra 250 mg BID for nerve pain  1/21- pt doesn't want to take- I asked her oto continue.  15. Low grade fever this AM  1/19- WBC normal- no left shift seen- get U/A and Cx- and monitor- if U/A (+), will start PO ABX.   1/20- U/A (-)  1/22- no more fevers 16. Severe depression  1/22- has refused anti-depressants so far-  1/23- discussed again- will start Paxil 10 mg QHS to try and avoid side effects.   Pt states this med makes her tired  1/25- con't Paxil if possible.   1/26- still taking per chart- brighter affect     LOS: 8 days A FACE TO FACE EVALUATION WAS PERFORMED  Mary Davila 10/15/2020, 8:45 AM

## 2020-10-15 NOTE — Progress Notes (Signed)
Occupational Therapy Session Note  Patient Details  Name: Mary Davila MRN: [id number] Date of Birth: May 08, 1956  Today's Date: 10/15/2020 OT Individual Time: 9326-7124 OT Individual Time Calculation (min): 60 min   Session 2: OT Individual Time: 5809-9833 OT Individual Time Calculation (min): 55 min    Short Term Goals: Week 1:  OT Short Term Goal 1 (Week 1): Pt will maintain sitting balance EOB during ADLs with min A and no use of hand rails OT Short Term Goal 2 (Week 1): Pt will don LB clothing at bed level with mod A OT Short Term Goal 3 (Week 1): Pt will roll R and L with use of bed features with mod A during LB dressing  Skilled Therapeutic Interventions/Progress Updates:    Session 1: Pt received supine with c/o pain in her B groin and back, unrated and requesting tylenol, which RN was alerted to and she brought in halfway through session. Pt completed supine to long sitting in bed with use of bed rails, she was educated in back precautions and need to log roll instead to reduce strain through back. Focused on adaptive LB dressing and bathing from long sitting with use of a LH sponge and reacher. Pt was able to bathe all LB thoroughly with min A using LH sponge. She was able to thread pants over feet with reacher with no assist, with bedrail removed. She rolled R and L with min A to pull up pants posteriorly with min A overall. Pt transitioned to EOB with mod A for BLE management. CGA slideboard transfer to w/c. She completed 100 ft of w/c propulsion for UE endurance/strengthening. She was returned to her room and left sitting up with chair alarm belt fastened, all needs met.   Session 2:  Pt received supine with no c/o pain, agreeable to OT session. Pt transferred to EOB with mod A for LE management. Pt then used reacher to don pants over BLE with min A. She required mod A to don pants overall. Pt completed lateral scoot/ squat pivot transfer to the w/c with min A. She propelled w/c  100 ft to the therapy gym with supervision. She used SB to transfer to the Nust[place]. She required mod-max facilitation to activate nustep, with tactile input provided as well. Pt completed 10 repetitions before fatiguing and requiring rest break, x3 trials. She returned to the w/c via SB with min A. She propelled w/c back to her room. Stedy used for toilet transfer- mod A to stand. Max A for clothing management. Pt was able to void some urine continently. Pt returned to her w/c and was left sitting up with all needs met.    Therapy Documentation Precautions:  Precautions Precautions: Back,Fall Precaution Comments: Reviewed back precautions Required Braces or Orthoses:  (no brace needed) Other Brace: no brace needed per chart/orders Restrictions Weight Bearing Restrictions: No   Therapy/Group: Individual Therapy  Curtis Sites 10/15/2020, 6:43 AM

## 2020-10-15 NOTE — Progress Notes (Signed)
Physical Therapy Weekly Progress Note  Patient Details  Name: Mary Davila MRN: [id number] Date of Birth: [birth year]  Beginning of progress report period: October 08, 2020 End of progress report period: October 15, 2020  Patient has met 3 of 4 short term goals.  Pt is overall making good progress towards therapy goals. She is currently mod A for bed mobility, min A for slide board transfer, mod A for squat pivot transfers, min/mod A for sit to stand with stedy, and is at Supervision level for w/c mobility. Introduced pressure relief techniques via lateral leans with ongoing education needed. Pt is very motivated and exhibits good participation in all therapy sessions.  Patient continues to demonstrate the following deficits muscle weakness, muscle joint tightness and muscle paralysis, decreased cardiorespiratoy endurance, abnormal tone and unbalanced muscle activation and decreased sitting balance, decreased standing balance, decreased postural control and decreased balance strategies and therefore will continue to benefit from skilled PT intervention to increase functional independence with mobility.  Patient not progressing toward long term goals.  See goal revision..  Plan of care revisions: Discharged gait goal as it is not appropriate to work towards at this time. Pt making good progress towards all othera therapy goals.  PT Short Term Goals Week 1:  PT Short Term Goal 1 (Week 1): Pt will perform bed mobility with mod A consistently PT Short Term Goal 1 - Progress (Week 1): Met PT Short Term Goal 2 (Week 1): Pt will perform least restrictive transfer with mod A PT Short Term Goal 2 - Progress (Week 1): Met PT Short Term Goal 3 (Week 1): Pt will perform w/c mobility x 100 ft with Supervision PT Short Term Goal 3 - Progress (Week 1): Met PT Short Term Goal 4 (Week 1): Pt will initiate gait training as safe and able PT Short Term Goal 4 - Progress (Week 1): Not met Week 2:  PT Short Term  Goal 1 (Week 2): Pt will perform bed mobility with min A consistently PT Short Term Goal 2 (Week 2): Pt will perform least restrictive transfer with min A consistently PT Short Term Goal 3 (Week 2): Pt will recall and be able to perform 2 pressure relief techniques    Therapy Documentation Precautions:  Precautions Precautions: Back,Fall Precaution Comments: Reviewed back precautions Required Braces or Orthoses:  (no brace needed) Other Brace: no brace needed per chart/orders Restrictions Weight Bearing Restrictions: No   Therapy/Group: Individual Therapy   Excell Seltzer, PT, DPT  10/15/2020, 7:49 AM

## 2020-10-16 ENCOUNTER — Inpatient Hospital Stay (HOSPITAL_COMMUNITY): Payer: Medicare HMO | Admitting: *Deleted

## 2020-10-16 ENCOUNTER — Inpatient Hospital Stay (HOSPITAL_COMMUNITY): Payer: Medicare HMO | Admitting: Physical Therapy

## 2020-10-16 ENCOUNTER — Inpatient Hospital Stay (HOSPITAL_COMMUNITY): Payer: Medicare HMO

## 2020-10-16 LAB — URINE CULTURE: Culture: 30000 — AB

## 2020-10-16 NOTE — Progress Notes (Signed)
Patient ID: Mary Davila, female   DOB: 04/25/1956, 65 y.o.   MRN: [id number]   Per PT, pt will need ramp at discharge if going to her sister's home since pt will d/c at a wheelchair level.  SW spoke with pt sister [place] to discuss above. SW discussed different community resources that she can try as well. SW will provide a list of these resources to patient.   *SW discussed above with pt and provided resource list.   Cecile Sheerer, MSW, LCSWA Office: [phone] Cell: [phone] Fax: [phone]

## 2020-10-16 NOTE — Progress Notes (Signed)
Occupational Therapy Session Note  Patient Details  Name: Nitya Cauthon MRN: [id number] Date of Birth: 1956/08/18  Today's Date: 10/16/2020 OT Individual Time: 0160-1093 OT Individual Time Calculation (min): 75 min    Short Term Goals: Week 2:  OT Short Term Goal 1 (Week 2): Pt will perform drop arm BSC tranfser using SB with min a OT Short Term Goal 2 (Week 2): Pt will don pants at bed level with min A using AE PRN OT Short Term Goal 3 (Week 2): Pt will perform LB bahting at bed level using AE PRN OT Short Term Goal 4 (Week 2): Pt will perform toileting tasks seated on BSC with mod A for clothing management  Skilled Therapeutic Interventions/Progress Updates:    Pt resting in bed upon arrival. OT intervention with focus on bed mobility, sitting balance, dressing EOB and in w/c, standing balance in [place], w/c mgmt, AE use, and activity tolerance to increase independence with BADLs. Supine>sit EOB with mod A for LE management. Sitting balance with supervision. Pt threaded pants over BLE using reacher and required assistance pulling over feet (gripper socks). Sit<>stand in [place] with min A, primarily relying on BUE strength. Pt dependent for pulling over hips while standing in [place]. Pt completed UB bathing/dressing at w/c level with supervision. Pt used sock aid for donning socks. Shoe funnel demonstrated for donning shoes. Pt able to place and removed B/L leg rests from w/c. Pt remained in w/c with all needs within reach and belt alarm activated.   Therapy Documentation Precautions:  Precautions Precautions: Back,Fall Precaution Comments: Reviewed back precautions Required Braces or Orthoses:  (no brace needed) Other Brace: no brace needed per chart/orders Restrictions Weight Bearing Restrictions: (P) No     Therapy/Group: Individual Therapy  Rich Brave 10/16/2020, 10:47 AM

## 2020-10-16 NOTE — Progress Notes (Signed)
Patient was bladder scanned and then seated on toilet after stating "an urge to go", per patient request left to sit for a couple of minutes. Patient was able to have a small bowel movement and urinate, scanned again results were 0 mls.

## 2020-10-16 NOTE — Evaluation (Signed)
Recreational Therapy Assessment and Plan  Patient Details  Name: Mary Davila MRN: [id number] Date of Birth: [birth year] Today's Date: 10/16/2020  Rehab Potential:  Good ELOS:   discharge 2/15  Assessment   Hospital Problem: Principal Problem:   Acute incomplete paraplegia [facility]) Active Problems:   Lumbar disc herniation with myelopathy   Past Medical History:      Past Medical History:  Diagnosis Date  . Arthritis   . Asthma   . Constipation   . Hypertension   . Nocturia    Past Surgical History:       Past Surgical History:  Procedure Laterality Date  . ABDOMINAL HYSTERECTOMY    . BACK SURGERY    . EYE SURGERY    . JOINT REPLACEMENT Bilateral    bilateral hip replaced    Assessment & Plan Clinical Impression:  Mary Davila is a 65 year old female with history of HTN, OA/DDD who underwent L1-L2 microdisectomy at outpatient surgery on 10/03/19 but developed significant weakness BLE post op with numbness/tingling RLE. She was transferred to [facility] for work up. MRI spine showed large central disc extrusion with mass effect on spinal cord, now with severe left L4/L5 foraminal stenosis and incidental note of massively distended urinary bladder. Patient with dense paraplegia and symptoms felt to be due to recurrent/residual migration of disc at L1 with edema. She was taken back to OR for L1 laminectomy with complete facetectomy for decompression of thecal sac on 01/14 by Dr. Kathyrn Sheriff.   Post op continues to be limited by BLE weakness with foot drop, lacks sensation BLE, dizziness with standing attempts in [place] as well as groin pain with neuropathy. Foley in place and no documented BM. Therapy has been ongoing and work on balance at [place]. She continues to have impairment in mobility and ADLs. CIR recommended due to functional decline.Patient transferred to CIR on 10/07/2020 and now referred by treatment team for TR services   Pt presents with decreased  activity tolerance, decreased functional mobility, muscle paralysis, decreased balance Limiting pt's independence with leisure/community pursuits.  Met with pt today to discuss leisure interests, activity analysis/modificaitons, coping and relaxation strategies.  Also provided activities for use in the room.  Pt appreciative of this visit.  Plan  Min 2 sessions >20 minutes per week during LOS Recommendations for other services: None   Discharge Criteria: Patient will be discharged from TR if patient refuses treatment 3 consecutive times without medical reason.  If treatment goals not met, if there is a change in medical status, if patient makes no progress towards goals or if patient is discharged from hospital.  The above assessment, treatment plan, treatment alternatives and goals were discussed and mutually agreed upon: by patient  [place] 10/16/2020, 4:00 PM

## 2020-10-16 NOTE — Progress Notes (Signed)
Physical Therapy Session Note  Patient Details  Name: Mary Davila MRN: [id number] Date of Birth: 11/13/1955  Today's Date: 10/16/2020 PT Individual Time: 1427-1530 and 1005-1100  PT Individual Time Calculation (min): 63 min and 55 min  Short Term Goals: Week 2:  PT Short Term Goal 1 (Week 2): Pt will perform bed mobility with min A consistently PT Short Term Goal 2 (Week 2): Pt will perform least restrictive transfer with min A consistently PT Short Term Goal 3 (Week 2): Pt will recall and be able to perform 2 pressure relief techniques  Skilled Therapeutic Interventions/Progress Updates: Pt presented at toilet with NT present completing toileting. Pt performed stand with Stedy with PTA and NT completing LB clothing management, then transported back to w/c. Pt then propelled to ortho gym and participated in standing frame activity for ~20 min. Pt unable to engage glutes but was able to performed small range TKE 2 x 10 with pt initially requiring AA then able to perform more independently. Pt denies any s/s of hypotension during stand. Pt also participated in abdominal activation and taking a breath while maintaining x 10 in standing.  Pt was able to maintain erect posture with minimal cues. Upon returning to w/c pt was able to donn leg rests with supervision and propel back to room at supervision level. Pt remained in w/c at end of session with belt alarm on, call bell within reach and needs met.    Tx2: Pt presented in w/c agreeable to therapy. Pt denies pain but during session c/o increased "soreness" in back but no intervention required. Pt propelled partial distance to rehab gym with PTA asissisting in remaining distance for energy conservation. Pt was supervision for removing leg rests and was able to verbalize method for squat pivot to mat with pt also expressing anxiety regarding transfer. PTA provided emotional support and educated that pt was able to perform quiet successfully previous day.  Pt required min cues for set up and performed squat pivot transfer to mat with modA, Pt then participated in sitting balance activities including ball toss, bouncing ball pass, ball taps with 3lb dowel, and lateral taps with ball. Each activity performed to fatigue ~30 reps ea. Pt then performed block practice squat pivot transfers with consistent modA but with improved set up not requiring cues. Pt then indicated pressure like need for urination. Performed squat pivot transfer back to w/c in same manner as prior and pt transported back to room for time management. Performed Stedy transfer to toilet with pt requiring minA (due to heavy use of BUE) to stand. Pt left at toilet at end of session with pt verbalizing use of pull string once ready to return to w/c and Jess, NT aware of pt's disposition.        Therapy Documentation Precautions:  Precautions Precautions: Back,Fall Precaution Comments: Reviewed back precautions Required Braces or Orthoses:  (no brace needed) Other Brace: no brace needed per chart/orders Restrictions Weight Bearing Restrictions: No General: PT Amount of Missed Time (min): 12 Minutes PT Missed Treatment Reason: Toileting Vital Signs: Therapy Vitals Temp: 98.4 F (36.9 C) Temp Source: Oral Pulse Rate: 79 Resp: 18 BP: 128/78 Patient Position (if appropriate): Sitting Oxygen Therapy SpO2: 100 % Other Treatments:      Therapy/Group: Individual Therapy  Jannet Calip  Rahma Meller, PTA  10/16/2020, 4:25 PM

## 2020-10-16 NOTE — Progress Notes (Signed)
Bowel program complete with results. Medium sized mushy stool. Patient tolerated well. Call bell within reach.

## 2020-10-16 NOTE — Progress Notes (Signed)
Occupational Therapy Weekly Progress Note  Patient Details  Name: Mary Davila MRN: [id number] Date of Birth: 09/16/1956  Beginning of progress report period: October 08, 2020 End of progress report period: October 16, 2020   Patient has met 3 of 3 short term goals. Pt making steady progress with BADLs and functional SB transfers. Bathing at shower level and bed level with mod A. LB dressing with max A seated EOB (standing with Stedy) and bed level. UB bathing/dressing w/c level at sink with supervision. SB transfers with min A.Pt dependent for toileting on BSC.  Patient continues to demonstrate the following deficits: muscle weakness, decreased cardiorespiratoy endurance, impaired timing and sequencing, unbalanced muscle activation and decreased coordination and decreased sitting balance, decreased standing balance and decreased postural control and therefore will continue to benefit from skilled OT intervention to enhance overall performance with BADL, iADL and Reduce care partner burden.  Patient progressing toward long term goals..  Continue plan of care.  OT Short Term Goals Week 1:  OT Short Term Goal 1 (Week 1): Pt will maintain sitting balance EOB during ADLs with min A and no use of hand rails OT Short Term Goal 1 - Progress (Week 1): Met OT Short Term Goal 2 (Week 1): Pt will don LB clothing at bed level with mod A OT Short Term Goal 2 - Progress (Week 1): Met OT Short Term Goal 3 (Week 1): Pt will roll R and L with use of bed features with mod A during LB dressing OT Short Term Goal 3 - Progress (Week 1): Met Week 2:  OT Short Term Goal 1 (Week 2): Pt will perform drop arm BSC tranfser using SB with min a OT Short Term Goal 2 (Week 2): Pt will don pants at bed level with min A using AE PRN OT Short Term Goal 3 (Week 2): Pt will perform LB bahting at bed level using AE PRN OT Short Term Goal 4 (Week 2): Pt will perform toileting tasks seated on BSC with mod A for clothing  management   Leroy Libman 10/16/2020, 6:34 AM

## 2020-10-16 NOTE — Progress Notes (Signed)
[facility] PHYSICAL MEDICINE & REHABILITATION PROGRESS NOTE   Subjective/Complaints:  Mary Davila reports needs to go pee- asap- NT getting her on  Steady to get to bathroom Pain doing a little better daily per Mary Davila.   Had medium BM with bowel program and also small BM on TOILET_ which is great overnight.    ROS:  Mary Davila denies SOB, abd pain, CP, N/V/C/D, and vision changes  Objective:   No results found. No results for input(s): WBC, HGB, HCT, PLT in the last 72 hours. Recent Labs    10/15/20 0522  NA 140  K 4.1  CL 106  CO2 25  GLUCOSE 98  BUN 21  CREATININE 0.90  CALCIUM 9.4    Intake/Output Summary (Last 24 hours) at 10/16/2020 0902 Last data filed at 10/16/2020 0802 Gross per 24 hour  Intake 476 ml  Output 1463 ml  Net -987 ml        Physical Exam: Vital Signs Blood pressure 137/81, pulse 76, temperature 98.3 F (36.8 C), resp. rate 16, height 5' 5.5" (1.664 m), weight 72.9 kg, SpO2 100 %.  Constitutional: awake, alert, calmer, on steady with NT, NAD HENT: conjugate gaze Cardiovascular: RRR Pulmonary: CTA B/L- no W/R/R- good air movement Abdominal: Soft, NT, ND, (+)BS  Musculoskeletal:     Comments: UE's 5/5 in deltoid, biceps, triceps, WE grip and finger abd LE's RLE- HF 2-/5, KE 3+/5, DF and PF 0/5 LLE- HF 1/5, KE 3+/5, DF and PF 0/5  Skin: back dressing in place - honeycomb- no drainage on dressing C/D/I- not assessed today Neurological: Ox2-3     Comments: Sensation decreased at T12  To S5- dramatic reduction in sensation in T11/12 but when eyes closed, wasn't sure where I was touching her from L1-S1 nor S2-S5 B/L No hoffman's B/L No increased tone at this time B/L foot drop as above - no change Psychiatric: calmer  Assessment/Plan: 1. Functional deficits which require 3+ hours per day of interdisciplinary therapy in a comprehensive inpatient rehab setting.  Physiatrist is providing close team supervision and 24 hour management of active medical problems  listed below.  Physiatrist and rehab team continue to assess barriers to discharge/monitor patient progress toward functional and medical goals  Care Tool:  Bathing    Body parts bathed by patient: Right arm,Left arm,Chest,Abdomen,Front perineal area,Face,Right upper leg,Left upper leg   Body parts bathed by helper: Right lower leg,Left lower leg,Buttocks     Bathing assist Assist Level: Moderate Assistance - Patient 50 - 74%     Upper Body Dressing/Undressing Upper body dressing Upper body dressing/undressing activity did not occur (including orthotics): Safety/medical concerns What is the patient wearing?: Pull over shirt    Upper body assist Assist Level: Supervision/Verbal cueing    Lower Body Dressing/Undressing Lower body dressing      What is the patient wearing?: Incontinence brief,Pants     Lower body assist Assist for lower body dressing: Total Assistance - Patient < 25%     Toileting Toileting Toileting Activity did not occur Press photographer and hygiene only): N/A (no void or bm)  Toileting assist Assist for toileting: Maximal Assistance - Patient 25 - 49%     Transfers Chair/bed transfer  Transfers assist  Chair/bed transfer activity did not occur: Safety/medical concerns  Chair/bed transfer assist level: Moderate Assistance - Patient 50 - 74% Chair/bed transfer assistive device:  (squat pivot)   Locomotion Ambulation   Ambulation assist   Ambulation activity did not occur: Safety/medical concerns  Walk 10 feet activity   Assist  Walk 10 feet activity did not occur: Safety/medical concerns        Walk 50 feet activity   Assist Walk 50 feet with 2 turns activity did not occur: Safety/medical concerns         Walk 150 feet activity   Assist Walk 150 feet activity did not occur: Safety/medical concerns         Walk 10 feet on uneven surface  activity   Assist Walk 10 feet on uneven surfaces activity did  not occur: Safety/medical concerns         Wheelchair     Assist Will patient use wheelchair at discharge?: Yes Type of Wheelchair: Manual    Wheelchair assist level: Supervision/Verbal cueing Max wheelchair distance: 100'    Wheelchair 50 feet with 2 turns activity    Assist        Assist Level: Supervision/Verbal cueing   Wheelchair 150 feet activity     Assist      Assist Level: Supervision/Verbal cueing   Blood pressure 137/81, pulse 76, temperature 98.3 F (36.8 C), resp. rate 16, height 5' 5.5" (1.664 m), weight 72.9 kg, SpO2 100 %.  Medical Problem List and Plan: 1.  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  Antithrombotics: -DVT/anticoagulation:  Pharmaceutical: Heparin 1/20- changed to Lovenox             -antiplatelet therapy: N/a 3. Pain Management: Currently using hydrocodone with flexeril prn- changed from robaxin, since wasn't working             ---Continue Neurontin 300 mg tid for neuropathy but added Cymbalta 20 mg QHS for nerve pain.   1/19- Mary Davila doesn't want an antidepressant med- explained it's for nerve pain-   1/20- Mary Davila reports pain meds make her hurt "worse"- refusing vicodin and "allergic to tramadol" and refused cymbalta- says gabapentin causes swelling- will con't tylenol prn- cannot do excedrin due to bleeding risk  1/21- encouraged Mary Davila to continue taking keppra since I don't think it's the cause of increased pain- I think it's meds wearing off- that's more likely- explained to Mary Davila.   1/23- will try to increase Keppra to 500 mg BID for nerve pain- cont' tylenol since Mary Davila refuses everything else.   1/27- Mary Davila reports pain a little better daily- con't regimen 4. Mood: LCSW to follow for evaluation and support.               -antipsychotic agents:  N/A 5. Neuropsych: This patient is capable of making decisions on her own behalf. 6. Skin/Wound Care: Monitor wound for healing.  7. Fluids/Electrolytes/Nutrition: Monitor I/O. Check lytes in am.  8.  HTN: Monitor BP tid--having HYPOTENSION per therapy notes and Mary Davila- will stop Norvasc 5 mg daily and see if does ok- might need midodrine for hypotension   1/19- BP very slightly high- due to off Norvasc- but hopefully will help orthostatic hypotension- will monitor  1/20- will restart Norvasc 2.5 mg daily since BP 156/91 this AM  1/21- BP 120/73 this AM- better with low dose Norvasc  1/22- BP 120s/but need to start Flomax- so will start Midodrine 2.5 mg TID  with meals, so leaves room in BP for flomax to lower BP.   1/23- BP 110s/70s- wil d/w therapy about low BP this week  1/25- BP 120s/80s- con't regimen  1/27- BP 137/81- stable- con't regimen 9. Hypokalemia: Preadmission labs showed K+2.9. ON Kdur 40 meq bid--check follow up labs today. Fluids d/c yesterday.   1/27- K+ 4.1- doing well- con't regimen 10. Mild anemia: Recheck CBC for post op labs--likely has ABLA.   11.  COPD: Respiratory status stable on Dulera bid. Albuterol prn SOB.  12. Neurogenic bladder:  Will d/c foley  In a couple of days- right now cannot tolerate Flomax due to low BP issues- will give it a few days, since having to start to deal with bowel program today- will remove Friday vs Monday-   1/19- foley removed  1/22- will start Flomax- per above about Midodrine so can tolerate Flomax.   1/23- started flomax- will take days if will help.  1/25- Mary Davila to learn to cath- needs mirror  1/26- Mary Davila voided some- will see if able to void- voiding "some" but pushing- hard to tell if pushing too hard.   1/27- Mary Davila voiding some on toilet- as well as having small BMs- con't Flomax ? Bladder spasms vs , ZPP dysesthesia 13. Neurogenic bowel: Has had constipation for past few months (goes once a week). She had multiple  BMs (hard balls -->liquid) yesterday after mag citrate. Will order KUB to check stool burden.   Will continue Senna S with miralax daily. Schedule suppository after supper.     - will make sure we start bowel program this evening- discussed at length with Mary Davila about bowel program.  1/22- no BM with bowel program- had accident this AM  1/23- Mary Davila says had Bowel program- not documented in chart  1/25- bowel program went well- con't regimen  1/26- had results on toilet- working better  1/27- results on bowel program last night AND results on toilet overnight.  14. At level SCI pain- starting Cymbalta 20 mg QHS and will titrate up. Already on gabapentin but not working so far.  1/20- changed to keppra 250 mg BID for nerve pain  1/21- Mary Davila doesn't want to take- I asked her oto continue.  15. Low grade fever this AM  1/19- WBC normal- no left shift seen- get U/A and Cx- and monitor- if U/A (+), will start PO ABX.   1/20- U/A (-)  1/22- no more fevers  1/27- rechecked U/A on 1/25- (+) but had 30k Ecoli- sensitive to everything- Sx's improving- wait on starting ABX since not 100k EColi.  16. Severe depression  1/22- has refused anti-depressants so far-  1/23- discussed again- will start Paxil 10 mg QHS to try and avoid side effects.   Mary Davila states this med makes her tired  1/25- con't Paxil if possible.   1/26- still taking per chart- brighter affect     LOS: 9 days A FACE TO FACE EVALUATION WAS PERFORMED  Mary Davila 10/16/2020, 9:02 AM

## 2020-10-17 NOTE — Progress Notes (Signed)
Occupational Therapy Session Note  Patient Details  Name: Mary Davila MRN: [id number] Date of Birth: 01-12-1956  Today's Date: 10/17/2020 OT Individual Time: 9432-7614 OT Individual Time Calculation (min): 70 min    Short Term Goals: Week 2:  OT Short Term Goal 1 (Week 2): Pt will perform drop arm BSC tranfser using SB with min a OT Short Term Goal 2 (Week 2): Pt will don pants at bed level with min A using AE PRN OT Short Term Goal 3 (Week 2): Pt will perform LB bahting at bed level using AE PRN OT Short Term Goal 4 (Week 2): Pt will perform toileting tasks seated on BSC with mod A for clothing management  Skilled Therapeutic Interventions/Progress Updates:    Pt on toilet upon arrival with NT present. NT assisted pt with hygiene. Pt tranfserred to TTB in shower with Stedy. Pt completed bathing at shower level with mod A, requiring assistance with B/L lower legs/feet and buttocks. Pt completed all other tasks without assitance. Supervision for sitting balance. No LOB noted. Pt transferred back to w/c with Stedy. Pt required assistance threading BLE into pants this morning. Sit<>stand in [place] with min A and pt using primarily BUE to pull up. Dependent for pulling pants over hips. Pt completed UB bathing/dressing tasks with supervision. Pt remained in w/c with all needs within reach and belt alarm activated.  Therapy Documentation Precautions:  Precautions Precautions: Back,Fall Precaution Comments: Reviewed back precautions Required Braces or Orthoses:  (no brace needed) Other Brace: no brace needed per chart/orders Restrictions Weight Bearing Restrictions: No   Pain:  Pt denies pain     Therapy/Group: Individual Therapy  Rich Brave 10/17/2020, 9:27 AM

## 2020-10-17 NOTE — Progress Notes (Signed)
Patient had large bowel movement before shift change. Wanted to stay in chair for a while longer. When patient was moved back in bed the bowel program was started with no results. When completing the bowel program only a small, soft pellet of feces was expelled. Patient left resting comfortably with call bell within reach.

## 2020-10-17 NOTE — Progress Notes (Signed)
Physical Therapy Session Note  Patient Details  Name: Mary Davila MRN: [id number] Date of Birth: December 08, 1955  Today's Date: 10/17/2020 PT Individual Time: 5726-2035 and 5974-1638 PT Individual Time Calculation (min): 56 min and 64 min  Short Term Goals: Week 2:  PT Short Term Goal 1 (Week 2): Pt will perform bed mobility with min A consistently PT Short Term Goal 2 (Week 2): Pt will perform least restrictive transfer with min A consistently PT Short Term Goal 3 (Week 2): Pt will recall and be able to perform 2 pressure relief techniques  Skilled Therapeutic Interventions/Progress Updates: Tx1: Pt presented in w/c agreeable to therapy. Lattie Haw, RT present throughout session. Pt donned socks and shoes with modA and use of ADs. Pt propelled to elevators with supervision and without rest breaks! Pt required intermittent cues to increase stroke length for more efficient propulsion. Participated in Cybex Kinetron 90cm/sec 10 cycles x 2 with pt able to actively push with RLE and required AA for LLE. Pt then transported to mat and performed squat pivot to mat with modA, pt was supervision with leg rests. Pt participated in seated activities with music playing. Pt was able to move BUE extremities with music intermittently using dowel and performed shoulder flexion, horiz abd/add, swaying, and lateral leans with trunk. Pt also performed LAQ intermittently within available range. Rest breaks provided as needed but pt participated in activity ~34mn. Performed squat pivot transfer back to w/c with modA and transported back to room for time management. Pt left in recliner at end of session and left with belt alarm on, call bell within reach and needs met.   Tx2: Pt presented in w/c agreeable to therapy. Pt denies pain at start of session. Pt propelled to ortho gym supervision and was able to set up next to mat and remove leg rests with supervision. Performed squat pivot transfer to mat with modA and performed sit to  sidelying with modA primarily for BLE management. Pt participated in gravity eliminated activities  Including LLE hip flexion x10, knee flexion with pt having trace activation on first 2 curls with PTA performing PROM on last 3. Pt then rolled to L with modA and performed AROM hip flexion x 10 with RLE and AROM in limited range knee flexion with PTA providing AA to increase knee flexion range. Once powder board removed pt rolled to supine and was able to push up to long sit with increased time/effort. Pt then provided with leg reacher and used it to move BLE towards EOM (with only feet dangling), pt was then able to scoot to EOM with CGA and increased effort. Performed squat pivot to w/c with modA with pt feeling frustrated due to difficulty in performing transfer. Provided emotional support and set realistic expectations that things will not be perfect initially with pt verbalizing understanding. PTA then had pt demonstrate any pressure relief techniques that has been practicing, pt was able to perform lateral leans and push ups in w/c. PTA also discussed pressure relieving techniques in bed as pt able to perform long sit in bed. Pt transported back to room and requesting to use bathroom. [place] and transferred to toilet. Pt left at toilet with pull cord for call light within reach and nsg notified of pt's disposition.      Therapy Documentation Precautions:  Precautions Precautions: Back,Fall Precaution Comments: Reviewed back precautions Required Braces or Orthoses:  (no brace needed) Other Brace: no brace needed per chart/orders Restrictions Weight Bearing Restrictions: No  Therapy/Group: Individual Therapy  Trestan Vahle  Ellie Bryand, PTA  10/17/2020, 4:21 PM

## 2020-10-17 NOTE — Progress Notes (Signed)
[facility] PHYSICAL MEDICINE & REHABILITATION PROGRESS NOTE   Subjective/Complaints:  Pt reports had 2 BMs on toilet in last 24 hours- felt the urge/pressure and went to toilet- able to go ON toilet.  Was  2 very large BMs  Also peeing on toilet as well hasn't needed cathing for 24 hours! Which is great- pt's mood MUCH better per pt- feels like she's getting somewhere.   ROS:  Pt denies SOB, abd pain, CP, N/V/C/D, and vision changes   Objective:   No results found. No results for input(s): WBC, HGB, HCT, PLT in the last 72 hours. Recent Labs    10/15/20 0522  NA 140  K 4.1  CL 106  CO2 25  GLUCOSE 98  BUN 21  CREATININE 0.90  CALCIUM 9.4    Intake/Output Summary (Last 24 hours) at 10/17/2020 0854 Last data filed at 10/17/2020 0746 Gross per 24 hour  Intake 840 ml  Output -  Net 840 ml        Physical Exam: Vital Signs Blood pressure 123/80, pulse 86, temperature 98.9 F (37.2 C), temperature source Oral, resp. rate 17, height 5' 5.5" (1.664 m), weight 72.9 kg, SpO2 100 %.  Constitutional: awake, alert, brighter affect, sitting up in bedside w/c, NAD HENT: conjugate gaze Cardiovascular: RRR Pulmonary: CTA B/L- no W/R/R- good air movement\ Abdominal: Soft, NT, ND, (+)BS  Musculoskeletal:     Comments: UE's 5/5 in deltoid, biceps, triceps, WE grip and finger abd LE's RLE- HF 2-/5, KE 3+/5, DF and PF 0/5 LLE- HF 1/5, KE 3+/5, DF and PF 0/5  Skin: back dressing in place - honeycomb- no drainage on dressing C/D/I- not assessed today Neurological: Ox3- but still slowed processing    Comments: Sensation decreased at T12  To S5- dramatic reduction in sensation in T11/12 but when eyes closed, wasn't sure where I was touching her from L1-S1 nor S2-S5 B/L No hoffman's B/L No increased tone at this time B/L foot drop as above - no change Psychiatric: calmer and much brighter  Assessment/Plan: 1. Functional deficits which require 3+ hours per day of  interdisciplinary therapy in a comprehensive inpatient rehab setting.  Physiatrist is providing close team supervision and 24 hour management of active medical problems listed below.  Physiatrist and rehab team continue to assess barriers to discharge/monitor patient progress toward functional and medical goals  Care Tool:  Bathing    Body parts bathed by patient: Right arm,Left arm,Chest,Abdomen,Front perineal area,Face,Right upper leg,Left upper leg   Body parts bathed by helper: Right lower leg,Left lower leg,Buttocks Body parts n/a: Right lower leg,Left lower leg   Bathing assist Assist Level: Moderate Assistance - Patient 50 - 74%     Upper Body Dressing/Undressing Upper body dressing Upper body dressing/undressing activity did not occur (including orthotics): Safety/medical concerns What is the patient wearing?: Pull over shirt    Upper body assist Assist Level: Supervision/Verbal cueing    Lower Body Dressing/Undressing Lower body dressing      What is the patient wearing?: Pants     Lower body assist Assist for lower body dressing: Maximal Assistance - Patient 25 - 49%     Toileting Toileting Toileting Activity did not occur (Clothing management and hygiene only): N/A (no void or bm)  Toileting assist Assist for toileting: Maximal Assistance - Patient 25 - 49%     Transfers Chair/bed transfer  Transfers assist  Chair/bed transfer activity did not occur: Safety/medical concerns  Chair/bed transfer assist level: Moderate Assistance - Patient  50 - 74% Chair/bed transfer assistive device:  (squat pivot)   Locomotion Ambulation   Ambulation assist   Ambulation activity did not occur: Safety/medical concerns          Walk 10 feet activity   Assist  Walk 10 feet activity did not occur: Safety/medical concerns        Walk 50 feet activity   Assist Walk 50 feet with 2 turns activity did not occur: Safety/medical concerns         Walk 150  feet activity   Assist Walk 150 feet activity did not occur: Safety/medical concerns         Walk 10 feet on uneven surface  activity   Assist Walk 10 feet on uneven surfaces activity did not occur: Safety/medical concerns         Wheelchair     Assist Will patient use wheelchair at discharge?: Yes Type of Wheelchair: Manual    Wheelchair assist level: Supervision/Verbal cueing Max wheelchair distance: 100'    Wheelchair 50 feet with 2 turns activity    Assist        Assist Level: Supervision/Verbal cueing   Wheelchair 150 feet activity     Assist      Assist Level: Supervision/Verbal cueing   Blood pressure 123/80, pulse 86, temperature 98.9 F (37.2 C), temperature source Oral, resp. rate 17, height 5' 5.5" (1.664 m), weight 72.9 kg, SpO2 100 %.  Medical Problem List and Plan: 1.  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  Antithrombotics: -DVT/anticoagulation:  Pharmaceutical: Heparin 1/20- changed to Lovenox             -antiplatelet therapy: N/a 3. Pain Management: Currently using hydrocodone with flexeril prn- changed from robaxin, since wasn't working   1/20- pt reports pain meds make her hurt "worse"- refusing vicodin and "allergic to tramadol" and refused cymbalta- says gabapentin causes swelling- will con't tylenol prn- cannot do excedrin due to bleeding risk  1/21- encouraged pt to continue taking keppra since I don't think it's the cause of increased pain- I think it's meds wearing off- that's more likely- explained to pt.   1/23- will try to increase Keppra to 500 mg BID for nerve pain- cont' tylenol since pt refuses everything else.   1/28- pain more manageable- con't regimen 4. Mood: LCSW to follow for  evaluation and support.              -antipsychotic agents:  N/A 5. Neuropsych: This patient is capable of making decisions on her own behalf. 6. Skin/Wound Care: Monitor wound for healing.  7. Fluids/Electrolytes/Nutrition: Monitor I/O. Check lytes in am.  8.  HTN: Monitor BP tid--having HYPOTENSION per therapy notes and pt- will stop Norvasc 5 mg daily and see if does ok- might need midodrine for hypotension   1/19- BP very slightly high- due to off Norvasc- but hopefully will help orthostatic hypotension- will monitor  1/20- will restart Norvasc 2.5 mg daily since BP 156/91 this AM  1/21- BP 120/73 this AM- better with low dose Norvasc  1/22- BP 120s/but need to start Flomax- so will start Midodrine 2.5 mg TID with meals, so leaves room in BP  for flomax to lower BP.   1/28- BP much better- 110s-120s/70s- con't low dose Norvasc and low dose Midrodrine, since when stopped Norvasc, BP went up to 160-170s Systolic.  9. Hypokalemia: Preadmission labs showed K+2.9. ON Kdur 40 meq bid--check follow up labs today. Fluids d/c yesterday.   1/27- K+ 4.1- doing well- con't regimen 10. Mild anemia: Recheck CBC for post op labs--likely has ABLA.   11.  COPD: Respiratory status stable on Dulera bid. Albuterol prn SOB.  12. Neurogenic bladder:  Will d/c foley  In a couple of days- right now cannot tolerate Flomax due to low BP issues- will give it a few days, since having to start to deal with bowel program today- will remove Friday vs Monday-   1/19- foley removed  1/22- will start Flomax- per above about Midodrine so can tolerate Flomax.   1/23- started flomax- will take days if will help.  1/25- pt to learn to cath- needs mirror  1/26- pt voided some- will see if able to void- voiding "some" but pushing- hard to tell if pushing too hard.   1/27- pt voiding some on toilet- as well as having small BMs- con't Flomax  1/28- hasn't required in/out caths for 24 hours- con't regimen ? Bladder spasms vs , ZPP  dysesthesia 13. Neurogenic bowel: Has had constipation for past few months (goes once a week). She had multiple BMs (hard balls -->liquid) yesterday after mag citrate. Will order KUB to check stool burden.   Will continue Senna S with miralax daily. Schedule suppository after supper.     - will make sure we start bowel program this evening- discussed at length with pt about bowel program.  1/22- no BM with bowel program- had accident this AM  1/23- pt says had Bowel program- not documented in chart  1/25- bowel program went well- con't regimen  1/26- had results on toilet- working better  1/27- results on bowel program last night AND results on toilet overnight.   1/28- has had BM on toilet- hasn't needed bowel program 14. At level SCI pain- starting Cymbalta 20 mg QHS and will titrate up. Already on gabapentin but not working so far.  1/20- changed to keppra 250 mg BID for nerve pain  1/21- pt doesn't want to take- I asked her oto continue.  15. Low grade fever this AM  1/19- WBC normal- no left shift seen- get U/A and Cx- and monitor- if U/A (+), will start PO ABX.   1/20- U/A (-)  1/22- no more fevers  1/27- rechecked U/A on 1/25- (+) but had 30k Ecoli- sensitive to everything- Sx's improving- wait on starting ABX since not 100k EColi.  16. Severe depression  1/22- has refused anti-depressants so far-  1/23- discussed again- will start Paxil 10 mg QHS to try and avoid side effects.   Pt states this med makes her tired  1/25- con't Paxil if possible.   1/26- still taking per chart- brighter affect  1/28- bright affect- handling much better     LOS: 10 days A FACE TO FACE EVALUATION WAS PERFORMED  Toshia Larkin 10/17/2020, 8:54 AM

## 2020-10-17 NOTE — Progress Notes (Signed)
Recreational Therapy Session Note  Patient Details  Name: Mary Davila MRN: [id number] Date of Birth: Dec 11, 1955 Today's Date: 10/17/2020  Pain: no c/o Skilled Therapeutic Interventions/Progress Updates: Session focused on activity tolerance, dynamic sitting balance during co-treat with PT.  Pt propelled w/c on the unit with supervision using BUEs.  Once in the dayroom, pt performed PT directed BLE exercise on the kinetron while discussion coping strategies, specifically use of music.  Transitioned to sitting EOM via squat pivot transfer with mod assist, instructional cues.  Once EOM, pt participated in therapeutic dance with supervision to music of choice.  Pt smiling, laughing & appreciative during session.  Therapy/Group: Co-Treatment Mcclain Shall 10/17/2020, 12:05 PM

## 2020-10-18 NOTE — Progress Notes (Signed)
[facility] PHYSICAL MEDICINE & REHABILITATION PROGRESS NOTE   Subjective/Complaints:  Able to empty bladder on toilet, Right buttocks still numb, cannot "pinch cheeks together "  More fleeling in top of foot than the bottom   ROS:  Pt denies SOB, abd pain, CP, N/V/C/D, and vision changes   Objective:   No results found. No results for input(s): WBC, HGB, HCT, PLT in the last 72 hours. No results for input(s): NA, K, CL, CO2, GLUCOSE, BUN, CREATININE, CALCIUM in the last 72 hours.  Intake/Output Summary (Last 24 hours) at 10/18/2020 0734 Last data filed at 10/17/2020 1900 Gross per 24 hour  Intake 800 ml  Output -  Net 800 ml        Physical Exam: Vital Signs Blood pressure (!) 143/86, pulse 74, temperature 97.9 F (36.6 C), resp. rate 18, height 5' 5.5" (1.664 m), weight 72.9 kg, SpO2 100 %.   General: No acute distress Mood and affect are appropriate Heart: Regular rate and rhythm no rubs murmurs or extra sounds Lungs: Clear to auscultation, breathing unlabored, no rales or wheezes Abdomen: Positive bowel sounds, soft nontender to palpation, nondistended Extremities: No clubbing, cyanosis, or edema   Musculoskeletal:     Comments: UE's 5/5 in deltoid, biceps, triceps, WE grip and finger abd LE's RLE- HF 2-/5, KE 3+/5, DF and PF 0/5- unchanged  LLE- HF 1/5, KE 3+/5, DF and PF 0/5 - unchanged Skin: back dressing in place - honeycomb- no drainage on dressing C/D/I- not assessed today Neurological: Ox3- but still slowed processing    Comments: Sensation decreased at T12  To S5- dramatic reduction in sensation in T11/12 but when eyes closed, wasn't sure where I was touching her from L1-S1 nor S2-S5 B/L  B/L foot drop as above - no change Psychiatric: calmer and much brighter  Assessment/Plan: 1. Functional deficits which require 3+ hours per day of interdisciplinary therapy in a comprehensive inpatient rehab setting.  Physiatrist is providing close team  supervision and 24 hour management of active medical problems listed below.  Physiatrist and rehab team continue to assess barriers to discharge/monitor patient progress toward functional and medical goals  Care Tool:  Bathing    Body parts bathed by patient: Right arm,Left arm,Chest,Abdomen,Front perineal area,Right upper leg,Left upper leg,Face   Body parts bathed by helper: Buttocks,Right lower leg,Left lower leg Body parts n/a: Right lower leg,Left lower leg   Bathing assist Assist Level: Moderate Assistance - Patient 50 - 74%     Upper Body Dressing/Undressing Upper body dressing Upper body dressing/undressing activity did not occur (including orthotics): Safety/medical concerns What is the patient wearing?: Pull over shirt    Upper body assist Assist Level: Supervision/Verbal cueing    Lower Body Dressing/Undressing Lower body dressing      What is the patient wearing?: Pants,Incontinence brief     Lower body assist Assist for lower body dressing: Maximal Assistance - Patient 25 - 49%     Toileting Toileting Toileting Activity did not occur (Clothing management and hygiene only): N/A (no void or bm)  Toileting assist Assist for toileting: Maximal Assistance - Patient 25 - 49%     Transfers Chair/bed transfer  Transfers assist  Chair/bed transfer activity did not occur: Safety/medical concerns  Chair/bed transfer assist level: Moderate Assistance - Patient 50 - 74% Chair/bed transfer assistive device:  (squat pivot)   Locomotion Ambulation   Ambulation assist   Ambulation activity did not occur: Safety/medical concerns  Walk 10 feet activity   Assist  Walk 10 feet activity did not occur: Safety/medical concerns        Walk 50 feet activity   Assist Walk 50 feet with 2 turns activity did not occur: Safety/medical concerns         Walk 150 feet activity   Assist Walk 150 feet activity did not occur: Safety/medical concerns          Walk 10 feet on uneven surface  activity   Assist Walk 10 feet on uneven surfaces activity did not occur: Safety/medical concerns         Wheelchair     Assist Will patient use wheelchair at discharge?: Yes Type of Wheelchair: Manual    Wheelchair assist level: Supervision/Verbal cueing Max wheelchair distance: 100'    Wheelchair 50 feet with 2 turns activity    Assist        Assist Level: Supervision/Verbal cueing   Wheelchair 150 feet activity     Assist      Assist Level: Supervision/Verbal cueing   Blood pressure (!) 143/86, pulse 74, temperature 97.9 F (36.6 C), resp. rate 18, height 5' 5.5" (1.664 m), weight 72.9 kg, SpO2 100 %.  Medical Problem List and Plan: 1.  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  Antithrombotics: -DVT/anticoagulation:  Pharmaceutical: Heparin 1/20- changed to Lovenox             -antiplatelet therapy: N/a 3. Pain Management: Currently using hydrocodone with flexeril prn- changed from robaxin, since wasn't working   1/20- pt reports pain meds make her hurt "worse"- refusing vicodin and "allergic to tramadol" and refused cymbalta- says gabapentin causes swelling- will con't tylenol prn- cannot do excedrin due to bleeding risk  1/21- encouraged pt to continue taking keppra since I don't think it's the cause of increased pain- I think it's meds wearing off- that's more likely- explained to pt.   1/23- will try to increase Keppra to 500 mg BID for nerve pain- cont' tylenol since pt refuses everything else.   1/28- pain more manageable- con't regimen 4. Mood: LCSW to follow for evaluation and support.              -antipsychotic agents:  N/A 5.  Neuropsych: This patient is capable of making decisions on her own behalf. 6. Skin/Wound Care: Monitor wound for healing.  7. Fluids/Electrolytes/Nutrition: Monitor I/O. Check lytes in am.  8.  HTN: Monitor BP tid--having HYPOTENSION per therapy notes and pt- will stop Norvasc 5 mg daily and see if does ok- might need midodrine for hypotension   1/19- BP very slightly high- due to off Norvasc- but hopefully will help orthostatic hypotension- will monitor  1/20- will restart Norvasc 2.5 mg daily since BP 156/91 this AM  1/21- BP 120/73 this AM- better with low dose Norvasc  1/22- BP 120s/but need to start Flomax- so will start Midodrine 2.5 mg TID with meals, so leaves room in BP for flomax to lower BP.   1/28- BP much better- 110s-120s/70s- con't low dose Norvasc and low dose Midrodrine, since when stopped Norvasc, BP went up to  160-170s Systolic Vitals:   10/17/20 2006 10/18/20 0433  BP: 130/77 (!) 143/86  Pulse: 77 74  Resp: 16 18  Temp: 97.7 F (36.5 C) 97.9 F (36.6 C)  SpO2: 100% 100%  .  9. Hypokalemia: Preadmission labs showed K+2.9. ON Kdur 40 meq bid--check follow up labs today. Fluids d/c yesterday.   1/27- K+ 4.1- doing well- con't regimen 10. Mild anemia: Recheck CBC for post op labs--likely has ABLA.   11.  COPD: Respiratory status stable on Dulera bid. Albuterol prn SOB.  12. Neurogenic bladder:  Will d/c foley  In a couple of days- right now cannot tolerate Flomax due to low BP issues- will give it a few days, since having to start to deal with bowel program today- will remove Friday vs Monday-   1/19- foley removed  1/22- will start Flomax- per above about Midodrine so can tolerate Flomax.   1/23- started flomax- will take days if will help.  1/25- pt to learn to cath- needs mirror  1/26- pt voided some- will see if able to void- voiding "some" but pushing- hard to tell if pushing too hard.   1/27- pt voiding some on toilet- as well as having small BMs- con't  Flomax  1/28- hasn't required in/out caths for 24 hours- con't regimen ? Bladder spasms vs , ZPP dysesthesia 13. Neurogenic bowel: Has had constipation for past few months (goes once a week). She had multiple BMs (hard balls -->liquid) yesterday after mag citrate. Will order KUB to check stool burden.   Will continue Senna S with miralax daily. Schedule suppository after supper.     - will make sure we start bowel program this evening- discussed at length with pt about bowel program.  1/22- no BM with bowel program- had accident this AM  1/23- pt says had Bowel program- not documented in chart  1/25- bowel program went well- con't regimen  1/26- had results on toilet- working better  1/27- results on bowel program last night AND results on toilet overnight.   1/28- has had BM on toilet- hasn't needed bowel program 14. At level SCI pain- starting Cymbalta 20 mg QHS and will titrate up. Already on gabapentin but not working so far.  1/20- changed to keppra 250 mg BID for nerve pain  1/21- pt doesn't want to take- I asked her oto continue.  15. Low grade fever this AM  1/19- WBC normal- no left shift seen- get U/A and Cx- and monitor- if U/A (+), will start PO ABX.   1/20- U/A (-)  1/22- no more fevers  1/27- rechecked U/A on 1/25- (+) but had 30k Ecoli- sensitive to everything- Sx's improving- wait on starting ABX since not 100k EColi.  16. Severe depression  1/22- has refused anti-depressants so far-  1/23- discussed again- will start Paxil 10 mg QHS to try and avoid side effects.   Pt states this med makes her tired  1/25- con't Paxil if possible.   1/26- still taking per chart- brighter affect  1/28- bright affect- handling much better     LOS: 11 days A FACE TO FACE EVALUATION WAS PERFORMED  Erick Colace 10/18/2020, 7:34 AM

## 2020-10-18 NOTE — Progress Notes (Signed)
PRN cyclobenzaprine given for muscle spasms. Pt feet elevated. Pt refused to wear PRAFOS Mylo Red, LPN

## 2020-10-18 NOTE — Progress Notes (Addendum)
Pt up to toilet before bowel program. Pt had medium soft BM in toilet. Pt returned to bed for bowel program. Dig stim started with small results noted. Suppository inserted. Pt on bedpan at this time. Call light in reach.  1925: Bowel program completed. Small results noted. Pt tolerated well.  Mylo Red, LPN

## 2020-10-19 NOTE — Progress Notes (Signed)
Bowel program started. Digital stimulation with no results. Suppository inserted. Mary Davila

## 2020-10-19 NOTE — Progress Notes (Signed)
Occupational Therapy Session Note  Patient Details  Name: Mary Davila MRN: [id number] Date of Birth: [birth year]  Today's Date: 10/19/2020 OT Group Time: 1100-1200 OT Group Time Calculation (min): 60 min  Skilled Therapeutic Interventions/Progress Updates:    Pt engaged in therapeutic w/c level dance group focusing on patient choice, UE/LE strengthening, salience, activity tolerance, and social participation. Pt was guided through various dance-based exercises involving UEs/LEs and trunk. All music was selected by group members. Emphasis placed on UB strengthening and LE NMR. Pt exhibited high (!) levels of participation throughout group, actively moving her LEs in beat to music to the best of her abilities, affect bright and cheerful. At end of session, RT escorted pt back to the room.    Therapy Documentation Precautions:  Precautions Precautions: Back,Fall Precaution Comments: Reviewed back precautions Required Braces or Orthoses:  (no brace needed) Other Brace: no brace needed per chart/orders Restrictions Weight Bearing Restrictions: No Vital Signs: Therapy Vitals Temp: 98.2 F (36.8 C) Temp Source: Oral Pulse Rate: 68 Resp: 17 BP: (!) 149/84 Oxygen Therapy SpO2: 100 % O2 Device: Room Air Pain: no s/s pain during session   ADL: ADL Eating: Set up Grooming: Setup Where Assessed-Grooming: Bed level Upper Body Bathing: Minimal assistance,Minimal cueing Where Assessed-Upper Body Bathing: Edge of bed Lower Body Bathing: Maximal assistance Where Assessed-Lower Body Bathing: Bed level Upper Body Dressing: Minimal assistance Where Assessed-Upper Body Dressing: Edge of bed Lower Body Dressing: Maximal assistance Where Assessed-Lower Body Dressing: Bed level Toileting: Unable to assess Toilet Transfer: Unable to assess Toilet Transfer Method: Unable to assess Tub/Shower Transfer: Unable to assess      Therapy/Group: Group Therapy  Rexford Prevo A Patton Swisher 10/19/2020, 3:27  PM

## 2020-10-19 NOTE — Progress Notes (Signed)
[facility] PHYSICAL MEDICINE & REHABILITATION PROGRESS NOTE   Subjective/Complaints:  Notes swelling in legs, we discussed that this is likely related to weakness  Also c/o freq urination since starting flomax , no burning with urination   No therapy today asking to be up in recliner  ROS:  Pt denies SOB, abd pain, CP, N/V//D, and vision changes   Objective:   No results found. No results for input(s): WBC, HGB, HCT, PLT in the last 72 hours. No results for input(s): NA, K, CL, CO2, GLUCOSE, BUN, CREATININE, CALCIUM in the last 72 hours.  Intake/Output Summary (Last 24 hours) at 10/19/2020 0804 Last data filed at 10/18/2020 1845 Gross per 24 hour  Intake 658 ml  Output -  Net 658 ml        Physical Exam: Vital Signs Blood pressure (!) 142/87, pulse 82, temperature 98.7 F (37.1 C), resp. rate 18, height 5' 5.5" (1.664 m), weight 72.9 kg, SpO2 100 %.  General: No acute distress Mood and affect are appropriate Heart: Regular rate and rhythm no rubs murmurs or extra sounds Lungs: Clear to auscultation, breathing unlabored, no rales or wheezes Abdomen: Positive bowel sounds, soft nontender to palpation, nondistended Extremities: No clubbing, cyanosis, or edema   Musculoskeletal:     Comments: UE's 5/5 in deltoid, biceps, triceps, WE grip and finger abd LE's RLE- HF 2-/5, KE 3+/5, DF and PF 0/5- unchanged  LLE- HF 1/5, KE 3+/5, DF and PF 0/5 - unchanged Skin: back dressing in place - honeycomb- no drainage on dressing C/D/I- not assessed today Neurological: Ox3- but still slowed processing   B/L foot drop as above - no change Psychiatric: calmer and much brighter  Assessment/Plan: 1. Functional deficits which require 3+ hours per day of interdisciplinary therapy in a comprehensive inpatient rehab setting.  Physiatrist is providing close team supervision and 24 hour management of active medical problems listed below.  Physiatrist and rehab team continue to  assess barriers to discharge/monitor patient progress toward functional and medical goals  Care Tool:  Bathing    Body parts bathed by patient: Right arm,Left arm,Chest,Abdomen,Front perineal area,Right upper leg,Left upper leg,Face   Body parts bathed by helper: Buttocks,Right lower leg,Left lower leg Body parts n/a: Right lower leg,Left lower leg   Bathing assist Assist Level: Moderate Assistance - Patient 50 - 74%     Upper Body Dressing/Undressing Upper body dressing Upper body dressing/undressing activity did not occur (including orthotics): Safety/medical concerns What is the patient wearing?: Pull over shirt    Upper body assist Assist Level: Supervision/Verbal cueing    Lower Body Dressing/Undressing Lower body dressing      What is the patient wearing?: Pants,Incontinence brief     Lower body assist Assist for lower body dressing: Maximal Assistance - Patient 25 - 49%     Toileting Toileting Toileting Activity did not occur (Clothing management and hygiene only): N/A (no void or bm)  Toileting assist Assist for toileting: Maximal Assistance - Patient 25 - 49%     Transfers Chair/bed transfer  Transfers assist  Chair/bed transfer activity did not occur: Safety/medical concerns  Chair/bed transfer assist level: Moderate Assistance - Patient 50 - 74% Chair/bed transfer assistive device:  (squat pivot)   Locomotion Ambulation   Ambulation assist   Ambulation activity did not occur: Safety/medical concerns          Walk 10 feet activity   Assist  Walk 10 feet activity did not occur: Safety/medical concerns  Walk 50 feet activity   Assist Walk 50 feet with 2 turns activity did not occur: Safety/medical concerns         Walk 150 feet activity   Assist Walk 150 feet activity did not occur: Safety/medical concerns         Walk 10 feet on uneven surface  activity   Assist Walk 10 feet on uneven surfaces activity did not  occur: Safety/medical concerns         Wheelchair     Assist Will patient use wheelchair at discharge?: Yes Type of Wheelchair: Manual    Wheelchair assist level: Supervision/Verbal cueing Max wheelchair distance: 100'    Wheelchair 50 feet with 2 turns activity    Assist        Assist Level: Supervision/Verbal cueing   Wheelchair 150 feet activity     Assist      Assist Level: Supervision/Verbal cueing   Blood pressure (!) 142/87, pulse 82, temperature 98.7 F (37.1 C), resp. rate 18, height 5' 5.5" (1.664 m), weight 72.9 kg, SpO2 100 %.  Medical Problem List and Plan: 1.  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  Antithrombotics: -DVT/anticoagulation:  Pharmaceutical: Heparin 1/20- changed to Lovenox             -antiplatelet therapy: N/a 3. Pain Management: Currently using hydrocodone with flexeril prn- changed from robaxin, since wasn't working   1/20- pt reports pain meds make her hurt "worse"- refusing vicodin and "allergic to tramadol" and refused cymbalta- says gabapentin causes swelling- will con't tylenol prn- cannot do excedrin due to bleeding risk  1/21- encouraged pt to continue taking keppra since I don't think it's the cause of increased pain- I think it's meds wearing off- that's more likely- explained to pt.   1/23- will try to increase Keppra to 500 mg BID for nerve pain- cont' tylenol since pt refuses everything else.   1/28- pain more manageable- con't regimen 4. Mood: LCSW to follow for evaluation and support.              -antipsychotic agents:  N/A 5. Neuropsych: This patient is capable of making decisions on her own behalf. 6. Skin/Wound Care: Monitor wound for  healing.  7. Fluids/Electrolytes/Nutrition: Monitor I/O. Check lytes in am.  8.  HTN: Monitor BP tid--having HYPOTENSION per therapy notes and pt- will stop Norvasc 5 mg daily and see if does ok- might need midodrine for hypotension   1/19- BP very slightly high- due to off Norvasc- but hopefully will help orthostatic hypotension- will monitor  1/20- will restart Norvasc 2.5 mg daily since BP 156/91 this AM  1/21- BP 120/73 this AM- better with low dose Norvasc  1/22- BP 120s/but need to start Flomax- so will start Midodrine 2.5 mg TID with meals, so leaves room in BP for flomax to lower BP.   1/28- BP much better- 110s-120s/70s- con't low dose Norvasc and low dose Midrodrine, since when stopped Norvasc, BP went up to 160-170s Systolic Vitals:   10/18/20 1950 10/19/20 0412  BP:  (!) 142/87  Pulse:  82  Resp:  18  Temp:  98.7 F (37.1 C)  SpO2: 99% 100%  .  9. Hypokalemia: Preadmission labs showed K+2.9. ON Kdur 40 meq bid--check follow up labs today. Fluids d/c yesterday.   1/27- K+ 4.1- doing well- con't regimen 10. Mild anemia: Recheck CBC for post op labs--likely has ABLA.   11.  COPD: Respiratory status stable on Dulera bid. Albuterol prn SOB.  12. Neurogenic bladder:  Will d/c foley  In a couple of days- right now cannot tolerate Flomax due to low BP issues- will give it a few days, since having to start to deal with bowel program today- will remove Friday vs Monday-   1/19- foley removed  1/22- will start Flomax- per above about Midodrine so can tolerate Flomax.   1/23- started flomax- will take days if will help.  1/25- pt to learn to cath- needs mirror  1/26- pt voided some- will see if able to void- voiding "some" but pushing- hard to tell if pushing too hard.   1/27- pt voiding some on toilet- as well as having small BMs- con't Flomax  1/28- hasn't required in/out caths for 24 hours- con't regimen C/o freq voids q1h, will stop flomax ( and midodrine) check PVRs  ? Bladder  spasms vs , ZPP dysesthesia 13. Neurogenic bowel: Has had constipation for past few months (goes once a week). She had multiple BMs (hard balls -->liquid) yesterday after mag citrate. Will order KUB to check stool burden.   Will continue Senna S with miralax daily. Schedule suppository after supper.     - will make sure we start bowel program this evening- discussed at length with pt about bowel program.  1/22- no BM with bowel program- had accident this AM  1/23- pt says had Bowel program- not documented in chart  1/25- bowel program went well- con't regimen  1/26- had results on toilet- working better  1/27- results on bowel program last night AND results on toilet overnight.   1/28- has had BM on toilet- hasn't needed bowel program 14. At level SCI pain- starting Cymbalta 20 mg QHS and will titrate up. Already on gabapentin but not working so far.  1/20- changed to keppra 250 mg BID for nerve pain  1/21- pt doesn't want to take- I asked her oto continue.  15. Low grade fever this AM  1/19- WBC normal- no left shift seen- get U/A and Cx- and monitor- if U/A (+), will start PO ABX.   1/20- U/A (-)  1/22- no more fevers  1/27- rechecked U/A on 1/25- (+) but had 30k Ecoli- sensitive to everything- Sx's improving- wait on starting ABX since not 100k EColi.  16. Severe depression  1/22- has refused anti-depressants so far-  1/23- discussed again- will start Paxil 10 mg QHS to try and avoid side effects.   Pt states this med makes her tired  1/25- con't Paxil if possible.   1/26- still taking per chart- brighter affect  1/28- bright affect- handling much better  17 peripheral edema, r/t weakness in LEs rec elevation in recliner during the day today (no therapy) and knee hi teds    LOS: 12 days A FACE TO FACE EVALUATION WAS PERFORMED  Erick Colace 10/19/2020, 8:04 AM

## 2020-10-20 ENCOUNTER — Inpatient Hospital Stay (HOSPITAL_COMMUNITY): Payer: Medicare HMO

## 2020-10-20 DIAGNOSIS — M7989 Other specified soft tissue disorders: Secondary | ICD-10-CM

## 2020-10-20 DIAGNOSIS — R609 Edema, unspecified: Secondary | ICD-10-CM

## 2020-10-20 LAB — CBC
HCT: 28.3 % — ABNORMAL LOW (ref 36.0–46.0)
Hemoglobin: 9.5 g/dL — ABNORMAL LOW (ref 12.0–15.0)
MCH: 30.4 pg (ref 26.0–34.0)
MCHC: 33.6 g/dL (ref 30.0–36.0)
MCV: 90.4 fL (ref 80.0–100.0)
Platelets: 408 10*3/uL — ABNORMAL HIGH (ref 150–400)
RBC: 3.13 MIL/uL — ABNORMAL LOW (ref 3.87–5.11)
RDW: 13.9 % (ref 11.5–15.5)
WBC: 4.9 10*3/uL (ref 4.0–10.5)
nRBC: 0 % (ref 0.0–0.2)

## 2020-10-20 NOTE — Progress Notes (Signed)
Lower extremity venous has been completed.   Preliminary results in CV Proc.   Blanch Media 10/20/2020 2:25 PM

## 2020-10-20 NOTE — Progress Notes (Signed)
[facility] PHYSICAL MEDICINE & REHABILITATION PROGRESS NOTE   Subjective/Complaints:  Pt had doctor stop Flomax this weekend, since "having frequent urination" while on Flomax- except she hadn't started voiding at all until was placed on Flomax.   Also c/o feet swelling- had over weekend- felt due to be due to weakness per Dr Wynn Banker note.   Didn't get BMP drawn this AM again- for some reason, even though order in computer- will order again for tomorrow.     ROS:  Pt denies SOB, abd pain, CP, N/V/C/D, and vision changes   Objective:   No results found. Recent Labs    10/20/20 0500  WBC 4.9  HGB 9.5*  HCT 28.3*  PLT 408*   No results for input(s): NA, K, CL, CO2, GLUCOSE, BUN, CREATININE, CALCIUM in the last 72 hours.  Intake/Output Summary (Last 24 hours) at 10/20/2020 0837 Last data filed at 10/19/2020 1410 Gross per 24 hour  Intake 240 ml  Output -  Net 240 ml        Physical Exam: Vital Signs Blood pressure (!) 112/59, pulse 99, temperature 97.8 F (36.6 C), resp. rate 18, height 5' 5.5" (1.664 m), weight 72.9 kg, SpO2 100 %.  General: sitting up on toilet- used steady to get there, NAD Mood and affect are more interactive, still a little flat Heart: borderline tachycardia- reg rhythm Lungs: CTA B/L- no W/R/R- good air movement Abdomen:Soft, NT, ND, (+)BS  Extremities: No clubbing, cyanosis, or edema  Musculoskeletal: 1-2+ LE foot/ankle swelling B/L    Comments: UE's 5/5 in deltoid, biceps, triceps, WE grip and finger abd LE's RLE- HF 2-/5, KE 3+/5, DF and PF 0/5- unchanged  LLE- HF 1/5, KE 3+/5, DF and PF 0/5 - unchanged Skin: back dressing in place - honeycomb- no drainage on dressing C/D/I- not assessed today Neurological: Ox3- slowed understanding/processing   B/L foot drop as above - no change Psychiatric: calmer and much brighter  Assessment/Plan: 1. Functional deficits which require 3+ hours per day of interdisciplinary therapy in a  comprehensive inpatient rehab setting.  Physiatrist is providing close team supervision and 24 hour management of active medical problems listed below.  Physiatrist and rehab team continue to assess barriers to discharge/monitor patient progress toward functional and medical goals  Care Tool:  Bathing    Body parts bathed by patient: Right arm,Left arm,Chest,Abdomen,Front perineal area,Right upper leg,Left upper leg,Face   Body parts bathed by helper: Buttocks,Right lower leg,Left lower leg Body parts n/a: Right lower leg,Left lower leg   Bathing assist Assist Level: Moderate Assistance - Patient 50 - 74%     Upper Body Dressing/Undressing Upper body dressing Upper body dressing/undressing activity did not occur (including orthotics): Safety/medical concerns What is the patient wearing?: Pull over shirt    Upper body assist Assist Level: Supervision/Verbal cueing    Lower Body Dressing/Undressing Lower body dressing      What is the patient wearing?: Pants,Incontinence brief     Lower body assist Assist for lower body dressing: Maximal Assistance - Patient 25 - 49%     Toileting Toileting Toileting Activity did not occur (Clothing management and hygiene only): N/A (no void or bm)  Toileting assist Assist for toileting: Maximal Assistance - Patient 25 - 49%     Transfers Chair/bed transfer  Transfers assist  Chair/bed transfer activity did not occur: Safety/medical concerns  Chair/bed transfer assist level: Moderate Assistance - Patient 50 - 74% Chair/bed transfer assistive device:  (squat pivot)   Locomotion Ambulation  Ambulation assist   Ambulation activity did not occur: Safety/medical concerns          Walk 10 feet activity   Assist  Walk 10 feet activity did not occur: Safety/medical concerns        Walk 50 feet activity   Assist Walk 50 feet with 2 turns activity did not occur: Safety/medical concerns         Walk 150 feet  activity   Assist Walk 150 feet activity did not occur: Safety/medical concerns         Walk 10 feet on uneven surface  activity   Assist Walk 10 feet on uneven surfaces activity did not occur: Safety/medical concerns         Wheelchair     Assist Will patient use wheelchair at discharge?: Yes Type of Wheelchair: Manual    Wheelchair assist level: Supervision/Verbal cueing Max wheelchair distance: 100'    Wheelchair 50 feet with 2 turns activity    Assist        Assist Level: Supervision/Verbal cueing   Wheelchair 150 feet activity     Assist      Assist Level: Supervision/Verbal cueing   Blood pressure (!) 112/59, pulse 99, temperature 97.8 F (36.6 C), resp. rate 18, height 5' 5.5" (1.664 m), weight 72.9 kg, SpO2 100 %.  Medical Problem List and Plan: 1.  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  Antithrombotics: -DVT/anticoagulation:  Pharmaceutical: Heparin 1/20- changed to Lovenox 1/31- will check Dopplers B/L since has acute onset LE edema in setting of SCI pt.              -antiplatelet therapy: N/a 3. Pain Management: Currently using hydrocodone with flexeril prn- changed from robaxin, since wasn't working   1/20- pt reports pain meds make her hurt "worse"- refusing vicodin and "allergic to tramadol" and refused cymbalta- says gabapentin causes swelling- will con't tylenol prn- cannot do excedrin due to bleeding risk  1/21- encouraged pt to continue taking keppra since I don't think it's the cause of increased pain- I think it's meds wearing off- that's more likely- explained to pt.   1/23- will try to increase Keppra to 500 mg BID for nerve pain- cont' tylenol since pt  refuses everything else.   1/31- pain more managebale, con't regimen 4. Mood: LCSW to follow for evaluation and support.              -antipsychotic agents:  N/A 5. Neuropsych: This patient is capable of making decisions on her own behalf. 6. Skin/Wound Care: Monitor wound for healing.  7. Fluids/Electrolytes/Nutrition: Monitor I/O. Check lytes in am.  8.  HTN: Monitor BP tid--having HYPOTENSION per therapy notes and pt- will stop Norvasc 5 mg daily and see if does ok- might need midodrine for hypotension   1/19- BP very slightly high- due to off Norvasc- but hopefully will help orthostatic hypotension- will monitor  1/20- will restart Norvasc 2.5 mg daily since BP 156/91 this AM  1/21- BP 120/73 this AM- better with low dose Norvasc  1/22- BP 120s/but need to start Flomax- so will start Midodrine 2.5  mg TID with meals, so leaves room in BP for flomax to lower BP.   1/28- BP much better- 110s-120s/70s- con't low dose Norvasc and low dose Midrodrine, since when stopped Norvasc, BP went up to 160-170s Systolic  1/31- BP controlled- they stopped Midodrine this weekend since off Flomax- is having LE edema- pt doesn't want to stop.  Vitals:   10/20/20 0400 10/20/20 0748  BP: (!) 112/59   Pulse: 99   Resp: 18   Temp: 97.8 F (36.6 C)   SpO2: 90% 100%  .  9. Hypokalemia: Preadmission labs showed K+2.9. ON Kdur 40 meq bid--check follow up labs today. Fluids d/c yesterday.   1/27- K+ 4.1- doing well- con't regimen 10. Mild anemia: Recheck CBC for post op labs--likely has ABLA.   11.  COPD: Respiratory status stable on Dulera bid. Albuterol prn SOB.  12. Neurogenic bladder:  Will d/c foley  In a couple of days- right now cannot tolerate Flomax due to low BP issues- will give it a few days, since having to start to deal with bowel program today- will remove Friday vs Monday-   1/19- foley removed  1/22- will start Flomax- per above about Midodrine so can tolerate Flomax.   1/23- started flomax-  will take days if will help.  1/25- pt to learn to cath- needs mirror  1/26- pt voided some- will see if able to void- voiding "some" but pushing- hard to tell if pushing too hard.   1/27- pt voiding some on toilet- as well as having small BMs- con't Flomax  1/28- hasn't required in/out caths for 24 hours- con't regimen C/o freq voids q1h, will stop flomax ( and midodrine) check PVRs  1/31 since flomax effect can reduce over time- will con't to check PVRs- will check for 3 days- I'm concerned that pt will stop being able to void off Flomax, but will see.  ? Bladder spasms vs , ZPP dysesthesia 13. Neurogenic bowel: Has had constipation for past few months (goes once a week). She had multiple BMs (hard balls -->liquid) yesterday after mag citrate. Will order KUB to check stool burden.   Will continue Senna S with miralax daily. Schedule suppository after supper.     - will make sure we start bowel program this evening- discussed at length with pt about bowel program.  1/22- no BM with bowel program- had accident this AM  1/23- pt says had Bowel program- not documented in chart  1/25- bowel program went well- con't regimen  1/26- had results on toilet- working better  1/27- results on bowel program last night AND results on toilet overnight.   1/28- has had BM on toilet- hasn't needed bowel program 14. At level SCI pain- starting Cymbalta 20 mg QHS and will titrate up. Already on gabapentin but not working so far.  1/20- changed to keppra 250 mg BID for nerve pain  1/21- pt doesn't want to take- I asked her oto continue.  15. Low grade fever this AM  1/19- WBC normal- no left shift seen- get U/A and Cx- and monitor- if U/A (+), will start PO ABX.   1/20- U/A (-)  1/22- no more fevers  1/27- rechecked U/A on 1/25- (+) but had 30k Ecoli- sensitive to everything- Sx's improving- wait on starting ABX since not 100k EColi.  16. Severe depression  1/22- has refused anti-depressants so far-  1/23-  discussed again- will start Paxil 10 mg QHS to try and avoid side effects.  Pt states this med makes her tired  1/25- con't Paxil if possible.   1/26- still taking per chart- brighter affect  1/28- bright affect- handling much better  17 peripheral edema, r/t weakness in LEs rec elevation in recliner during the day today (no therapy) and knee hi teds   1/31- will check Dopplers LE B/L due to continued LE swelling.    LOS: 13 days A FACE TO FACE EVALUATION WAS PERFORMED  Mary Davila 10/20/2020, 8:37 AM

## 2020-10-20 NOTE — Progress Notes (Addendum)
Patient ID: Mary Davila, female   DOB: 1956-08-22, 65 y.o.   MRN: [id number]  Digstem performed, suppository inserted, patient on left side, timer set for 15 min. Patient resting comfortably with call bell at side.  1843; 2nd digstem performed, rectal vault empty, patient positioned on left side, timer set for 15 min. Patient resting comfortably with call bell at side.  1900; 3rd digstem performed, patient now sitting on toilet. Will call when needing assistance. Report given to night shift nurse.

## 2020-10-20 NOTE — Progress Notes (Signed)
Occupational Therapy Session Note  Patient Details  Name: Mary Davila MRN: [id number] Date of Birth: 11-Jan-1956  Today's Date: 10/20/2020 OT Individual Time: 6803-2122 OT Individual Time Calculation (min): 70 min    Short Term Goals: Week 2:  OT Short Term Goal 1 (Week 2): Pt will perform drop arm BSC tranfser using SB with min a OT Short Term Goal 2 (Week 2): Pt will don pants at bed level with min A using AE PRN OT Short Term Goal 3 (Week 2): Pt will perform LB bahting at bed level using AE PRN OT Short Term Goal 4 (Week 2): Pt will perform toileting tasks seated on BSC with mod A for clothing management  Skilled Therapeutic Interventions/Progress Updates:    Pt resting in w/c upon arrival with pants pulled down to knees. Pt stated her incontinence brief needed to be adjusted. Sit<>stand in [place] with CGA. Therapist readjusted brief and pulled pants over hips with pt standing in [place]. Pt leans on her forearms when standing. Pt propelled w/c to gym and transferred to [facility] with squat/scoot transfer with CGA. Pt requested to practice transfers X 3. All transfers with CGA. EOM activities with focus on reaching outside BOS to R/L and forward. Pt hesitant when reaching outside BOS. Focus on core control with transitional movements. Pt transferred back to w/c with CGA. Pt propelled w/c back to room. Pt remained in w/c with all needs within reach.   Therapy Documentation Precautions:  Precautions Precautions: Back,Fall Precaution Comments: Reviewed back precautions Required Braces or Orthoses:  (no brace needed) Other Brace: no brace needed per chart/orders Restrictions Weight Bearing Restrictions: No Pain:  Pt states her Rt hip is "somewhat" painful; declined meds at this time; emotional support   Therapy/Group: Individual Therapy  Rich Brave 10/20/2020, 9:32 AM

## 2020-10-20 NOTE — Progress Notes (Signed)
Physical Therapy Session Note  Patient Details  Name: Mary Davila MRN: [id number] Date of Birth: December 16, 1955  Today's Date: 10/20/2020 PT Individual Time: 1000-1100; 1400-1500 PT Individual Time Calculation (min): 60 min and 60 min  Short Term Goals: Week 2:  PT Short Term Goal 1 (Week 2): Pt will perform bed mobility with min A consistently PT Short Term Goal 2 (Week 2): Pt will perform least restrictive transfer with min A consistently PT Short Term Goal 3 (Week 2): Pt will recall and be able to perform 2 pressure relief techniques  Skilled Therapeutic Interventions/Progress Updates:    Session 1: Pt received seated in w/c in room, agreeable to PT session. No complaints of pain at rest, does have onset of R thigh pain with mobility that subsides at rest. Manual w/c propulsion x 150 ft with use of BUE at Supervision level with cues for improved technique for decreased amount of strokes to decrease strain on B shoulders. Squat pivot transfer w/c to mat table with min A. Sit to stand x 10 reps to stedy with CGA from mat table, Supervision from stedy seat. Use of mirror for visual feedback for upright posture, manual and tactile cues for hip and knee extension. Pt able to perform minimal glute activation in standing as well as minimal quad activation with increased contraction detected as compared to previous week. Standing mini-squats x 10 reps, x 5 reps to fatigue with max multimodal cueing for glute and quad activation in standing. Pt continues to exhibit heavy UE reliance with standing. Sit to supine mod A for BLE management. Attempt supine bridges, trace glute activation detected. Supine glute sets 2 x 10 reps with focus on breathing techniques. Supine AAROM heel slides x 10 reps B with increased ability to assist with exercise noted this date. Pt returned to sitting EOB with mod A for BLE management. Squat pivot back to w/c with CGA. Pt requests to use bathroom at end of session. Stedy transfer to  elevated BSC over toilet with min A to stand from w/c seat height. Pt left seated on toilet in bathroom with call button in reach, NT notified of pt's location.  Session 2: Pt received seated in bed, agreeable to PT session. No complaints of pain. Semi-reclined to sitting EOB with mod A for BLE management, use of bedrail for trunk elevation and pillow between knees for improved patient comfort. Squat pivot transfer to w/c with CGA. Manual w/c propulsion 2 x 100 ft with use of BUE at Supervision level. Session focus on BLE NMR in standing with use of standing frame. Pt requires assist from standing frame sling to achieve full, upright stance. While in standing frame decreased support from sling and pt able to perform standing glute activation and mini-squats 2 x 10 reps. Pt noted to have heavy reliance on BUE support to maintain standing posture. Standing in frame with assist from sling x 10 min while engaging in card game to decrease reliance on BUE. After 10 min pt has onset of R lateral trunk/back spasm and assisted to sit in down in w/c. With seated rest break pt has improvement in symptoms. Seated L/R lateral leans x 10 reps with no increase in back pain. Pt requests to sit up in recliner at end of session. Sit to stand with min A to stedy. Stedy transfer to recliner from w/c. Pt left seated in recliner in room with needs in reach, BLE elevated at end of session.   Therapy Documentation Precautions:  Precautions Precautions:  Back,Fall Precaution Comments: Reviewed back precautions Required Braces or Orthoses:  (no brace needed) Other Brace: no brace needed per chart/orders Restrictions Weight Bearing Restrictions: No   Therapy/Group: Individual Therapy   Peter Congo, PT, DPT  10/20/2020, 12:28 PM

## 2020-10-21 ENCOUNTER — Inpatient Hospital Stay (HOSPITAL_COMMUNITY): Payer: Medicare HMO

## 2020-10-21 LAB — BASIC METABOLIC PANEL
Anion gap: 10 (ref 5–15)
BUN: 17 mg/dL (ref 8–23)
CO2: 26 mmol/L (ref 22–32)
Calcium: 9.7 mg/dL (ref 8.9–10.3)
Chloride: 107 mmol/L (ref 98–111)
Creatinine, Ser: 0.94 mg/dL (ref 0.44–1.00)
GFR, Estimated: >60 mL/min (ref >60)
Glucose, Bld: 93 mg/dL (ref 70–99)
Potassium: 4.5 mmol/L (ref 3.5–5.1)
Sodium: 143 mmol/L (ref 135–145)

## 2020-10-21 IMAGING — DX DG CHEST 2V
2 series · 2 of 2 positions shown · non-contrast
Comparison: None.

CLINICAL DATA: Swelling of legs for 2 days, shortness of breath,
recent spine surgery

EXAM:
CHEST - 2 VIEW

[x chest ap]
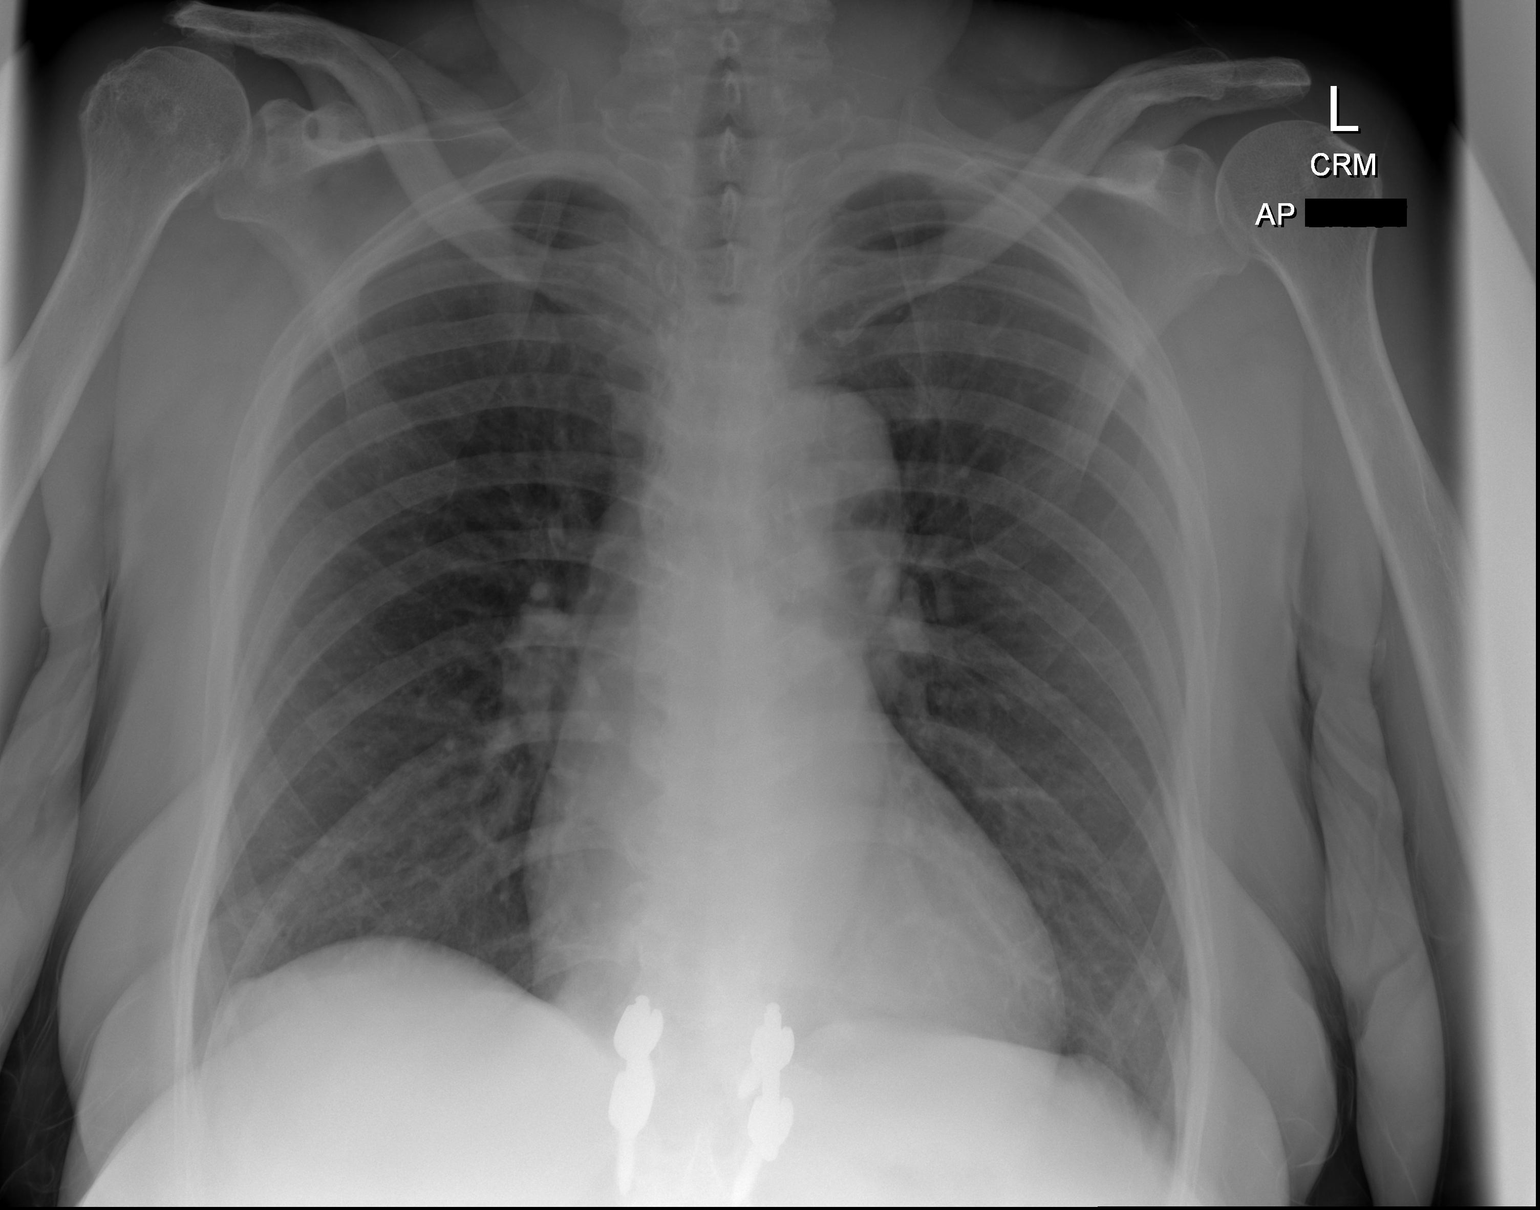

[w chest lat]
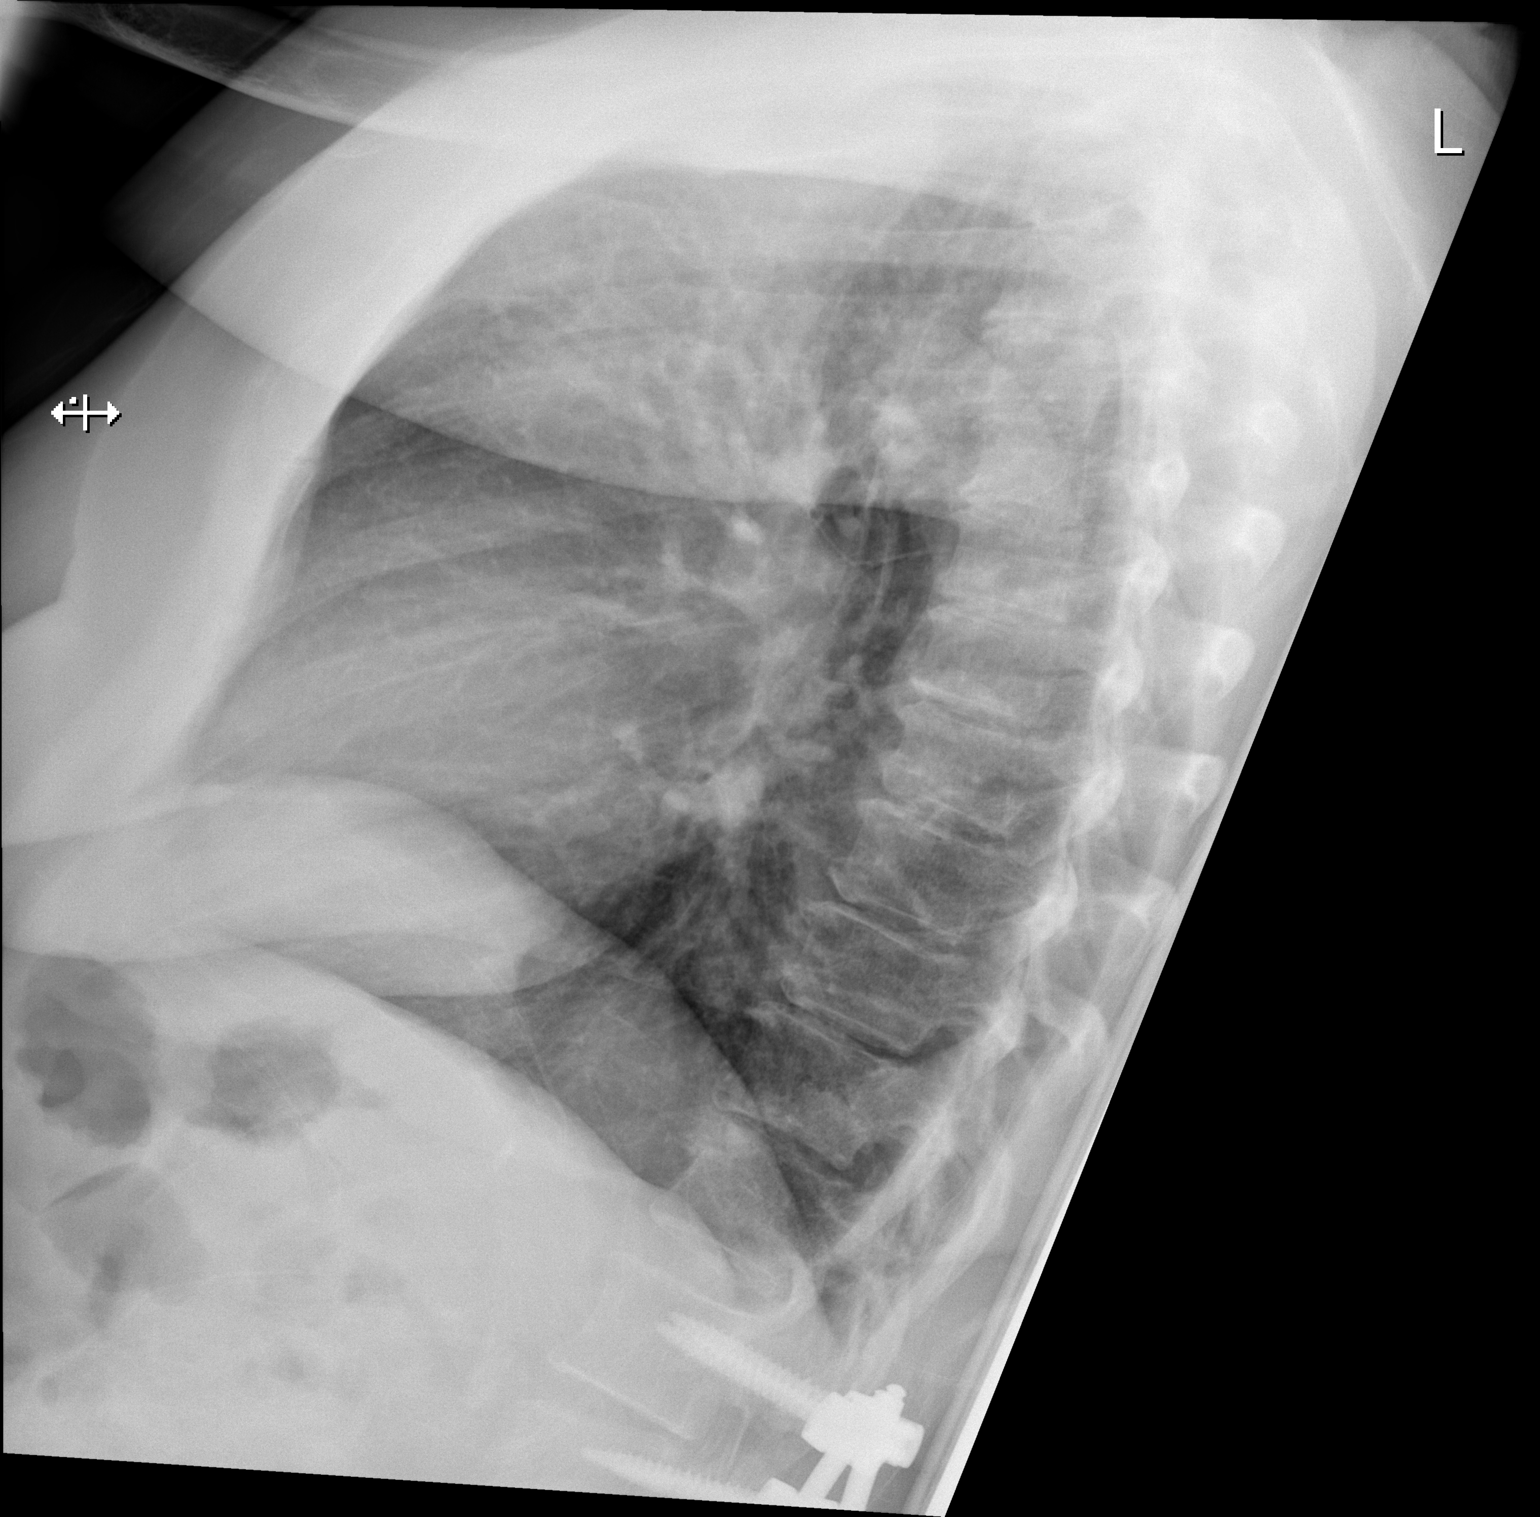

[2 of 2 positions shown; findings below may reference images not displayed]

FINDINGS: No consolidation, features of edema, pneumothorax, or effusion.
Pulmonary vascularity is normally distributed. The cardiomediastinal
contours are unremarkable. No acute osseous or soft tissue
abnormality. Degenerative changes are present in the imaged spine
and shoulders. Evidence of calcific tendinosis in the right
shoulder. Lumbar fusion hardware is partially visualized within the
margins of imaging without gross abnormality of the included
hardware.
IMPRESSION: No acute cardiopulmonary abnormality.

## 2020-10-21 MED ORDER — GABAPENTIN 300 MG PO CAPS
300.0000 mg | ORAL_CAPSULE | Freq: Every day | ORAL | Status: DC
  Administered 2020-10-22 – 2020-11-06 (×16): 300 mg via ORAL
  Filled 2020-10-21 (×16): qty 1

## 2020-10-21 MED ORDER — GUAIFENESIN ER 600 MG PO TB12: 600.0000 mg | ORAL_TABLET | Freq: Two times a day (BID) | ORAL | Status: DC | PRN

## 2020-10-21 NOTE — Progress Notes (Addendum)
Patient sitting in chair and looks anxious and tremulous.  Nurse in room reported that patient had  SOB. Vitals stable except for elevated BP and no DOE or SOB noted--question panic attack. Lungs are clear. Patient reports that she can "feel herself swelling" and legs feel tight. Discussed neuropathy and that she has missed 3 doses of gabapentin. Paraplegia contributing to LE edema. She does report that she had swelling in the past with higher doses.  Will decrease to bedtime to improve compliance and also monitor for improvement in edema. Will order daily weights and CXR for work up.   Informed later that patient's friend passed away today and could be contributing to current mood/distress.

## 2020-10-21 NOTE — Patient Care Conference (Signed)
Inpatient RehabilitationTeam Conference and Plan of Care Update Date: 10/21/2020   Time: 11:00 AM    Patient Name: Mary Davila      Medical Record Number: [id number]  Date of Birth: 1956/03/12 Sex: Female         Room/Bed: 4M11C/4M11C-01 Payor Info: Payor: [employer] MEDICARE / Plan: [employer] MEDICARE HMO / Product Type: *No Product type* /    Admit Date/Time:  10/07/2020  3:51 PM  Primary Diagnosis:  Acute incomplete paraplegia [facility])  Hospital Problems: Principal Problem:   Acute incomplete paraplegia (HCC) Active Problems:   Lumbar disc herniation with myelopathy    Expected Discharge Date: Expected Discharge Date: 11/04/20  Team Members Present: Physician leading conference: Dr. Genice Rouge Care Coodinator Present: Cecile Sheerer, LCSWA;Hendrik Donath Marlyne Beards, RN, BSN, CRRN Nurse Present: Jesusita Oka, LPN PT Present: Peter Congo, PT OT Present: Ardis Rowan, COTA;Jennifer Katrinka Blazing, OT PPS Coordinator present : Edson Snowball, Park Breed, SLP     Current Status/Progress Goal Weekly Team Focus  Bowel/Bladder   continent of b/b, pvr 3x daily; LBM: 01/31, scheduled bowel program  remain continent of b/b  assist with toileting needs prn   Swallow/Nutrition/ Hydration             ADL's   bathing shower or bed level-min A; LB dressing-max A; toileting-max A; SB transfers-min A level surface; squat pivot tranfsers-CGA level surface;  supervision overall w/c level  tranfsers, LB ADLs, toileting, education   Mobility   mod A bed mobility, min A to stand to stedy, min A SB transfer vs CGA squat pivot, Supervision w/c mobility  Supervision overall at w/c level  transfers, standing as able, w/c mobility and management, LE NMR, balance, SCI edu   Communication             Safety/Cognition/ Behavioral Observations            Pain   c/o to bil LE, generalized  pain level <4/10  assess pain level QS and prn   Skin   back incision healed  reamin free of new skin breakdown/infection   assess skin QS and prn     Discharge Planning:  Pt to have 24/7 care between her dtr and sister. TBD if pt will d/c to her home or her sister's home. Will need to confirm if a ramp is in process for pt if going to her sister's home.   Team Discussion: Hold bowel program for tonight. Blood pressure doing better. Stopped Midodrine and Flomax, Mucinex changed to PRN. Gave PRN Tylenol this morning, would like to take Gabapentin at night only because it makes her drowsy. Continent B/B. PVR TID. Going to sister home at [facility] level. Patient on target to meet rehab goals: Mod assist with bed mobility, squat pivot transfers. Scheduled a [employer] W/C eval. Min assist for shower or bed level bathing and dressing. Can get dressed at EOB. Tomorrow plan to use the tub bench for bathing.   *See Care Plan and progress notes for long and short-term goals.   Revisions to Treatment Plan:  Holding bowel program tonight, changing Gabapentin to QHS, Mucinex now PRN  Teaching Needs: Family education, medication management, bowel program, transfer training, pain management  Current Barriers to Discharge: Inaccessible home environment, Decreased caregiver support, Home enviroment access/layout, Neurogenic bowel and bladder, Lack of/limited family support, Weight bearing restrictions, Medication compliance and Behavior  Possible Resolutions to Barriers: Continue current medications, offer encouragement to patient, provide emotional support to patient and family.     Medical  Summary Current Status: pain manageable with tylenol; no wounds- more continent of B/B; good BM on toilet this AM-  Barriers to Discharge: Decreased family/caregiver support;Home enviroment access/layout;Neurogenic Bowel & Bladder;Weight bearing restrictions  Barriers to Discharge Comments: go to sister's; needed ramp- will hold bowel program for now since doing well; might need to teach it; made mucinex prn; will change gabapentin to QHS Possible  Resolutions to Barriers/Weekly Focus: mod A Bed; squat pivot CGA; w/c eval next week- [employer]; bath min A; LB ADLS max A; works hard; bowel program? as above; d/c 2/15   Continued Need for Acute Rehabilitation Level of Care: The patient requires daily medical management by a physician with specialized training in physical medicine and rehabilitation for the following reasons: Direction of a multidisciplinary physical rehabilitation program to maximize functional independence : Yes Medical management of patient stability for increased activity during participation in an intensive rehabilitation regime.: Yes Analysis of laboratory values and/or radiology reports with any subsequent need for medication adjustment and/or medical intervention. : Yes   I attest that I was present, lead the team conference, and concur with the assessment and plan of the team.   Tennis Must 10/21/2020, 2:47 PM

## 2020-10-21 NOTE — Progress Notes (Signed)
[facility] PHYSICAL MEDICINE & REHABILITATION PROGRESS NOTE   Subjective/Complaints:  Pt reports still has catch in back- not sure what it's from.  Still voiding OK- since Flomax stopped.  Thinks she still needs bowel program, because only has small "plop-plops" when not getting bowel program done the rest of day- so thinks it empties her out.   Wants to stop Mucinex- doesn't know why she's getting it?  ROS: Pt denies SOB, abd pain, CP, N/V/C/D, and vision changes   Objective:   VAS US LOWER EXTREMITY VENOUS (DVT)  Result Date: 10/20/2020  Lower Venous DVT Study Indications: Swelling, and Edema.  Comparison Study: 10/08/20 previous Performing Technologist: Blanch MediaMegan Riddle RVS  Examination Guidelines: A complete evaluation includes B-mode imaging, spectral Doppler, color Doppler, and power Doppler as needed of all accessible portions of each vessel. Bilateral testing is considered an integral part of a complete examination. Limited examinations for reoccurring indications may be performed as noted. The reflux portion of the exam is performed with the patient in reverse Trendelenburg.  +---------+---------------+---------+-----------+----------+--------------+ RIGHT    CompressibilityPhasicitySpontaneityPropertiesThrombus Aging +---------+---------------+---------+-----------+----------+--------------+ CFV      Full           Yes      Yes                                 +---------+---------------+---------+-----------+----------+--------------+ SFJ      Full                                                        +---------+---------------+---------+-----------+----------+--------------+ FV Prox  Full                                                        +---------+---------------+---------+-----------+----------+--------------+ FV Mid   Full                                                         +---------+---------------+---------+-----------+----------+--------------+ FV DistalFull                                                        +---------+---------------+---------+-----------+----------+--------------+ PFV      Full                                                        +---------+---------------+---------+-----------+----------+--------------+ POP      Full           Yes      Yes                                 +---------+---------------+---------+-----------+----------+--------------+  PTV      Full                                                        +---------+---------------+---------+-----------+----------+--------------+ PERO     Full                                                        +---------+---------------+---------+-----------+----------+--------------+   +---------+---------------+---------+-----------+----------+--------------+ LEFT     CompressibilityPhasicitySpontaneityPropertiesThrombus Aging +---------+---------------+---------+-----------+----------+--------------+ CFV      Full           Yes      Yes                                 +---------+---------------+---------+-----------+----------+--------------+ SFJ      Full                                                        +---------+---------------+---------+-----------+----------+--------------+ FV Prox  Full                                                        +---------+---------------+---------+-----------+----------+--------------+ FV Mid   Full                                                        +---------+---------------+---------+-----------+----------+--------------+ FV DistalFull                                                        +---------+---------------+---------+-----------+----------+--------------+ PFV      Full                                                         +---------+---------------+---------+-----------+----------+--------------+ POP      Full           Yes      Yes                                 +---------+---------------+---------+-----------+----------+--------------+ PTV      Full                                                        +---------+---------------+---------+-----------+----------+--------------+  PERO     Full                                                        +---------+---------------+---------+-----------+----------+--------------+     Summary: BILATERAL: - No evidence of deep vein thrombosis seen in the lower extremities, bilaterally. - No evidence of superficial venous thrombosis in the lower extremities, bilaterally. -No evidence of popliteal cyst, bilaterally.   *See table(s) above for measurements and observations. Electronically signed by Sherald Hess MD on 10/20/2020 at 4:02:22 PM.    Final    Recent Labs    10/20/20 0500  WBC 4.9  HGB 9.5*  HCT 28.3*  PLT 408*   Recent Labs    10/21/20 0456  NA 143  K 4.5  CL 107  CO2 26  GLUCOSE 93  BUN 17  CREATININE 0.94  CALCIUM 9.7    Intake/Output Summary (Last 24 hours) at 10/21/2020 0857 Last data filed at 10/21/2020 0800 Gross per 24 hour  Intake 360 ml  Output --  Net 360 ml        Physical Exam: Vital Signs Blood pressure (!) 116/92, pulse 91, temperature 98.7 F (37.1 C), resp. rate 16, height 5' 5.5" (1.664 m), weight 72.9 kg, SpO2 98 %.  General: sitting up in bedside chair, appropriate, finished breakfast, NAD Mood and affect are more interactive, still slightly flat, but doing much better Heart: RRR- no JVD Pulm: CTA B/L- no W/R/R- good air movement Abdomen:Soft, NT, ND, (+)BS  Extremities: No clubbing, cyanosis, or edema  Musculoskeletal: 1-2+ LE foot/ankle swelling B/L    Comments: UE's 5/5 in deltoid, biceps, triceps, WE grip and finger abd LE's RLE- HF 2-/5, KE 3+/5, DF and PF 0/5- unchanged, esp in DF and  PF LLE- HF 1/5, KE 3+/5, DF and PF 0/5 - unchanged esp in DF and PF Skin: back dressing in place - honeycomb- no drainage on dressing C/D/I- not assessed today Neurological: Ox3- but slowed processing   B/L foot drop as above - no change Psychiatric: much calmer  Assessment/Plan: 1. Functional deficits which require 3+ hours per day of interdisciplinary therapy in a comprehensive inpatient rehab setting.  Physiatrist is providing close team supervision and 24 hour management of active medical problems listed below.  Physiatrist and rehab team continue to assess barriers to discharge/monitor patient progress toward functional and medical goals  Care Tool:  Bathing    Body parts bathed by patient: Right arm,Left arm,Chest,Abdomen,Front perineal area,Right upper leg,Left upper leg,Face   Body parts bathed by helper: Buttocks,Right lower leg,Left lower leg Body parts n/a: Right lower leg,Left lower leg   Bathing assist Assist Level: Moderate Assistance - Patient 50 - 74%     Upper Body Dressing/Undressing Upper body dressing Upper body dressing/undressing activity did not occur (including orthotics): Safety/medical concerns What is the patient wearing?: Pull over shirt    Upper body assist Assist Level: Supervision/Verbal cueing    Lower Body Dressing/Undressing Lower body dressing      What is the patient wearing?: Pants,Incontinence brief     Lower body assist Assist for lower body dressing: Maximal Assistance - Patient 25 - 49%     Toileting Toileting Toileting Activity did not occur (Clothing management and hygiene only): N/A (no void or bm)  Toileting assist Assist for toileting: Maximal Assistance -  Patient 25 - 49%     Transfers Chair/bed transfer  Transfers assist  Chair/bed transfer activity did not occur: Safety/medical concerns  Chair/bed transfer assist level: Contact Guard/Touching assist (squat pivot) Chair/bed transfer assistive device:  (squat  pivot)   Locomotion Ambulation   Ambulation assist   Ambulation activity did not occur: Safety/medical concerns          Walk 10 feet activity   Assist  Walk 10 feet activity did not occur: Safety/medical concerns        Walk 50 feet activity   Assist Walk 50 feet with 2 turns activity did not occur: Safety/medical concerns         Walk 150 feet activity   Assist Walk 150 feet activity did not occur: Safety/medical concerns         Walk 10 feet on uneven surface  activity   Assist Walk 10 feet on uneven surfaces activity did not occur: Safety/medical concerns         Wheelchair     Assist Will patient use wheelchair at discharge?: Yes Type of Wheelchair: Manual    Wheelchair assist level: Supervision/Verbal cueing Max wheelchair distance: 150'    Wheelchair 50 feet with 2 turns activity    Assist        Assist Level: Supervision/Verbal cueing   Wheelchair 150 feet activity     Assist      Assist Level: Supervision/Verbal cueing   Blood pressure (!) 116/92, pulse 91, temperature 98.7 F (37.1 C), resp. rate 16, height 5' 5.5" (1.664 m), weight 72.9 kg, SpO2 98 %.  Medical Problem List and Plan: 1.  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  Antithrombotics: -DVT/anticoagulation:  Pharmaceutical: Heparin 1/20- changed to Lovenox 1/31- will check Dopplers B/L since has acute onset LE edema in setting of SCI pt.  2/1- Dopplers (-) for DVT- con't regimen              -antiplatelet therapy: N/a 3. Pain Management: Currently using hydrocodone with flexeril prn- changed from robaxin, since wasn't working   1/20- pt reports pain meds make her hurt "worse"-  refusing vicodin and "allergic to tramadol" and refused cymbalta- says gabapentin causes swelling- will con't tylenol prn- cannot do excedrin due to bleeding risk  1/21- encouraged pt to continue taking keppra since I don't think it's the cause of increased pain- I think it's meds wearing off- that's more likely- explained to pt.   1/23- will try to increase Keppra to 500 mg BID for nerve pain- cont' tylenol since pt refuses everything else.   1/31- pain more managebale, con't regimen 4. Mood: LCSW to follow for evaluation and support.              -antipsychotic agents:  N/A 5. Neuropsych: This patient is capable of making decisions on her own behalf. 6. Skin/Wound Care: Monitor wound for healing.  7. Fluids/Electrolytes/Nutrition: Monitor I/O. Check lytes in am.  8.  HTN: Monitor BP tid--having HYPOTENSION per therapy notes and pt- will stop Norvasc 5 mg daily and see if does ok- might need midodrine for hypotension  1/19- BP very slightly high- due to off Norvasc- but hopefully will help orthostatic hypotension- will monitor  1/20- will restart Norvasc 2.5 mg daily since BP 156/91 this AM  1/21- BP 120/73 this AM- better with low dose Norvasc  1/22- BP 120s/but need to start Flomax- so will start Midodrine 2.5 mg TID with meals, so leaves room in BP for flomax to lower BP.   1/28- BP much better- 110s-120s/70s- con't low dose Norvasc and low dose Midrodrine, since when stopped Norvasc, BP went up to 160-170s Systolic  1/31- BP controlled- they stopped Midodrine this weekend since off Flomax- is having LE edema- pt doesn't want to stop.  Vitals:   10/20/20 2018 10/21/20 0326  BP:  (!) 116/92  Pulse:  91  Resp:  16  Temp:  98.7 F (37.1 C)  SpO2: 98% 98%  .  2/1- BP 116/92- tolerating current BP- Norvasc and LE weakness likely main reason for LE edema- since Dopplers (-) for DVT- con't regimen 9. Hypokalemia: Preadmission labs showed K+2.9. ON Kdur 40 meq bid--check follow up labs today.  Fluids d/c yesterday.   1/27- K+ 4.1- doing well- con't regimen  2/1- K+ 4.5- con't regimen 10. Mild anemia: Recheck CBC for post op labs--likely has ABLA.   11.  COPD: Respiratory status stable on Dulera bid. Albuterol prn SOB.  12. Neurogenic bladder:  Will d/c foley  In a couple of days- right now cannot tolerate Flomax due to low BP issues- will give it a few days, since having to start to deal with bowel program today- will remove Friday vs Monday-   1/19- foley removed  1/22- will start Flomax- per above about Midodrine so can tolerate Flomax.   1/23- started flomax- will take days if will help.  1/25- pt to learn to cath- needs mirror  1/26- pt voided some- will see if able to void- voiding "some" but pushing- hard to tell if pushing too hard.   1/27- pt voiding some on toilet- as well as having small BMs- con't Flomax  1/28- hasn't required in/out caths for 24 hours- con't regimen C/o freq voids q1h, will stop flomax ( and midodrine) check PVRs  1/31 since flomax effect can reduce over time- will con't to check PVRs- will check for 3 days- I'm concerned that pt will stop being able to void off Flomax, but will see.  2/1- still voiding well per pt- emptying per chart- con't regimen  ? Bladder spasms vs , ZPP dysesthesia 13. Neurogenic bowel: Has had constipation for past few months (goes once a week). She had multiple BMs (hard balls -->liquid) yesterday after mag citrate. Will order KUB to check stool burden.   Will continue Senna S with miralax daily. Schedule suppository after supper.     - will make sure we start bowel program this evening- discussed at length with pt about bowel program.  1/22- no BM with bowel program- had accident this AM  1/23- pt says had Bowel program- not documented in chart  1/25- bowel program went well- con't regimen  1/26- had results on toilet- working better  1/27- results on bowel program last night AND results on toilet overnight.   1/28- has had BM  on toilet- hasn't needed bowel program  2/1- pt feels she still needs dig stim/suppository since helps her empty completely- will have pt learn to do bowel program 14. At level SCI pain- starting Cymbalta 20 mg QHS and will titrate up. Already on gabapentin but  not working so far.  1/20- changed to keppra 250 mg BID for nerve pain  1/21- pt doesn't want to take- I asked her oto continue.  15. Low grade fever this AM  1/19- WBC normal- no left shift seen- get U/A and Cx- and monitor- if U/A (+), will start PO ABX.   1/20- U/A (-)  1/22- no more fevers  1/27- rechecked U/A on 1/25- (+) but had 30k Ecoli- sensitive to everything- Sx's improving- wait on starting ABX since not 100k EColi.  16. Severe depression  1/22- has refused anti-depressants so far-  1/23- discussed again- will start Paxil 10 mg QHS to try and avoid side effects.   Pt states this med makes her tired  1/25- con't Paxil if possible.   1/26- still taking per chart- brighter affect  1/28- bright affect- handling much better  17 peripheral edema, r/t weakness in LEs rec elevation in recliner during the day today (no therapy) and knee hi teds   1/31- will check Dopplers LE B/L due to continued LE swelling.  2/1- Dopplers (-)- likely due ot Norvasc AND LE weakness combined to cause swelling- needs TEDs.     LOS: 14 days A FACE TO FACE EVALUATION WAS PERFORMED  Lillieanna Tuohy 10/21/2020, 8:57 AM

## 2020-10-21 NOTE — Progress Notes (Signed)
Physical Therapy Session Note  Patient Details  Name: Mary Davila MRN: [id number] Date of Birth: [birth year]  Today's Date: 10/21/2020 PT Individual Time:1000-1100, 1400-1500 PT Individual Time Calculation (min): 60 min, 60 min  Short Term Goals: Week 2:  PT Short Term Goal 1 (Week 2): Pt will perform bed mobility with min A consistently PT Short Term Goal 2 (Week 2): Pt will perform least restrictive transfer with min A consistently PT Short Term Goal 3 (Week 2): Pt will recall and be able to perform 2 pressure relief techniques  Skilled Therapeutic Interventions/Progress Updates:   AM session: Pt seated in wheelchair on arrival and agreeable to PT session. Pt propelled wc 2 x 300 ft with supervision. This session focused on improving confidence with reaching outside BOS to improve safety during squat pivot transfer. Squat pivot to mat table with CGA, played giant connect 4, reaching to grab tokens and place in game x10 minutes with close supervision to CGA at times for sitting balance. SPT positioned wc as if to perform squat pivot transfer, pt performs reaches to arm rest x10 and leans to armrest x20 to each side. Pt able to perform squat with UE support on mat and wc arm rest 3x5 before transferring to wc with CGA. Pt requests to use bathroom at end of session. Stedy transfer to elevated BSC over toilet with min A to stand from w/c seat height. Pt left seated on toilet in bathroom with call button in reach, NT notified of pt's location.  PM session: Pt seated in wc with legs elevated on chair on arrival and agreeable to PT. Pt propelled wc x200 ft with supervision. CGA to squat pivot to mat table. Pt performed tricep push ups with push up blocks 3 x 10 to improve UE strength for functional mobility. Performed ball toss x10 min with 1KG med ball, and zoom ball x3 min to improve dynamic sitting balance. Pt performed wc propulsion x 400 ft with 2 bouts of weaving through cones to improve endurance  and obstacle navigation. Pt requested to use bathroom at end of session. Stedy transfer to elevated BSC over toilet with min A to stand from wc. Pt left seated on toilet in bathroom with call button in reach.   Therapy Documentation Precautions:  Precautions Precautions: Back,Fall Precaution Comments: Reviewed back precautions Required Braces or Orthoses:  (no brace needed) Other Brace: no brace needed per chart/orders Restrictions Weight Bearing Restrictions: No   Therapy/Group: Individual Therapy  [facility]. SPT 10/21/2020, 5:18 PM

## 2020-10-21 NOTE — Progress Notes (Addendum)
Patient ID: Mary Davila, female   DOB: 03/28/1956, 65 y.o.   MRN: [id number]  SW met with pt in room to provide updates from team conference on gains made in rehab, and d/c date remains 2/15. When discussing pt d/c location, pt reports she will be discharging to her home since she lives in a ground level apartment and will have no need for a ramp. Pt aware she will have w/c evaluation next week with [employer]. She states she and her daughter will rotate providing care until her daughter's FMLA is in place. SW discussed with pt speaking with family about scheduling family education. SW to follow-up on dates for next week.   SW left message for [name] [phone])  to discuss above. SW waiting on follow-up.  *SW received return phone call from [name]. SW provided above updates. She will follow-up with SW about family education times after she speaks with [name].  Loralee Pacas, MSW, [place] Office: [phone] Cell: [phone] Fax: [phone]

## 2020-10-21 NOTE — Progress Notes (Signed)
Occupational Therapy Session Note  Patient Details  Name: Mary Davila MRN: [id number] Date of Birth: 27-May-1956  Today's Date: 10/21/2020 OT Individual Time: 5400-8676 OT Individual Time Calculation (min): 70 min    Short Term Goals: Week 2:  OT Short Term Goal 1 (Week 2): Pt will perform drop arm BSC tranfser using SB with min a OT Short Term Goal 2 (Week 2): Pt will don pants at bed level with min A using AE PRN OT Short Term Goal 3 (Week 2): Pt will perform LB bahting at bed level using AE PRN OT Short Term Goal 4 (Week 2): Pt will perform toileting tasks seated on BSC with mod A for clothing management  Skilled Therapeutic Interventions/Progress Updates:    OT intervention with focus on bathing at shower level, sit<>stand in [place], dressing with sit<>stand from w/c, w/c mobility, and DME recommendations. Bathing with min A seated on TTB. Dressing with assistance to don Ted hose and shoes. Assistance to pull pants over hips when standing in [place]. Pt able to thread pants using reacher. Discussed use of TTB and drop arm BSC. Pt remained seated in w/c with all needs within reach.   Therapy Documentation Precautions:  Precautions Precautions: Back,Fall Precaution Comments: Reviewed back precautions Required Braces or Orthoses:  (no brace needed) Other Brace: no brace needed per chart/orders Restrictions Weight Bearing Restrictions: No  Pain:  Pt denies pain this morning   Therapy/Group: Individual Therapy  Rich Brave 10/21/2020, 9:28 AM

## 2020-10-22 MED ORDER — B COMPLEX-C PO TABS
1.0000 | ORAL_TABLET | Freq: Every day | ORAL | Status: DC
  Administered 2020-10-22 – 2020-11-07 (×17): 1 via ORAL
  Filled 2020-10-22 (×17): qty 1

## 2020-10-22 MED ORDER — SENNOSIDES-DOCUSATE SODIUM 8.6-50 MG PO TABS: 2.0000 | ORAL_TABLET | Freq: Every evening | ORAL | Status: DC | PRN

## 2020-10-22 NOTE — Progress Notes (Signed)
Recreational Therapy Session Note  Patient Details  Name: Mary Davila MRN: [id number] Date of Birth: Jun 20, 1956 Today's Date: 10/22/2020  Pain: no c/o Skilled Therapeutic Interventions/Progress Updates: session focused on activity tolerance, slide board transfers, discharge planning.  LRT also discussed vehicle modifications that could be made to allow pt to drive in the future as pt stated this was a concern to PTA previously this morning.  Also provided the name of a local vendor that could assist with this in the future if she was interested.  Pt appreciative of information.  Therapy/Group: Co-Treatment [name] 10/22/2020, 2:54 PM

## 2020-10-22 NOTE — Progress Notes (Signed)
Physical Therapy Weekly Progress Note  Patient Details  Name: Mary Davila MRN: [id number] Date of Birth: 08/24/1956  Beginning of progress report period: October 15, 2020 End of progress report period: October 22, 2020  Today's Date: 10/22/2020 PT Individual Time: 1415-1450 PT Individual Time Calculation (min): 35 min   Patient has met 2 of 3 short term goals.  Pt currently min to mod with bed mobility. Introduced leg loops for increased independence with bed mobility and transfers. Pt consistently CGA for squat pivot to level surfaces. Pt supervision with propelling wc 150 (+) ft including navigating community environments and managing wheelchair parts. Pt able to recall and perform pressure relief techniques.   Patient continues to demonstrate the following deficits muscle weakness and muscle paralysis, decreased cardiorespiratoy endurance, unbalanced muscle activation and decreased sitting balance and therefore will continue to benefit from skilled PT intervention to increase functional independence with mobility.  Patient progressing toward long term goals..  Continue plan of care.  PT Short Term Goals Week 2:  PT Short Term Goal 1 (Week 2): Pt will perform bed mobility with min A consistently PT Short Term Goal 1 - Progress (Week 2): Progressing toward goal PT Short Term Goal 2 (Week 2): Pt will perform least restrictive transfer with min A consistently PT Short Term Goal 2 - Progress (Week 2): Met PT Short Term Goal 3 (Week 2): Pt will recall and be able to perform 2 pressure relief techniques PT Short Term Goal 3 - Progress (Week 2): Met Week 3:  PT Short Term Goal 1 (Week 3): Pt will perform bed mobility with min A consistently PT Short Term Goal 2 (Week 3): Pt will be independent with adaptive equipment for bed mobility and transfers. PT Short Term Goal 3 (Week 3): Pt will maintain dynamic sitting balance with close supervision x5 minutes.  Skilled Therapeutic  Interventions/Progress Updates:  Pt seated in wc upon arrival and agreeable to PT session. Pt transported to gym due to fatigue. Performed squat pivot transfer with CGA to mat table. Pt introduced to leg loops to improve independence with bed mobility. Pt issued one set of leg loops, will provide another when they are off backorder. Discussed strategies and educated pt in proper use of leg loops. Pt able to perform bed mobility (sit<>lying x 2) on mat table with leg loops and leg lifter for other leg with supervision. Pt returned to room and remained in wc with all needs in reach.  Therapy Documentation Precautions:  Precautions Precautions: Back,Fall Precaution Comments: Reviewed back precautions Required Braces or Orthoses:  (no brace needed) Other Brace: no brace needed per chart/orders Restrictions Weight Bearing Restrictions: No   Therapy/Group: Individual Therapy  Olivia Hill, SPT 10/22/2020, 5:36 PM  

## 2020-10-22 NOTE — Progress Notes (Signed)
Occupational Therapy Session Note  Patient Details  Name: Mary Davila MRN: [id number] Date of Birth: [birth year]  Today's Date: 10/22/2020 OT Individual Time: 0800-0900 OT Individual Time Calculation (min): 60 min    Short Term Goals: Week 2:  OT Short Term Goal 1 (Week 2): Pt will perform drop arm BSC tranfser using SB with min a OT Short Term Goal 2 (Week 2): Pt will don pants at bed level with min A using AE PRN OT Short Term Goal 3 (Week 2): Pt will perform LB bahting at bed level using AE PRN OT Short Term Goal 4 (Week 2): Pt will perform toileting tasks seated on BSC with mod A for clothing management  Skilled Therapeutic Interventions/Progress Updates:    OT intervention with focus on toileting, LB dressing, w/c mobility, and sitting balance to increase independence with BADLs. Toilet transfers with [place]. Pt requires tot A for toileting. Pt threads BLE into pants and pulls up to knees. Sit<>stand in [place] with min A and pt initiated pulling pants over hips. Pt dependent for donning ted hose and shoes. Pt uses shoe funnel to assist with pushing heel into shoe. Pt propelled to gym. Squat pivot transfer to EOM (elevated) with CGA. Sitting balance activities with large and small ball-chest passes and bounce passes. Pt returned to room and remained in w/c with all needs within reach.   Therapy Documentation Precautions:  Precautions Precautions: Back,Fall Precaution Comments: Reviewed back precautions Required Braces or Orthoses:  (no brace needed) Other Brace: no brace needed per chart/orders Restrictions Weight Bearing Restrictions: No   Pain:  Pt denies pain this morning   Therapy/Group: Individual Therapy  Rich Brave 10/22/2020, 12:22 PM

## 2020-10-22 NOTE — Progress Notes (Signed)
Physical Therapy Session Note  Patient Details  Name: Mary Davila MRN: [id number] Date of Birth: [birth year]  Today's Date: 10/22/2020 PT Individual Time: 0915-1015 PT Individual Time Calculation (min): 60 min   Short Term Goals: Week 2:  PT Short Term Goal 1 (Week 2): Pt will perform bed mobility with min A consistently PT Short Term Goal 2 (Week 2): Pt will perform least restrictive transfer with min A consistently PT Short Term Goal 3 (Week 2): Pt will recall and be able to perform 2 pressure relief techniques  Skilled Therapeutic Interventions/Progress Updates: Pt presented in w/c agreeable to therapy, pt denies pain during session. Session focused on w/c mobilty in community setting as well as w/c propulsion for general endurance/conditioning. Pt propelled w/c to elevators and was able to propel into elevators without assist. Pt did require minA as she was unable to garner enough momentum to propel out of elevators. Pt then propelled to gift shop and propelled throughout shop navigating small spaces and made good jugdement calls when space was too small for w/c. Pt then propelled throughout atrium taking intermittent breaks due to BUE fatigue. PTA discussed with pt use of w/c for community re-integration and expressing value of trying to do things/outings that she enjoyed prior. Pt did express that she misses driving and educated that adaptations can be made to vehicles (also passed info to [place], rec therapist). Pt propelled back elevators and was able to propel onto elevator in same manner as prior. Pt propelled backwards to get out of elevator with supervision and PTA monitoring for people. Pt then transported to rehab gym for energy conservation and performed squat pivot transfer to mat with CGA. Pt was able to manage w/c with supervision (locking breaks, removing arm rest, and managing leg rests). Pt then indicated possible need for toilet. Performed squat pivot back to w/c with CGA and pt  propelled back to room. Performed Stedy transfer to toilet once in room and transferred to toilet. Pt left seated at toilet with call bell within reach and current needs met.      Therapy Documentation Precautions:  Precautions Precautions: Back,Fall Precaution Comments: Reviewed back precautions Required Braces or Orthoses:  (no brace needed) Other Brace: no brace needed per chart/orders Restrictions Weight Bearing Restrictions: No General:   Vital Signs: Therapy Vitals Temp: 98.3 F (36.8 C) Temp Source: Oral Pulse Rate: 85 Resp: 16 BP: (!) 151/84 Patient Position (if appropriate): Sitting Oxygen Therapy SpO2: 100 % O2 Device: Room Air  Therapy/Group: Individual Therapy  Charmagne Buhl  Arash Karstens, PTA  10/22/2020, 3:42 PM

## 2020-10-22 NOTE — Progress Notes (Signed)
Patient ID: Mary Davila, female   DOB: 10-24-1955, 65 y.o.   MRN: [id number]  SW sent H&P and demo sheet to Harrie Jeans with [employer] for upcoming w/c eval on 2/8.  Cecile Sheerer, MSW, LCSWA Office: [phone] Cell: [phone] Fax: [phone]

## 2020-10-22 NOTE — Progress Notes (Signed)
Occupational Therapy Session Note  Patient Details  Name: Mary Davila MRN: [id number] Date of Birth: 06-24-1956  Today's Date: 10/22/2020 OT Individual Time: 1100-1155 OT Individual Time Calculation (min): 55 min    Short Term Goals: Week 2:  OT Short Term Goal 1 (Week 2): Pt will perform drop arm BSC tranfser using SB with min a OT Short Term Goal 2 (Week 2): Pt will don pants at bed level with min A using AE PRN OT Short Term Goal 3 (Week 2): Pt will perform LB bahting at bed level using AE PRN OT Short Term Goal 4 (Week 2): Pt will perform toileting tasks seated on BSC with mod A for clothing management  Skilled Therapeutic Interventions/Progress Updates:    Cotreatment with Recreational Therapist. Focus on w/c mobility, discharge planning, TTB SB tranfsers, and drop arm BSC SB transfers. Discussed bathroom layout and w/c accessibility. Pt will be able to access bathroom with w/c. TTB transfer with min A and mod verbal cues for technique. BSC transfers with min A. Pt pleased with her performance in these tasks. Recommended that another person be present to steady equipment/assist. Pt verbalzied understanding. Pt returned to room and transferred to toilet with Stedy. Pt remained on BSC. NT notified.   Therapy Documentation Precautions:  Precautions Precautions: Back,Fall Precaution Comments: Reviewed back precautions Required Braces or Orthoses:  (no brace needed) Other Brace: no brace needed per chart/orders Restrictions Weight Bearing Restrictions: No   Pain:  Pt denies pain    Therapy/Group: Individual Therapy  Rich Brave 10/22/2020, 12:14 PM

## 2020-10-22 NOTE — Progress Notes (Signed)
[facility] PHYSICAL MEDICINE & REHABILITATION PROGRESS NOTE   Subjective/Complaints:  Pt says bowels working well, but now insists to stop Senokot- even though going well- trying to "get rid of as many meds as possible".   Friend died yesterday- also pt c/o jitteriness- thinks due to meds.  Very upset that legs swelling- couldn't get shoes on last afternoon- however swelling this Am is trace.   Appears a little irritable/anxious this AM ROS:  Pt denies SOB, abd pain, CP, N/V/C/D, and vision changes    Objective:   DG Chest 2 View  Result Date: 10/21/2020 CLINICAL DATA:  Swelling of legs for 2 days, shortness of breath, recent spine surgery EXAM: CHEST - 2 VIEW COMPARISON:  None. FINDINGS: No consolidation, features of edema, pneumothorax, or effusion. Pulmonary vascularity is normally distributed. The cardiomediastinal contours are unremarkable. No acute osseous or soft tissue abnormality. Degenerative changes are present in the imaged spine and shoulders. Evidence of calcific tendinosis in the right shoulder. Lumbar fusion hardware is partially visualized within the margins of imaging without gross abnormality of the included hardware. IMPRESSION: No acute cardiopulmonary abnormality. Electronically Signed   By: Kreg Shropshire M.D.   On: 10/21/2020 19:26   VAS [place] LOWER EXTREMITY VENOUS (DVT)  Result Date: 10/20/2020  Lower Venous DVT Study Indications: Swelling, and Edema.  Comparison Study: 10/08/20 previous Performing Technologist: Blanch Media RVS  Examination Guidelines: A complete evaluation includes B-mode imaging, spectral Doppler, color Doppler, and power Doppler as needed of all accessible portions of each vessel. Bilateral testing is considered an integral part of a complete examination. Limited examinations for reoccurring indications may be performed as noted. The reflux portion of the exam is performed with the patient in reverse Trendelenburg.   +---------+---------------+---------+-----------+----------+--------------+ RIGHT    CompressibilityPhasicitySpontaneityPropertiesThrombus Aging +---------+---------------+---------+-----------+----------+--------------+ CFV      Full           Yes      Yes                                 +---------+---------------+---------+-----------+----------+--------------+ SFJ      Full                                                        +---------+---------------+---------+-----------+----------+--------------+ FV Prox  Full                                                        +---------+---------------+---------+-----------+----------+--------------+ FV Mid   Full                                                        +---------+---------------+---------+-----------+----------+--------------+ FV DistalFull                                                        +---------+---------------+---------+-----------+----------+--------------+  PFV      Full                                                        +---------+---------------+---------+-----------+----------+--------------+ POP      Full           Yes      Yes                                 +---------+---------------+---------+-----------+----------+--------------+ PTV      Full                                                        +---------+---------------+---------+-----------+----------+--------------+ PERO     Full                                                        +---------+---------------+---------+-----------+----------+--------------+   +---------+---------------+---------+-----------+----------+--------------+ LEFT     CompressibilityPhasicitySpontaneityPropertiesThrombus Aging +---------+---------------+---------+-----------+----------+--------------+ CFV      Full           Yes      Yes                                  +---------+---------------+---------+-----------+----------+--------------+ SFJ      Full                                                        +---------+---------------+---------+-----------+----------+--------------+ FV Prox  Full                                                        +---------+---------------+---------+-----------+----------+--------------+ FV Mid   Full                                                        +---------+---------------+---------+-----------+----------+--------------+ FV DistalFull                                                        +---------+---------------+---------+-----------+----------+--------------+ PFV      Full                                                        +---------+---------------+---------+-----------+----------+--------------+   POP      Full           Yes      Yes                                 +---------+---------------+---------+-----------+----------+--------------+ PTV      Full                                                        +---------+---------------+---------+-----------+----------+--------------+ PERO     Full                                                        +---------+---------------+---------+-----------+----------+--------------+     Summary: BILATERAL: - No evidence of deep vein thrombosis seen in the lower extremities, bilaterally. - No evidence of superficial venous thrombosis in the lower extremities, bilaterally. -No evidence of popliteal cyst, bilaterally.   *See table(s) above for measurements and observations. Electronically signed by Sherald Hess MD on 10/20/2020 at 4:02:22 PM.    Final    Recent Labs    10/20/20 0500  WBC 4.9  HGB 9.5*  HCT 28.3*  PLT 408*   Recent Labs    10/21/20 0456  NA 143  K 4.5  CL 107  CO2 26  GLUCOSE 93  BUN 17  CREATININE 0.94  CALCIUM 9.7    Intake/Output Summary (Last 24 hours) at 10/22/2020 0924 Last data  filed at 10/22/2020 0839 Gross per 24 hour  Intake 1080 ml  Output -  Net 1080 ml        Physical Exam: Vital Signs Blood pressure 135/87, pulse 88, temperature 98.4 F (36.9 C), temperature source Oral, resp. rate 18, height 5' 5.5" (1.664 m), weight 71.1 kg, SpO2 100 %.  General: sitting up in bedside chair- doesn't appear jittery, appears anxious, NAD Mood and affect anxious- don't see tremors Heart: RRR Pulm: CTA B/L- no W/R/R- good air movement Abdomen: Soft, NT, ND, (+)BS   Extremities: No clubbing, cyanosis, or edema Musculoskeletal:trace swelling L>R, but not significant this AM    Comments: UE's 5/5 in deltoid, biceps, triceps, WE grip and finger abd LE's RLE- HF 2-/5, KE 3+/5, DF and PF 0/5- unchanged, esp in DF and PF LLE- HF 1/5, KE 3+/5, DF and PF 0/5 - unchanged esp in DF and PF Skin: back dressing in place - honeycomb- no drainage on dressing C/D/I- not assessed today Trace to 1+ LE swelling -slightly more on LLE-  Neurological: Ox3- slowed processing-   B/L foot drop as above - no change  Assessment/Plan: 1. Functional deficits which require 3+ hours per day of interdisciplinary therapy in a comprehensive inpatient rehab setting.  Physiatrist is providing close team supervision and 24 hour management of active medical problems listed below.  Physiatrist and rehab team continue to assess barriers to discharge/monitor patient progress toward functional and medical goals  Care Tool:  Bathing    Body parts bathed by patient: Right arm,Left arm,Chest,Abdomen,Front perineal area,Right upper leg,Left upper leg,Face   Body parts bathed by helper: Buttocks,Right lower leg,Left lower leg Body parts n/a: Right lower  leg,Left lower leg   Bathing assist Assist Level: Minimal Assistance - Patient > 75%     Upper Body Dressing/Undressing Upper body dressing Upper body dressing/undressing activity did not occur (including orthotics): Safety/medical concerns What is  the patient wearing?: Pull over shirt    Upper body assist Assist Level: Supervision/Verbal cueing    Lower Body Dressing/Undressing Lower body dressing      What is the patient wearing?: Pants,Underwear/pull up     Lower body assist Assist for lower body dressing: Moderate Assistance - Patient 50 - 74%     Toileting Toileting Toileting Activity did not occur Press photographer and hygiene only): N/A (no void or bm)  Toileting assist Assist for toileting: Maximal Assistance - Patient 25 - 49%     Transfers Chair/bed transfer  Transfers assist  Chair/bed transfer activity did not occur: Safety/medical concerns  Chair/bed transfer assist level: Contact Guard/Touching assist Chair/bed transfer assistive device:  (squat pivot)   Locomotion Ambulation   Ambulation assist   Ambulation activity did not occur: Safety/medical concerns          Walk 10 feet activity   Assist  Walk 10 feet activity did not occur: Safety/medical concerns        Walk 50 feet activity   Assist Walk 50 feet with 2 turns activity did not occur: Safety/medical concerns         Walk 150 feet activity   Assist Walk 150 feet activity did not occur: Safety/medical concerns         Walk 10 feet on uneven surface  activity   Assist Walk 10 feet on uneven surfaces activity did not occur: Safety/medical concerns         Wheelchair     Assist Will patient use wheelchair at discharge?: Yes Type of Wheelchair: Manual    Wheelchair assist level: Supervision/Verbal cueing Max wheelchair distance: 300 ft    Wheelchair 50 feet with 2 turns activity    Assist        Assist Level: Supervision/Verbal cueing   Wheelchair 150 feet activity     Assist      Assist Level: Supervision/Verbal cueing   Blood pressure 135/87, pulse 88, temperature 98.4 F (36.9 C), temperature source Oral, resp. rate 18, height 5' 5.5" (1.664 m), weight 71.1 kg, SpO2 100  %.  Medical Problem List and Plan: 1.  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  Antithrombotics: -DVT/anticoagulation:  Pharmaceutical: Heparin 1/20- changed to Lovenox 1/31- will check Dopplers B/L since has acute onset LE edema in setting of SCI pt.  2/1- Dopplers (-) for DVT- con't regimen  2/2- will need to send home on Lovenox for a total of 3 months to prevent DVT/PE              -antiplatelet therapy: N/a 3. Pain Management: Currently using hydrocodone with flexeril prn- changed from robaxin, since wasn't working   1/20- pt reports pain meds make her hurt "worse"- refusing vicodin and "allergic to tramadol" and refused cymbalta- says gabapentin causes swelling- will con't tylenol prn- cannot do excedrin due to bleeding risk  1/21- encouraged  pt to continue taking keppra since I don't think it's the cause of increased pain- I think it's meds wearing off- that's more likely- explained to pt.   1/23- will try to increase Keppra to 500 mg BID for nerve pain- cont' tylenol since pt refuses everything else.   1/31- pain more managebale, con't regimen  2/2- says is scared Keppra cause of jitteriness- will start Vit B complex to help with mood- but think friend's death is more cause of Sx's. con't regimen 4. Mood: LCSW to follow for evaluation and support.              -antipsychotic agents:  N/A 5. Neuropsych: This patient is capable of making decisions on her own behalf. 6. Skin/Wound Care: Monitor wound for healing.  7. Fluids/Electrolytes/Nutrition: Monitor I/O. Check lytes in am.  8.  HTN: Monitor BP tid--having HYPOTENSION per therapy notes and pt- will stop Norvasc 5 mg daily and see if does ok- might need midodrine  for hypotension   1/19- BP very slightly high- due to off Norvasc- but hopefully will help orthostatic hypotension- will monitor  1/20- will restart Norvasc 2.5 mg daily since BP 156/91 this AM  1/21- BP 120/73 this AM- better with low dose Norvasc  1/22- BP 120s/but need to start Flomax- so will start Midodrine 2.5 mg TID with meals, so leaves room in BP for flomax to lower BP.   1/28- BP much better- 110s-120s/70s- con't low dose Norvasc and low dose Midrodrine, since when stopped Norvasc, BP went up to 160-170s Systolic  1/31- BP controlled- they stopped Midodrine this weekend since off Flomax- is having LE edema- pt doesn't want to stop.  Vitals:   10/21/20 2026 10/22/20 0315  BP: 127/79 135/87  Pulse: 75 88  Resp: 18 18  Temp: 98.3 F (36.8 C) 98.4 F (36.9 C)  SpO2: 100% 100%  .  2/1- BP 116/92- tolerating current BP- Norvasc and LE weakness likely main reason for LE edema- since Dopplers (-) for DVT- con't regimen 9. Hypokalemia: Preadmission labs showed K+2.9. ON Kdur 40 meq bid--check follow up labs today. Fluids d/c yesterday.   1/27- K+ 4.1- doing well- con't regimen  2/1- K+ 4.5- con't regimen 10. Mild anemia: Recheck CBC for post op labs--likely has ABLA.   11.  COPD: Respiratory status stable on Dulera bid. Albuterol prn SOB.  12. Neurogenic bladder:  Will d/c foley  In a couple of days- right now cannot tolerate Flomax due to low BP issues- will give it a few days, since having to start to deal with bowel program today- will remove Friday vs Monday-   1/19- foley removed  1/22- will start Flomax- per above about Midodrine so can tolerate Flomax.   1/23- started flomax- will take days if will help.  1/25- pt to learn to cath- needs mirror  1/26- pt voided some- will see if able to void- voiding "some" but pushing- hard to tell if pushing too hard.   1/27- pt voiding some on toilet- as well as having small BMs- con't Flomax  1/28- hasn't required in/out caths for 24 hours-  con't regimen C/o freq voids q1h, will stop flomax ( and midodrine) check PVRs  1/31 since flomax effect can reduce over time- will con't to check PVRs- will check for 3 days- I'm concerned that pt will stop being able to void off Flomax, but will see.  2/1- still voiding well per pt- emptying per chart- con't regimen  ? Bladder spasms  vs , ZPP dysesthesia 13. Neurogenic bowel: Has had constipation for past few months (goes once a week). She had multiple BMs (hard balls -->liquid) yesterday after mag citrate. Will order KUB to check stool burden.   Will continue Senna S with miralax daily. Schedule suppository after supper.     - will make sure we start bowel program this evening- discussed at length with pt about bowel program.  1/22- no BM with bowel program- had accident this AM  1/23- pt says had Bowel program- not documented in chart  1/25- bowel program went well- con't regimen  1/26- had results on toilet- working better  1/27- results on bowel program last night AND results on toilet overnight.   1/28- has had BM on toilet- hasn't needed bowel program  2/1- pt feels she still needs dig stim/suppository since helps her empty completely- will have pt learn to do bowel program  2/2- pt then had good BM on toilet- so doesn't think still needs bowel program - will see over next few days if needs- also, will make senokot prn since pt refuses to take daily - stools were going well- wants to reduce meds 14. At level SCI pain- starting Cymbalta 20 mg QHS and will titrate up. Already on gabapentin but not working so far.  1/20- changed to keppra 250 mg BID for nerve pain  1/21- pt doesn't want to take- I asked her oto continue.   2/2- changed to QHS per pt request 15. Low grade fever this AM  1/19- WBC normal- no left shift seen- get U/A and Cx- and monitor- if U/A (+), will start PO ABX.   1/20- U/A (-)  1/22- no more fevers  1/27- rechecked U/A on 1/25- (+) but had 30k Ecoli- sensitive to  everything- Sx's improving- wait on starting ABX since not 100k EColi.  16. Severe depression  1/22- has refused anti-depressants so far-  1/23- discussed again- will start Paxil 10 mg QHS to try and avoid side effects.   Pt states this med makes her tired  1/25- con't Paxil if possible.   1/26- still taking per chart- brighter affect  1/28- bright affect- handling much better  17 peripheral edema, r/t weakness in LEs rec elevation in recliner during the day today (no therapy) and knee hi teds   1/31- will check Dopplers LE B/L due to continued LE swelling.  2/1- Dopplers (-)- likely due ot Norvasc AND LE weakness combined to cause swelling- needs TEDs.    2/2- pt feels there's a way to "fix" edema- which appears to be mild currently- will monitor-    LOS: 15 days A FACE TO FACE EVALUATION WAS PERFORMED  Zion Ta 10/22/2020, 9:24 AM

## 2020-10-23 MED ORDER — POLYETHYLENE GLYCOL 3350 17 G PO PACK
17.0000 g | PACK | Freq: Every day | ORAL | Status: DC | PRN
  Administered 2020-10-29: 17 g via ORAL
  Filled 2020-10-23: qty 1

## 2020-10-23 MED ORDER — METAXALONE 800 MG PO TABS
800.0000 mg | ORAL_TABLET | Freq: Three times a day (TID) | ORAL | Status: DC | PRN
  Administered 2020-10-26 – 2020-11-07 (×23): 800 mg via ORAL
  Filled 2020-10-23 (×28): qty 1

## 2020-10-23 NOTE — Progress Notes (Signed)
Occupational Therapy Session Note  Patient Details  Name: Mary Davila MRN: [id number] Date of Birth: 04-16-1956  Today's Date: 10/23/2020 OT Individual Time: 3291-9166 OT Individual Time Calculation (min): 71 min    Short Term Goals: Week 3:  OT Short Term Goal 2 (Week 3): Pt will don pants at bed level with min A using AE PRN OT Short Term Goal 3 (Week 3): Pt will perform toileting tasks seated on BSC with mod A for clothing management OT Short Term Goal 4 (Week 3): Pt will complete LB dressing tasks at bed level with min A using AE PRN  Skilled Therapeutic Interventions/Progress Updates:    Pt seated in w/c upon arrival and preparing shoes with shoe funnel for donning shoes. Pt required assistance donning/fastening shoes. Pt propelled w/c to gym and initially engaged in [place] activity with focus on reaching forward outside BOS and using contralateral UE resting on thigh while reaching to touch target on screen. Pt propelled w/c to therapy mat and tranfserred to EOM. Pt practiced SB placement and EOM<>w/c transfers followed by SB removal. Pt practiced to Rt and Lt x 4 each way. Pt completed all transfers with CGA and was able to place/remove SB without assistance. Pt propelled w/c back to room and remained in w/c with all needs within reach.   Therapy Documentation Precautions:  Precautions Precautions: Back,Fall Precaution Comments: Reviewed back precautions Required Braces or Orthoses:  (no brace needed) Other Brace: no brace needed per chart/orders Restrictions Weight Bearing Restrictions: No  Pain:  Pt denies pain this morning  Therapy/Group: Individual Therapy  Rich Brave 10/23/2020, 9:34 AM

## 2020-10-23 NOTE — Progress Notes (Signed)
[facility] PHYSICAL MEDICINE & REHABILITATION PROGRESS NOTE   Subjective/Complaints:  Pt reports couldn't sleep well- for some reason, but doing OK this AM.   Denies dizziness/lightheadedness (from low BP).  Swelling improved yesterday in LEs- has a little, but not bad.   Asking about a muscle relaxant for pain, but notes Flexeril makes her very sleepy- wondering if other options.    ROS:  Pt denies SOB, abd pain, CP, N/V/C/D, and vision changes   Objective:   DG Chest 2 View  Result Date: 10/21/2020 CLINICAL DATA:  Swelling of legs for 2 days, shortness of breath, recent spine surgery EXAM: CHEST - 2 VIEW COMPARISON:  None. FINDINGS: No consolidation, features of edema, pneumothorax, or effusion. Pulmonary vascularity is normally distributed. The cardiomediastinal contours are unremarkable. No acute osseous or soft tissue abnormality. Degenerative changes are present in the imaged spine and shoulders. Evidence of calcific tendinosis in the right shoulder. Lumbar fusion hardware is partially visualized within the margins of imaging without gross abnormality of the included hardware. IMPRESSION: No acute cardiopulmonary abnormality. Electronically Signed   By: Kreg Shropshire M.D.   On: 10/21/2020 19:26   No results for input(s): WBC, HGB, HCT, PLT in the last 72 hours. Recent Labs    10/21/20 0456  NA 143  K 4.5  CL 107  CO2 26  GLUCOSE 93  BUN 17  CREATININE 0.94  CALCIUM 9.7    Intake/Output Summary (Last 24 hours) at 10/23/2020 0856 Last data filed at 10/23/2020 0730 Gross per 24 hour  Intake 716 ml  Output --  Net 716 ml        Physical Exam: Vital Signs Blood pressure (!) 131/91, pulse 97, temperature 98.4 F (36.9 C), resp. rate 18, height 5' 5.5" (1.664 m), weight 71.1 kg, SpO2 100 %.  General: sitting up in w/c, wandering around room getting cleaned up, appropriate, NAD Mood and affect less anxious- no tremors seen Heart: borderline tachycardia- regular  rhythm Pulm: CTA B/L- no W/R/R- good air movement Abdomen: Soft, NT, ND, (+)BS   Extremities: No clubbing, cyanosis, or edema Musculoskeletal:    Comments: UE's 5/5 in deltoid, biceps, triceps, WE grip and finger abd LE's RLE- HF 2-/5, KE 3+/5, DF and PF 0/5- unchanged, esp in DF and PF LLE- HF 1/5, KE 3+/5, DF and PF 0/5 - unchanged esp in DF and PF Skin: back dressing in place - honeycomb- no drainage on dressing C/D/I- not assessed today Trace LE swelling to ankles- L>R Neurological: Ox3- slowed processing- no change  B/L foot drop as above - no change  Assessment/Plan: 1. Functional deficits which require 3+ hours per day of interdisciplinary therapy in a comprehensive inpatient rehab setting.  Physiatrist is providing close team supervision and 24 hour management of active medical problems listed below.  Physiatrist and rehab team continue to assess barriers to discharge/monitor patient progress toward functional and medical goals  Care Tool:  Bathing    Body parts bathed by patient: Right arm,Left arm,Chest,Abdomen,Front perineal area,Right upper leg,Left upper leg,Face   Body parts bathed by helper: Buttocks,Right lower leg,Left lower leg Body parts n/a: Right lower leg,Left lower leg   Bathing assist Assist Level: Minimal Assistance - Patient > 75%     Upper Body Dressing/Undressing Upper body dressing Upper body dressing/undressing activity did not occur (including orthotics): Safety/medical concerns What is the patient wearing?: Pull over shirt    Upper body assist Assist Level: Supervision/Verbal cueing    Lower Body Dressing/Undressing Lower body dressing  What is the patient wearing?: Pants,Underwear/pull up     Lower body assist Assist for lower body dressing: Moderate Assistance - Patient 50 - 74%     Toileting Toileting Toileting Activity did not occur Press photographer and hygiene only): N/A (no void or bm)  Toileting assist Assist for  toileting: Maximal Assistance - Patient 25 - 49%     Transfers Chair/bed transfer  Transfers assist  Chair/bed transfer activity did not occur: Safety/medical concerns  Chair/bed transfer assist level: Contact Guard/Touching assist Chair/bed transfer assistive device:  (Squat pivot)   Locomotion Ambulation   Ambulation assist   Ambulation activity did not occur: Safety/medical concerns          Walk 10 feet activity   Assist  Walk 10 feet activity did not occur: Safety/medical concerns        Walk 50 feet activity   Assist Walk 50 feet with 2 turns activity did not occur: Safety/medical concerns         Walk 150 feet activity   Assist Walk 150 feet activity did not occur: Safety/medical concerns         Walk 10 feet on uneven surface  activity   Assist Walk 10 feet on uneven surfaces activity did not occur: Safety/medical concerns         Wheelchair     Assist Will patient use wheelchair at discharge?: Yes Type of Wheelchair: Manual    Wheelchair assist level: Supervision/Verbal cueing Max wheelchair distance: 300 ft    Wheelchair 50 feet with 2 turns activity    Assist        Assist Level: Supervision/Verbal cueing   Wheelchair 150 feet activity     Assist      Assist Level: Supervision/Verbal cueing   Blood pressure (!) 131/91, pulse 97, temperature 98.4 F (36.9 C), resp. rate 18, height 5' 5.5" (1.664 m), weight 71.1 kg, SpO2 100 %.  Medical Problem List and Plan: 1.  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  Antithrombotics: -DVT/anticoagulation:  Pharmaceutical: Heparin 1/20- changed to Lovenox 1/31- will check Dopplers B/L since has  acute onset LE edema in setting of SCI pt.  2/1- Dopplers (-) for DVT- con't regimen  2/2- will need to send home on Lovenox for a total of 3 months to prevent DVT/PE              -antiplatelet therapy: N/a 3. Pain Management: Currently using hydrocodone with flexeril prn- changed from robaxin, since wasn't working   1/20- pt reports pain meds make her hurt "worse"- refusing vicodin and "allergic to tramadol" and refused cymbalta- says gabapentin causes swelling- will con't tylenol prn- cannot do excedrin due to bleeding risk  1/21- encouraged pt to continue taking keppra since I don't think it's the cause of increased pain- I think it's meds wearing off- that's more likely- explained to pt.   1/23- will try to increase Keppra to 500 mg BID for nerve pain- cont' tylenol since pt refuses everything else.   1/31- pain more managebale, con't regimen  2/2- says is scared Keppra cause of jitteriness- will start Vit B  complex to help with mood- but think friend's death is more cause of Sx's. con't regimen  2/3- less jittery- no tremors seen- will change Flexeril to Skelaxin due to severe sedation with Flexeril/Robaxin.  4. Mood: LCSW to follow for evaluation and support.              -antipsychotic agents:  N/A 5. Neuropsych: This patient is capable of making decisions on her own behalf. 6. Skin/Wound Care: Monitor wound for healing.  7. Fluids/Electrolytes/Nutrition: Monitor I/O. Check lytes in am.  8.  HTN: Monitor BP tid--having HYPOTENSION per therapy notes and pt- will stop Norvasc 5 mg daily and see if does ok- might need midodrine for hypotension   1/19- BP very slightly high- due to off Norvasc- but hopefully will help orthostatic hypotension- will monitor  1/20- will restart Norvasc 2.5 mg daily since BP 156/91 this AM  1/21- BP 120/73 this AM- better with low dose Norvasc  1/22- BP 120s/but need to start Flomax- so will start Midodrine 2.5 mg TID with meals, so leaves room in BP for flomax to  lower BP.   1/28- BP much better- 110s-120s/70s- con't low dose Norvasc and low dose Midrodrine, since when stopped Norvasc, BP went up to 160-170s Systolic  1/31- BP controlled- they stopped Midodrine this weekend since off Flomax- is having LE edema- pt doesn't want to stop.  Vitals:   10/22/20 1927 10/23/20 0419  BP: (!) 151/98 (!) 131/91  Pulse: 66 97  Resp: 18 18  Temp: 98.3 F (36.8 C) 98.4 F (36.9 C)  SpO2: 100% 100%  .  2/1- BP 116/92- tolerating current BP- Norvasc and LE weakness likely main reason for LE edema- since Dopplers (-) for DVT- con't regimen  2/3- BP actually a little on higher side in last 24 hours, which is new- will make sure haven't changed BP cuff? No change in meds 9. Hypokalemia: Preadmission labs showed K+2.9. ON Kdur 40 meq bid--check follow up labs today. Fluids d/c yesterday.   1/27- K+ 4.1- doing well- con't regimen  2/1- K+ 4.5- con't regimen 10. Mild anemia: Recheck CBC for post op labs--likely has ABLA.   11.  COPD: Respiratory status stable on Dulera bid. Albuterol prn SOB.  12. Neurogenic bladder:  Will d/c foley  In a couple of days- right now cannot tolerate Flomax due to low BP issues- will give it a few days, since having to start to deal with bowel program today- will remove Friday vs Monday-   1/19- foley removed  1/22- will start Flomax- per above about Midodrine so can tolerate Flomax.   1/23- started flomax- will take days if will help.  1/25- pt to learn to cath- needs mirror  1/26- pt voided some- will see if able to void- voiding "some" but pushing- hard to tell if pushing too hard.   1/27- pt voiding some on toilet- as well as having small BMs- con't Flomax  1/28- hasn't required in/out caths for 24 hours- con't regimen C/o freq voids q1h, will stop flomax ( and midodrine) check PVRs  1/31 since flomax effect can reduce over time- will con't to check PVRs- will check for 3 days- I'm concerned that pt will stop being able to void off  Flomax, but will see.  2/3- still voiding well- con't regimen- can stop PVRs- will order ? Bladder spasms vs , ZPP dysesthesia 13. Neurogenic bowel: Has had constipation for past few months (goes once a week). She had multiple BMs (hard  balls -->liquid) yesterday after mag citrate. Will order KUB to check stool burden.   Will continue Senna S with miralax daily. Schedule suppository after supper.     - will make sure we start bowel program this evening- discussed at length with pt about bowel program.  1/22- no BM with bowel program- had accident this AM  1/23- pt says had Bowel program- not documented in chart  1/25- bowel program went well- con't regimen  1/26- had results on toilet- working better  1/27- results on bowel program last night AND results on toilet overnight.   1/28- has had BM on toilet- hasn't needed bowel program  2/1- pt feels she still needs dig stim/suppository since helps her empty completely- will have pt learn to do bowel program  2/2- pt then had good BM on toilet- so doesn't think still needs bowel program - will see over next few days if needs- also, will make senokot prn since pt refuses to take daily - stools were going well- wants to reduce meds  2/3- will monitor- concerned will get constipated off Senokot? 14. At level SCI pain-  1/20- changed to keppra 250 mg BID for nerve pain  1/21- pt doesn't want to take- I asked her oto continue.   2/2- changed Gabapentin to QHS per pt request 15. Low grade fever this AM  1/19- WBC normal- no left shift seen- get U/A and Cx- and monitor- if U/A (+), will start PO ABX.   1/20- U/A (-)  1/22- no more fevers  1/27- rechecked U/A on 1/25- (+) but had 30k Ecoli- sensitive to everything- Sx's improving- wait on starting ABX since not 100k EColi.  16. Severe depression  1/22- has refused anti-depressants so far-  1/23- discussed again- will start Paxil 10 mg QHS to try and avoid side effects.   Pt states this med makes her  tired  1/25- con't Paxil if possible.   1/26- still taking per chart- brighter affect  1/28- bright affect- handling much better  2/3- doing better overall per therapy- con't regimen  17 peripheral edema, r/t weakness in LEs rec elevation in recliner during the day today (no therapy) and knee hi teds   1/31- will check Dopplers LE B/L due to continued LE swelling.  2/1- Dopplers (-)- likely due ot Norvasc AND LE weakness combined to cause swelling- needs TEDs.    2/2- pt feels there's a way to "fix" edema- which appears to be mild currently- will monitor-   2/3- swelling improved greatly- is trace this AM - con't regimen with TEDs.   LOS: 16 days A FACE TO FACE EVALUATION WAS PERFORMED  Symantha Steeber 10/23/2020, 8:56 AM

## 2020-10-23 NOTE — Progress Notes (Signed)
Occupational Therapy Session Note  Patient Details  Name: Mary Davila MRN: [id number] Date of Birth: 10/26/1955  Today's Date: 10/23/2020 OT Individual Time: 1115-1200 OT Individual Time Calculation (min): 45 min    Short Term Goals: Week 3:  OT Short Term Goal 2 (Week 3): Pt will don pants at bed level with min A using AE PRN OT Short Term Goal 3 (Week 3): Pt will perform toileting tasks seated on BSC with mod A for clothing management OT Short Term Goal 4 (Week 3): Pt will complete LB dressing tasks at bed level with min A using AE PRN  Skilled Therapeutic Interventions/Progress Updates:    OT intervention with focus on w/c mobility, squat pivot transfers, discharge planning/problem solving, dynamic sitting balance, and safety awareness to increase independence with BADLs/transfers. Pt propelled w/c to ADL apartment to discuss bed recommendations. Pt wants to keep her bed but realizes after seeing ADL bed that transferring to her bed is not feasible. Pt agreed to recommendation for hospital bed. Also discussed placement of BSC during night. Pt has to urinate frequently and had planned on going to bathroom. Discussed option of placing at foot of bed and pt in agreement.Pt propelled w/c to gym and performed squat pivot transfer to EOM with CGA. Pt engaged in kicking ball while seated EOM. Pt with delayed response when ball rolled to her. Performance improved with practice. Pt transferred back to w/c and propelled w/c to room. Pt remained in w/c with all needs within reach.   Therapy Documentation Precautions:  Precautions Precautions: Back,Fall Precaution Comments: Reviewed back precautions Required Braces or Orthoses:  (no brace needed) Other Brace: no brace needed per chart/orders Restrictions Weight Bearing Restrictions: No  Pain:     Therapy/Group: Individual Therapy  Rich Brave 10/23/2020, 12:31 PM

## 2020-10-23 NOTE — Progress Notes (Signed)
Physical Therapy Session Note  Patient Details  Name: Mary Davila MRN: [id number] Date of Birth: [birth year]  Today's Date: 10/23/2020 PT Individual Time: 1005-1105 PT Individual Time Calculation (min): 60 min   Short Term Goals: Week 3:  PT Short Term Goal 1 (Week 3): Pt will perform bed mobility with min A consistently PT Short Term Goal 2 (Week 3): Pt will be independent with adaptive equipment for bed mobility and transfers. PT Short Term Goal 3 (Week 3): Pt will maintain dynamic sitting balance with close supervision x5 minutes.  Skilled Therapeutic Interventions/Progress Updates: Pt presented in w/c with dgt present agreeable to therapy. Pt denies pain during session. Pt propelled to rehab gym and participated in block practice transfers w/c to mat with SB. Pt initially required increased cues to scoot more forward to allow easier placement of SB. PTA also recommended to boost onto SB to decrease possibility of pants catching. With practice pt was able to perform with both long and short SB with CGA nearing supervision with verbal cues. PTA then discussed performing car transfer with pt verbalizing that she anticipates that she'll be able to squat pivot. Pt the propelled to car and PTA demonstrated that on car simulator distance between seat and w/c is much smaller and that safest practice would be for SB. Upon visualization pt verbalized understanding and agreeable to try car transfer tomorrow. Pt propelled back to room as requesting to use bathroom prior to next session. Performed Stedy transfer to toilet and pt left sitting at toilet with call bell within reach and NT aware of pt's disposition.      Therapy Documentation Precautions:  Precautions Precautions: Back,Fall Precaution Comments: Reviewed back precautions Required Braces or Orthoses:  (no brace needed) Other Brace: no brace needed per chart/orders Restrictions Weight Bearing Restrictions: (P) No General:   Vital  Signs: Therapy Vitals Temp: 99.8 F (37.7 C) Temp Source: Oral Pulse Rate: 78 Resp: 16 BP: (!) 145/85 Patient Position (if appropriate): Sitting Oxygen Therapy SpO2: 100 % O2 Device: Room Air Pain:   Mobility:   Locomotion :    Trunk/Postural Assessment :    Balance:   Exercises:   Other Treatments:      Therapy/Group: Individual Therapy  Aradhana Gin 10/23/2020, 4:19 PM

## 2020-10-23 NOTE — Progress Notes (Signed)
Occupational Therapy Weekly Progress Note  Patient Details  Name: Mary Davila MRN: [id number] Date of Birth: July 13, 1956  Beginning of progress report period: October 16, 2020 End of progress report period: October 23, 2020  Patient has met 2 of 4 short term goals.  Pt is making steady progress with BADLs and functional transfers. Pt completes bathing tasks at bed level or shower level with min A. Pt currently requires mod A for LB dressing tasks but is able to thread her pants over her feet seated in w/c or long sitting in bed. Pt requires assistance pulling pants over hips standing in [place] or at bed level. Pt performs TTB transfers and drop arm BSC tranfsers using SB with min A/CGA. Pt performs squat pivot transfers to level surface with CGA and min verbal cues for w/c setup/technique. Pt will be discharging home and will need a hospital bed in addition to TTB and drop arm BSC.   Patient continues to demonstrate the following deficits: muscle weakness and muscle paralysis, decreased cardiorespiratoy endurance, impaired timing and sequencing, unbalanced muscle activation and decreased coordination and decreased sitting balance, decreased standing balance and decreased balance strategies and therefore will continue to benefit from skilled OT intervention to enhance overall performance with BADL and Reduce care partner burden.  Patient progressing toward long term goals..  Continue plan of care.  OT Short Term Goals Week 2:  OT Short Term Goal 1 (Week 2): Pt will perform drop arm BSC tranfser using SB with min a OT Short Term Goal 1 - Progress (Week 2): Met OT Short Term Goal 2 (Week 2): Pt will don pants at bed level with min A using AE PRN OT Short Term Goal 2 - Progress (Week 2): Progressing toward goal OT Short Term Goal 3 (Week 2): Pt will perform LB bahting at bed level using AE PRN OT Short Term Goal 3 - Progress (Week 2): Met OT Short Term Goal 4 (Week 2): Pt will perform toileting tasks  seated on BSC with mod A for clothing management OT Short Term Goal 4 - Progress (Week 2): Progressing toward goal Week 3:  OT Short Term Goal 2 (Week 3): Pt will don pants at bed level with min A using AE PRN OT Short Term Goal 3 (Week 3): Pt will perform toileting tasks seated on BSC with mod A for clothing management OT Short Term Goal 4 (Week 3): Pt will don shoes at bed level with min A   Leroy Libman 10/23/2020, 6:24 AM

## 2020-10-23 NOTE — Plan of Care (Signed)
  Problem: Consults Goal: RH SPINAL CORD INJURY PATIENT EDUCATION Description:  See Patient Education module for education specifics.  Outcome: Progressing   Problem: SCI BOWEL ELIMINATION Goal: RH STG MANAGE BOWEL WITH ASSISTANCE Description: STG Manage Bowel with mod Assistance. Outcome: Progressing Goal: RH STG SCI MANAGE BOWEL PROGRAM W/ASSIST OR AS APPROPRIATE Description: STG SCI Manage bowel program with mod assist Outcome: Progressing   Problem: SCI BLADDER ELIMINATION Goal: RH STG SCI MANAGE BLADDER PROGRAM W/ASSISTANCE Description: Manage bladder program with mod assist Outcome: Progressing Goal: RH OTHER STG BLADDER ELIMINATION GOALS W/ASSIST Description: Other STG Bladder Elimination Goals With mod Assistance Outcome: Progressing   Problem: RH PAIN MANAGEMENT Goal: RH STG PAIN MANAGED AT OR BELOW PT'S PAIN GOAL Description: Pain level less than 4 on scale of 0-10 Outcome: Progressing   Problem: RH KNOWLEDGE DEFICIT SCI Goal: RH STG INCREASE KNOWLEDGE OF SELF CARE AFTER SCI Description: Pt will be able to demonstrate understanding of bowel and bladder management following SCI with min assist using handouts provided. Pt will be able to perform own bowel program independently upon discharge.  Outcome: Progressing   

## 2020-10-24 NOTE — Progress Notes (Addendum)
[name] PHYSICAL MEDICINE & REHABILITATION PROGRESS NOTE   Subjective/Complaints:  Still having swelling- thinks TEDs are causing it, but explained could just be coincidental/timing.   Hasn't taken Skelaxin yet- has forgotten to ask for it.   Wears TEDs until end of PT/OT.    ROS:  Pt denies SOB, abd pain, CP, N/V/C/D, and vision changes    Objective:   No results found. No results for input(s): WBC, HGB, HCT, PLT in the last 72 hours. No results for input(s): NA, K, CL, CO2, GLUCOSE, BUN, CREATININE, CALCIUM in the last 72 hours.  Intake/Output Summary (Last 24 hours) at 10/24/2020 0933 Last data filed at 10/24/2020 0851 Gross per 24 hour  Intake 720 ml  Output -  Net 720 ml        Physical Exam: Vital Signs Blood pressure (!) 147/90, pulse 87, temperature 98.1 F (36.7 C), temperature source Oral, resp. rate 18, height 5' 5.5" (1.664 m), weight 71.1 kg, SpO2 98 %.  General: sitting up in manual w/c- watching TV- getting ready- in robe, PJ's, NAD Mood and affect calmer, but still a little anxious Heart: RRR- no JVD Pulm: CTA B/L- no W/R/R- good air movement Abdomen: Soft, NT, ND, (+)BS   Extremities: No clubbing, cyanosis, or edema Musculoskeletal:    Comments: UE's 5/5 in deltoid, biceps, triceps, WE grip and finger abd LE's RLE- HF 2-/5, KE 3+/5, DF and PF 0/5- unchanged, esp in DF and PF LLE- HF 1/5, KE 3+/5, DF and PF 0/5 - unchanged esp in DF and PF Skin: back dressing in place - honeycomb- no drainage on dressing C/D/I- not assessed today Trace LE swelling to ankles- L>R- no change this AM Neurological: Ox3- slowed processing- no change-   B/L foot drop as above - no change  Assessment/Plan: 1. Functional deficits which require 3+ hours per day of interdisciplinary therapy in a comprehensive inpatient rehab setting.  Physiatrist is providing close team supervision and 24 hour management of active medical problems listed below.  Physiatrist and  rehab team continue to assess barriers to discharge/monitor patient progress toward functional and medical goals  Care Tool:  Bathing    Body parts bathed by patient: Right arm,Left arm,Chest,Abdomen,Front perineal area,Right upper leg,Left upper leg,Face   Body parts bathed by helper: Buttocks,Right lower leg,Left lower leg Body parts n/a: Right lower leg,Left lower leg   Bathing assist Assist Level: Minimal Assistance - Patient > 75%     Upper Body Dressing/Undressing Upper body dressing Upper body dressing/undressing activity did not occur (including orthotics): Safety/medical concerns What is the patient wearing?: Pull over shirt    Upper body assist Assist Level: Supervision/Verbal cueing    Lower Body Dressing/Undressing Lower body dressing      What is the patient wearing?: Pants,Underwear/pull up     Lower body assist Assist for lower body dressing: Moderate Assistance - Patient 50 - 74%     Toileting Toileting Toileting Activity did not occur Press photographer and hygiene only): N/A (no void or bm)  Toileting assist Assist for toileting: Maximal Assistance - Patient 25 - 49%     Transfers Chair/bed transfer  Transfers assist  Chair/bed transfer activity did not occur: Safety/medical concerns  Chair/bed transfer assist level: Contact Guard/Touching assist Chair/bed transfer assistive device:  (Squat pivot)   Locomotion Ambulation   Ambulation assist   Ambulation activity did not occur: Safety/medical concerns          Walk 10 feet activity   Assist  Walk 10  feet activity did not occur: Safety/medical concerns        Walk 50 feet activity   Assist Walk 50 feet with 2 turns activity did not occur: Safety/medical concerns         Walk 150 feet activity   Assist Walk 150 feet activity did not occur: Safety/medical concerns         Walk 10 feet on uneven surface  activity   Assist Walk 10 feet on uneven surfaces activity  did not occur: Safety/medical concerns         Wheelchair     Assist Will patient use wheelchair at discharge?: Yes Type of Wheelchair: Manual    Wheelchair assist level: Supervision/Verbal cueing Max wheelchair distance: 300 ft    Wheelchair 50 feet with 2 turns activity    Assist        Assist Level: Supervision/Verbal cueing   Wheelchair 150 feet activity     Assist      Assist Level: Supervision/Verbal cueing   Blood pressure (!) 147/90, pulse 87, temperature 98.1 F (36.7 C), temperature source Oral, resp. rate 18, height 5' 5.5" (1.664 m), weight 71.1 kg, SpO2 98 %.  Medical Problem List and Plan: 1.  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  Antithrombotics: -DVT/anticoagulation:  Pharmaceutical: Heparin 1/20- changed to Lovenox 1/31- will check Dopplers B/L since has acute onset LE edema in setting of SCI pt.  2/1- Dopplers (-) for DVT- con't regimen  2/2- will need to send home on Lovenox for a total of 3 months to prevent DVT/PE              -antiplatelet therapy: N/a 3. Pain Management: Currently using hydrocodone with flexeril prn- changed from robaxin, since wasn't working   1/20- pt reports pain meds make her hurt "worse"- refusing vicodin and "allergic to tramadol" and refused cymbalta- says gabapentin causes swelling- will con't tylenol prn- cannot do excedrin due to bleeding risk  1/21- encouraged pt to continue taking keppra since I don't think it's the cause of increased pain- I think it's meds wearing off- that's more likely- explained to pt.   1/23- will try to increase Keppra to 500 mg BID for nerve pain- cont' tylenol since pt refuses everything else.   1/31- pain  more managebale, con't regimen  2/2- says is scared Keppra cause of jitteriness- will start Vit B complex to help with mood- but think friend's death is more cause of Sx's. con't regimen  2/3- less jittery- no tremors seen- will change Flexeril to Skelaxin due to severe sedation with Flexeril/Robaxin.   2/4- hasn't tried Skelaxin yet- recommended taking it- have to ask for it.  4. Mood: LCSW to follow for evaluation and support.              -antipsychotic agents:  N/A 5. Neuropsych: This patient is capable of making decisions on her own behalf. 6. Skin/Wound Care: Monitor wound for healing.  7. Fluids/Electrolytes/Nutrition: Monitor I/O. Check lytes in am.  8.  HTN: Monitor BP tid--having HYPOTENSION per therapy notes and pt- will stop Norvasc 5 mg daily and see if  does ok- might need midodrine for hypotension    Vitals:   10/24/20 0746 10/24/20 0912  BP:  (!) 147/90  Pulse:  87  Resp:  18  Temp:  98.1 F (36.7 C)  SpO2: 99% 98%  .  2/1- BP 116/92- tolerating current BP- Norvasc and LE weakness likely main reason for LE edema- since Dopplers (-) for DVT- con't regimen  2/3- BP actually a little on higher side in last 24 hours, which is new- will make sure haven't changed BP cuff? No change in meds  2/4- BP a little high- requiring low dose Norvasc and hasn't required Midodrine yet.  9. Hypokalemia: Preadmission labs showed K+2.9. ON Kdur 40 meq bid--check follow up labs today. Fluids d/c yesterday.   1/27- K+ 4.1- doing well- con't regimen  2/1- K+ 4.5- con't regimen 10. Mild anemia: Recheck CBC for post op labs--likely has ABLA.   11.  COPD: Respiratory status stable on Dulera bid. Albuterol prn SOB.  12. Neurogenic bladder:  Will d/c foley  In a couple of days- right now cannot tolerate Flomax due to low BP issues- will give it a few days, since having to start to deal with bowel program today- will remove Friday vs Monday-   1/19- foley removed  2/3- still voiding well- con't  regimen- can stop PVRs- will order  2/4- voiding well off Flomax- con't reigmen ? Bladder spasms vs , ZPP dysesthesia 13. Neurogenic bowel: Has had constipation for past few months (goes once a week). She had multiple BMs (hard balls -->liquid) yesterday after mag citrate. Will order KUB to check stool burden.   Will continue Senna S with miralax daily. Schedule suppository after supper.     - will make sure we start bowel program this evening- discussed at length with pt about bowel program.  2/3- will monitor- concerned will get constipated off Senokot?  2/4- if gets constipated, add Senokot back- has neurogenic bowel, but off Bowel program.  14. At level SCI pain-  1/20- changed to keppra 250 mg BID for nerve pain  1/21- pt doesn't want to take- I asked her oto continue.   2/2- changed Gabapentin to QHS per pt request 15. Low grade fever this AM  1/27- rechecked U/A on 1/25- (+) but had 30k Ecoli- sensitive to everything- Sx's improving- wait on starting ABX since not 100k EColi.  16. Severe depression  1/22- has refused anti-depressants so far-  2/3- doing better overall per therapy- con't regimen  17 peripheral edema, r/t weakness in LEs rec elevation in recliner during the day today (no therapy) and knee hi teds    2/1- Dopplers (-)- likely due ot Norvasc AND LE weakness combined to cause swelling- needs TEDs.   2/3- swelling improved greatly- is trace this AM - con't regimen with TEDs.   2/4- pt reports swelling gets bad/is painful- will add ACE wraps over TEDs- can remove after therapy and elevate.    LOS: 17 days A FACE TO FACE EVALUATION WAS PERFORMED  Mary Davila 10/24/2020, 9:33 AM

## 2020-10-24 NOTE — Progress Notes (Signed)
Occupational Therapy Session Note  Patient Details  Name: Mary Davila MRN: [id number] Date of Birth: June 05, 1956  Today's Date: 10/24/2020 OT Individual Time: 1135-1230 OT Individual Time Calculation (min): 55 min    Short Term Goals: Week 3:  OT Short Term Goal 2 (Week 3): Pt will don pants at bed level with min A using AE PRN OT Short Term Goal 3 (Week 3): Pt will perform toileting tasks seated on BSC with mod A for clothing management OT Short Term Goal 4 (Week 3): Pt will complete LB dressing tasks at bed level with min A using AE PRN  Skilled Therapeutic Interventions/Progress Updates:    Pt received in recliner and discussed her fall earlier. Spent time reviewing back precautions as part of the reason pt fell was that she was reaching far forward to open bathroom door.   Discussed her new skill of squat pivots so decided to focus on toilet transfers. Needed to obtain other BSC with drop arm.   Got pt set up for squat pivot to wc and then could not get arm rest out as it was put in backwards.  Had to get help from other therapist to remove arm rest.   Returned to room and pt did a squat pivot with CGA and support to keep feet placed on floor.   Completed squat to toilet to her R with min A.  Lateral leans to doff pants with pt completing a push up for brief removal.  Had a BM on toilet. Total A with clean up as she lateral leaned to Left.  Lateral leans to pull pants up with total A.  Min to sq pivot back to wc.   Did recommend that she continue to use the stedy with nursing staff.   Pt set up in wc to prepare for lunch. Belt alarm on and all needs met.    Therapy Documentation Precautions:  Precautions Precautions: Back,Fall Precaution Comments: Reviewed back precautions Required Braces or Orthoses:  (no brace needed) Other Brace: no brace needed per chart/orders Restrictions Weight Bearing Restrictions: No  Pain: no c/o pain     Therapy/Group: Individual  Therapy  [place] 10/24/2020, 1:16 PM

## 2020-10-24 NOTE — Progress Notes (Signed)
Physical Therapy Session Note  Patient Details  Name: Mary Davila MRN: [id number] Date of Birth: 1955/10/13  Today's Date: 10/24/2020 PT Individual Time: 1006-1104 and 1305 - 1347 PT Individual Time Calculation (min): 58 min and 42 min Short Term Goals: Week 3:  PT Short Term Goal 1 (Week 3): Pt will perform bed mobility with min A consistently PT Short Term Goal 2 (Week 3): Pt will be independent with adaptive equipment for bed mobility and transfers. PT Short Term Goal 3 (Week 3): Pt will maintain dynamic sitting balance with close supervision x5 minutes.  Skilled Therapeutic Interventions/Progress Updates: Pt presented in w/c agreeable to therapy. Pt with increased anxiety today due to fall earlier this am when she slid forward to floor in w/c. Pt propelled to ortho gym and discussed car transfer. Pt was able to set up w/c appropriately and provided assist for SB transfer. Pt performed SB transfer with minA and +2 for safety due to anxiety. Pt was able to return to w/c with CGA and assist for SB set up. Pt then propelled to day room and participated in Cybex Kinetron 90cm/sec with AROM x 10 cycles using BLE! Pt surprised that she was able to push with LLE. Increased resistance to 80cm/sec and was able to perform 3 sets of 10 cycles with brief rests. Pt then propelled back to room and performed Stedy transfer to recliner as pt has been having increased BLE edema later in day. PTA removed TED hose per request as pt states swelling decreased after TED hose was removed previous night. Pt left in recliner with seat alarm on, call bell within reach and current needs met.   Tx2: Pt presented in w/c agreeable to therapy. Pt states transferred back to w/c after OT session due to lunch arriving. PTA donned TED hose with pt noting difference in BLE due to taking break in recliner and having legs elevated. Pt propelled to ortho gym and participated in standing frame activities. Pt continues to only demonstrate  trace activation in glutes with compensatory hip thrust in standing. Pt was able to perform small range TKE x 10 as well as TKE with 10 sec hold x 5. Pt stood for total of 8 min this session. Pt then propelled up/down ramp with pt requiring no assistance to propel over threshold and ascend ramp but increased effort noted. Propelled to rehab gym and pt bumped onto floor mat and propelled across x 3 for demonstration of propulsion on different surfaces. Pt then handed off to [place], [place] for following session.      Therapy Documentation Precautions:  Precautions Precautions: Back,Fall Precaution Comments: Reviewed back precautions Required Braces or Orthoses:  (no brace needed) Other Brace: no brace needed per chart/orders Restrictions Weight Bearing Restrictions: No General:   Vital Signs: Therapy Vitals Temp: 97.9 F (36.6 C) Pulse Rate: 76 Resp: 18 BP: (!) 144/84 Patient Position (if appropriate): Sitting Oxygen Therapy SpO2: 99 % O2 Device: Room Air Pain: Pain Assessment Pain Scale: Faces Faces Pain Scale: Hurts a little bit Pain Type: Acute pain Pain Location: Leg Pain Orientation: Right Pain Descriptors / Indicators: Discomfort Pain Onset: With Activity Pain Intervention(s): Repositioned;Emotional support Mobility:   Locomotion :    Trunk/Postural Assessment :    Balance:   Exercises:   Other Treatments:      Therapy/Group: Individual Therapy  Mary Davila 10/24/2020, 4:18 PM

## 2020-10-24 NOTE — Progress Notes (Signed)
Recreational Therapy Session Note  Patient Details  Name: Mary Davila MRN: [id number] Date of Birth: [birth year] Today's Date: 10/24/2020  Pain: no c/o Skilled Therapeutic Interventions/Progress Updates: Pt in w/c somewhat tearful upon arrival, with dynamap attached.  Pt stated that she had experienced a fall from her w/c earlier this morning as she was reaching to put dirty laundry in the bag.  Pt says she slipped forward out of her chair landing on her bottom.  Pt stated she's ok, but visibly upset that this occurred.  Emotional support provided at this time and throughout session. Pt agreeable to therapy.  Pt propelled w/c throughout the unit with supervision using BUEs, ocassional min verbal cues for technique.  Pt performed slide board transfer to and from car with min assist and +2 for safety as pt stated some anxiety.   Therapy/Group: Co-Treatment Ashby Moskal 10/24/2020, 12:21 PM

## 2020-10-24 NOTE — Progress Notes (Signed)
Recreational Therapy Session Note  Patient Details  Name: Alize Borrayo MRN: [id number] Date of Birth: [birth year] Today's Date: 10/24/2020 Late entry for 10/23/20 Pain: c/o of back pain, pt repositioning in recliner, declined further intervention at that time Skilled Therapeutic Interventions/Progress Updates: Session focused on discharge planning and coping strategies.  Pt stated that she had problem solved through various obstacles with therapy staff today in preparation for discharge.  Although some of the solutions are not her first choice, pt stated understanding that they are necessary at this time.  Pt acknowledges difficulty coping with her health issues and the recent news of her friends death.  Pt stated overcoming previous stressors/depressive symptoms related to the loss of her husband, and now is trying to stay positive and optimistic in her rehab/discharge efforts.  Reviewed coping strategies that were effective previously and how they could be of benefit now.  Also discussed her current support system and the importance of advocating for her social, emotional, & spiritual health in addition to her physical health as she continues through the rehab process.  Her faith is her primary source of strength and encouragement, however the recent death of her friend has provided new challenges as this friend was her prayer partner. Reviewed additional support people in her life.  Pt stated understanding.  Therapy/Group: Individual Therapy   Mckynna Vanloan 10/24/2020, 8:35 AM

## 2020-10-24 NOTE — Progress Notes (Signed)
Occupational Therapy Session Note  Patient Details  Name: Mary Davila MRN: [id number] Date of Birth: 1956/06/12  Today's Date: 10/24/2020 OT Individual Time: 1352-1449 OT Individual Time Calculation (min): 57 min    Short Term Goals: Week 3:  OT Short Term Goal 2 (Week 3): Pt will don pants at bed level with min A using AE PRN OT Short Term Goal 3 (Week 3): Pt will perform toileting tasks seated on BSC with mod A for clothing management OT Short Term Goal 4 (Week 3): Pt will complete LB dressing tasks at bed level with min A using AE PRN  Skilled Therapeutic Interventions/Progress Updates:    Worked on squat pivot toilet transfers to begin session.  She was able to complete with mod assist to the drop arm commode and back to the wheelchair.  Attempted clothing management in sitting with lateral leans, however pt unable to tolerate weightshift to the right secondary to increased pain in the right hamstrings.  Mary Davila was used for sit to stand with min assist and then she was able to pull her pants over her hips in standing.  She was able to complete a second toilet transfer onto the toilet with use of the Stedy secondary to urgency with total assist.  Total assist was also needed for clothing management and toilet hygiene sit to stand with pt having a small BM and urinating.  Finished session with transfer from the wheelchair over to the recliner in order to elevate her LEs.  Mod assist for squat pivot transfer.  Call button and phone in reach at end of session.    Therapy Documentation Precautions:  Precautions Precautions: Back,Fall Precaution Comments: Reviewed back precautions Required Braces or Orthoses:  (no brace needed) Other Brace: no brace needed per chart/orders Restrictions Weight Bearing Restrictions: No  Pain: Pain Assessment Pain Scale: Faces Faces Pain Scale: Hurts a little bit Pain Type: Acute pain Pain Location: Leg Pain Orientation: Right Pain Descriptors /  Indicators: Discomfort Pain Onset: With Activity Pain Intervention(s): Repositioned;Emotional support ADL: See Care Tool Section for some details of mobility and selfcare  Therapy/Group: Individual Therapy  Mary Davila OTR/L 10/24/2020, 4:11 PM

## 2020-10-24 NOTE — Progress Notes (Signed)
Patient ID: Mary Davila, female   DOB: May 07, 1956, 65 y.o.   MRN: [id number]  SW received updates from pt sister Althea Charon stating that she and pt dtr will be in for family edu on Wed 2/9 1pm-3pm. Will schedule an additional weekend day when they come in on Wednesday.   Cecile Sheerer, MSW, LCSWA Office: [phone] Cell: [phone] Fax: [phone]

## 2020-10-25 DIAGNOSIS — N319 Neuromuscular dysfunction of bladder, unspecified: Secondary | ICD-10-CM

## 2020-10-25 DIAGNOSIS — E876 Hypokalemia: Secondary | ICD-10-CM

## 2020-10-25 DIAGNOSIS — G8918 Other acute postprocedural pain: Secondary | ICD-10-CM

## 2020-10-25 DIAGNOSIS — K592 Neurogenic bowel, not elsewhere classified: Secondary | ICD-10-CM

## 2020-10-25 DIAGNOSIS — M5106 Intervertebral disc disorders with myelopathy, lumbar region: Secondary | ICD-10-CM

## 2020-10-25 DIAGNOSIS — R600 Localized edema: Secondary | ICD-10-CM

## 2020-10-25 DIAGNOSIS — R609 Edema, unspecified: Secondary | ICD-10-CM

## 2020-10-25 NOTE — Progress Notes (Signed)
Occupational Therapy Session Note  Patient Details  Name: Mary Davila MRN: [id number] Date of Birth: 08-25-1956  Today's Date: 10/25/2020 OT Individual Time: 4383-8184 OT Individual Time Calculation (min): 48 min    Short Term Goals: Week 1:  OT Short Term Goal 1 (Week 1): Pt will maintain sitting balance EOB during ADLs with min A and no use of hand rails OT Short Term Goal 1 - Progress (Week 1): Met OT Short Term Goal 2 (Week 1): Pt will don LB clothing at bed level with mod A OT Short Term Goal 2 - Progress (Week 1): Met OT Short Term Goal 3 (Week 1): Pt will roll R and L with use of bed features with mod A during LB dressing OT Short Term Goal 3 - Progress (Week 1): Met  Skilled Therapeutic Interventions/Progress Updates: patient focus this session was sitting balance while inititating proprioceptive feedback into bilateral lower extremities to ease balance for dynamic sitting and standing tasks (I.e. lower body dressing, putting towels away in dirty towel bag on bathroom door).   She stated she had leaned forward in her chair to put used towel into bag and slid out of her chair within the last couple of days.  More and more practice of  Proprioceptive awareness, balance and utilizing her vision for placement of lower extremities will help [name] to increase independence safer and so will more practice stabilizing one forearm when reaching forward for items with the opposite upper extremity.  She problem solved in kitchen to access lower kitchen cabinets, frigerator, scoot pot down counter and to sink.   As well, she problem solved how she might cook and utilize her home kitchen.    She was left in her w/c with nursing care at the end of the session.  Continue OT Plan of Care.       Therapy Documentation Precautions:  Precautions Precautions: Back,Fall Precaution Comments: Reviewed back precautions Required Braces or Orthoses:  (no brace needed) Other Brace: no brace needed  per chart/orders Restrictions Weight Bearing Restrictions: No  Pain:denied   ADL:  Therapy/Group: Individual Therapy  Herschell Dimes 10/25/2020, 3:35 PM

## 2020-10-25 NOTE — Progress Notes (Signed)
Physical Therapy Session Note  Patient Details  Name: Mary Davila MRN: [id number] Date of Birth: [birth year]  Today's Date: 10/25/2020 PT Individual Time: 1400-1445 PT Individual Time Calculation (min): 45 min   Short Term Goals: Week 3:  PT Short Term Goal 1 (Week 3): Pt will perform bed mobility with min A consistently PT Short Term Goal 2 (Week 3): Pt will be independent with adaptive equipment for bed mobility and transfers. PT Short Term Goal 3 (Week 3): Pt will maintain dynamic sitting balance with close supervision x5 minutes.  Skilled Therapeutic Interventions/Progress Updates:    Pt received seated in recliner in room, agreeable to PT session. Pt reports ongoing pain in R mid-lower back region, not rated and declines intervention. Sit to stand with CGA to stedy. Stedy transfer to w/c. Manual w/c propulsion 2 x 100 ft with use of BUE at Supervision level. Pt is Supervision for management of w/c parts. Squat pivot transfer w/c to/from mat table with CGA. Sit to stand x 4 reps with CGA from mat table and perched from stedy seat to full upright stand. Pt exhibits improved glute and quad activation this date with decreased reliance on BUE in standing and more upright posture. Pt does continue to exhibit heavy UE reliance for sit to stand transfer and to remain standing. Pt requests to use the bathroom at end of session. Stedy transfer to toilet. Pt left seated on elevated BSC over toilet with call button in reach.  Therapy Documentation Precautions:  Precautions Precautions: Back,Fall Precaution Comments: Reviewed back precautions Required Braces or Orthoses:  (no brace needed) Other Brace: no brace needed per chart/orders Restrictions Weight Bearing Restrictions: No   Therapy/Group: Individual Therapy   Peter Congo, PT, DPT  10/25/2020, 4:21 PM

## 2020-10-25 NOTE — Progress Notes (Signed)
Occupational Therapy Session Note  Patient Details  Name: Mary Davila MRN: [id number] Date of Birth: 12-01-1955  Today's Date: 10/25/2020 OT Group Time:  - 1100-1200   OT Group Time Calculation: 60 minutes   Skilled Therapeutic Interventions/Progress Updates:    Pt engaged in therapeutic w/c level dance group focusing on patient choice, UE/LE strengthening, salience, activity tolerance, and social participation. Pt was guided through various dance-based exercises involving UEs/LEs and trunk. All music was selected by group members. Emphasis placed on LE NMR and UB strengthening while adhering to back precautions. Pt exhibited high(!) levels of participation during group, actively moving B LEs and modifying exercises as needed. Pt also socially engaged with others, affect bright. At end of session pt was returned to the room by RT.   Therapy Documentation Precautions:  Precautions Precautions: Back,Fall Precaution Comments: Reviewed back precautions Required Braces or Orthoses:  (no brace needed) Other Brace: no brace needed per chart/orders Restrictions Weight Bearing Restrictions: No Pain: no s/s pain during tx   ADL: ADL Eating: Set up Grooming: Setup Where Assessed-Grooming: Bed level Upper Body Bathing: Minimal assistance,Minimal cueing Where Assessed-Upper Body Bathing: Edge of bed Lower Body Bathing: Maximal assistance Where Assessed-Lower Body Bathing: Bed level Upper Body Dressing: Minimal assistance Where Assessed-Upper Body Dressing: Edge of bed Lower Body Dressing: Maximal assistance Where Assessed-Lower Body Dressing: Bed level Toileting: Unable to assess Toilet Transfer: Unable to assess Toilet Transfer Method: Unable to assess Tub/Shower Transfer: Unable to assess      Therapy/Group: Group Therapy  Jameal Razzano A [name] 10/25/2020, 12:44 PM

## 2020-10-25 NOTE — Progress Notes (Signed)
[place] PHYSICAL MEDICINE & REHABILITATION PROGRESS NOTE   Subjective/Complaints: Patient seen sitting up in her chair this AM.  She states she slept well overnight. She denies complaints.   ROS: Denies CP, SOB, N/V/D  Objective:   No results found. No results for input(s): WBC, HGB, HCT, PLT in the last 72 hours. No results for input(s): NA, K, CL, CO2, GLUCOSE, BUN, CREATININE, CALCIUM in the last 72 hours.  Intake/Output Summary (Last 24 hours) at 10/25/2020 1117 Last data filed at 10/25/2020 0858 Gross per 24 hour  Intake 840 ml  Output --  Net 840 ml        Physical Exam: Vital Signs Blood pressure (!) 150/89, pulse 79, temperature 98.2 F (36.8 C), temperature source Oral, resp. rate 19, height 5' 5.5" (1.664 m), weight 71.3 kg, SpO2 99 %. Constitutional: No distress . Vital signs reviewed. HENT: Normocephalic.  Atraumatic. Eyes: EOMI. No discharge. Cardiovascular: No JVD.  RRR. Respiratory: Normal effort.  No stridor.  Bilateral clear to auscultation. GI: Non-distended.  BS +. Skin: Warm and dry.   Back with dressing CDI, not examined today Psych: Normal mood.  Normal behavior. Musc: LE edema. No tenderness in extremities. Neuro: Motor: B/l UE: 5/5 proximal to distal RLE: HF 2-/5, KE 3+/5, 0/5 DF LLE- HF 2-/5, KE 3+/5, 0/5 DF   Assessment/Plan: 1. Functional deficits which require 3+ hours per day of interdisciplinary therapy in a comprehensive inpatient rehab setting.  Physiatrist is providing close team supervision and 24 hour management of active medical problems listed below.  Physiatrist and rehab team continue to assess barriers to discharge/monitor patient progress toward functional and medical goals  Care Tool:  Bathing    Body parts bathed by patient: Right arm,Left arm,Chest,Abdomen,Front perineal area,Right upper leg,Left upper leg,Face   Body parts bathed by helper: Buttocks,Right lower leg,Left lower leg Body parts n/a: Right lower leg,Left  lower leg   Bathing assist Assist Level: Minimal Assistance - Patient > 75%     Upper Body Dressing/Undressing Upper body dressing Upper body dressing/undressing activity did not occur (including orthotics): Safety/medical concerns What is the patient wearing?: Pull over shirt    Upper body assist Assist Level: Supervision/Verbal cueing    Lower Body Dressing/Undressing Lower body dressing      What is the patient wearing?: Pants,Underwear/pull up     Lower body assist Assist for lower body dressing: Moderate Assistance - Patient 50 - 74%     Toileting Toileting Toileting Activity did not occur Press photographer and hygiene only): N/A (no void or bm)  Toileting assist Assist for toileting: Maximal Assistance - Patient 25 - 49%     Transfers Chair/bed transfer  Transfers assist  Chair/bed transfer activity did not occur: Safety/medical concerns  Chair/bed transfer assist level: Contact Guard/Touching assist Chair/bed transfer assistive device:  (Squat pivot)   Locomotion Ambulation   Ambulation assist   Ambulation activity did not occur: Safety/medical concerns          Walk 10 feet activity   Assist  Walk 10 feet activity did not occur: Safety/medical concerns        Walk 50 feet activity   Assist Walk 50 feet with 2 turns activity did not occur: Safety/medical concerns         Walk 150 feet activity   Assist Walk 150 feet activity did not occur: Safety/medical concerns         Walk 10 feet on uneven surface  activity   Assist Walk 10 feet  on uneven surfaces activity did not occur: Safety/medical concerns         Wheelchair     Assist Will patient use wheelchair at discharge?: Yes Type of Wheelchair: Manual    Wheelchair assist level: Supervision/Verbal cueing Max wheelchair distance: 300 ft    Wheelchair 50 feet with 2 turns activity    Assist        Assist Level: Supervision/Verbal cueing   Wheelchair  150 feet activity     Assist      Assist Level: Supervision/Verbal cueing   Blood pressure (!) 150/89, pulse 79, temperature 98.2 F (36.8 C), temperature source Oral, resp. rate 19, height 5' 5.5" (1.664 m), weight 71.3 kg, SpO2 99 %.  Medical Problem List and Plan: 1.  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  Antithrombotics: -DVT/anticoagulation:  Pharmaceutical: Lovenox  Dopplers B/L neg for DVT  Will need to send home on Lovenox for a total of 3 months to prevent DVT/PE              -antiplatelet therapy: N/a 3. Pain Management:   hydrocodone prn  Keppra to 500 mg BID for nerve pain  Started Vit B complex to help with mood  Changed Flexeril to Skelaxin due to severe sedation with Flexeril/Robaxin.  4. Mood: LCSW to follow for evaluation and support.              -antipsychotic agents:  N/A 5. Neuropsych: This patient is capable of making decisions on her own behalf. 6. Skin/Wound Care: Monitor wound for healing.  7. Fluids/Electrolytes/Nutrition: Monitor I/Os.  8.  HTN: Monitor BP   Norvasc 5 stopped   Vitals:   10/25/20 0422 10/25/20 0711  BP: (!) 150/89   Pulse: 79   Resp: 19   Temp: 98.2 F (36.8 C)   SpO2: 98% 99%   Low dose Norvasc and hasn't required Midodrine yet.  9. Hypokalemia:   Kdur 40 meq bid  K+ 4.5 on 2/1, cont to monitor 10. Mild anemia:   Hb 9.5 on 1/31 on 2/5, labs ordered for Monday 11. COPD: Respiratory status stable on Dulera bid. Albuterol prn SOB.  12. Neurogenic bladder:    Improving   ?Bladder spasms vs , ZPP dysesthesia 13. Neurogenic bowel:   Started bowel program this evening- discussed at length with pt about bowel program.  ?Improving 14. At level SCI pain-  Keppra 250 mg BID for nerve pain  Changed Gabapentin to QHS per pt request 15. Low grade fever this AM:  Resolved  U/A on 1/25- (+) but had 30k Ecoli- sensitive to everything- Sx's improving- wait on starting ABX since not 100k EColi.  16. Severe depression 17. Peripheral edema  Dopplers (-) likely due ot Norvasc AND LE weakness combined to cause swelling- needs TEDs.   Added ACE wraps over TEDs  LOS: 18 days A FACE TO FACE EVALUATION WAS PERFORMED  Laddie Math Karis Juba 10/25/2020, 11:17 AM

## 2020-10-25 NOTE — Plan of Care (Signed)
  Problem: Consults Goal: RH SPINAL CORD INJURY PATIENT EDUCATION Description:  See Patient Education module for education specifics.  Outcome: Progressing   Problem: SCI BOWEL ELIMINATION Goal: RH STG MANAGE BOWEL WITH ASSISTANCE Description: STG Manage Bowel with mod Assistance. Outcome: Progressing Goal: RH STG SCI MANAGE BOWEL PROGRAM W/ASSIST OR AS APPROPRIATE Description: STG SCI Manage bowel program with mod assist Outcome: Progressing   Problem: SCI BLADDER ELIMINATION Goal: RH STG SCI MANAGE BLADDER PROGRAM W/ASSISTANCE Description: Manage bladder program with mod assist Outcome: Progressing Goal: RH OTHER STG BLADDER ELIMINATION GOALS W/ASSIST Description: Other STG Bladder Elimination Goals With mod Assistance Outcome: Progressing   Problem: RH PAIN MANAGEMENT Goal: RH STG PAIN MANAGED AT OR BELOW PT'S PAIN GOAL Description: Pain level less than 4 on scale of 0-10 Outcome: Progressing   Problem: RH KNOWLEDGE DEFICIT SCI Goal: RH STG INCREASE KNOWLEDGE OF SELF CARE AFTER SCI Description: Pt will be able to demonstrate understanding of bowel and bladder management following SCI with min assist using handouts provided. Pt will be able to perform own bowel program independently upon discharge.  Outcome: Progressing

## 2020-10-26 NOTE — Plan of Care (Signed)
  Problem: Consults Goal: RH SPINAL CORD INJURY PATIENT EDUCATION Description:  See Patient Education module for education specifics.  Outcome: Progressing   Problem: SCI BOWEL ELIMINATION Goal: RH STG MANAGE BOWEL WITH ASSISTANCE Description: STG Manage Bowel with mod Assistance. Outcome: Progressing Goal: RH STG SCI MANAGE BOWEL PROGRAM W/ASSIST OR AS APPROPRIATE Description: STG SCI Manage bowel program with mod assist Outcome: Progressing   Problem: SCI BLADDER ELIMINATION Goal: RH STG SCI MANAGE BLADDER PROGRAM W/ASSISTANCE Description: Manage bladder program with mod assist Outcome: Progressing Goal: RH OTHER STG BLADDER ELIMINATION GOALS W/ASSIST Description: Other STG Bladder Elimination Goals With mod Assistance Outcome: Progressing   Problem: RH PAIN MANAGEMENT Goal: RH STG PAIN MANAGED AT OR BELOW PT'S PAIN GOAL Description: Pain level less than 4 on scale of 0-10 Outcome: Progressing   Problem: RH KNOWLEDGE DEFICIT SCI Goal: RH STG INCREASE KNOWLEDGE OF SELF CARE AFTER SCI Description: Pt will be able to demonstrate understanding of bowel and bladder management following SCI with min assist using handouts provided. Pt will be able to perform own bowel program independently upon discharge.  Outcome: Progressing   

## 2020-10-26 NOTE — Progress Notes (Signed)
Physical Therapy Session Note  Patient Details  Name: Geralyn Figiel MRN: [id number] Date of Birth: 10-Mar-1956  Today's Date: 10/26/2020 PT Individual Time: 1101-1201 PT Individual Time Calculation (min): 60 min   Short Term Goals: Week 1:  PT Short Term Goal 1 (Week 1): Pt will perform bed mobility with mod A consistently PT Short Term Goal 1 - Progress (Week 1): Met PT Short Term Goal 2 (Week 1): Pt will perform least restrictive transfer with mod A PT Short Term Goal 2 - Progress (Week 1): Met PT Short Term Goal 3 (Week 1): Pt will perform w/c mobility x 100 ft with Supervision PT Short Term Goal 3 - Progress (Week 1): Met PT Short Term Goal 4 (Week 1): Pt will initiate gait training as safe and able PT Short Term Goal 4 - Progress (Week 1): Not met  Skilled Therapeutic Interventions/Progress Updates:  Pt was seen bedside in the am. Pt propelled w/c 150 feet x 2 with B UEs and S. Pt performed sit to stand x 3 with stedy and c/g. Pt performed B LEs exercises, AAROM hip flex and AROM LAQs, 3 sets x 10 reps each as well as static stretching B ankles. Pt returned to room following treatment and left sitting up in w/c with chair alarm in place and all needs within reach.   Therapy Documentation Precautions:  Precautions Precautions: Back,Fall Precaution Comments: Reviewed back precautions Required Braces or Orthoses:  (no brace needed) Other Brace: no brace needed per chart/orders Restrictions Weight Bearing Restrictions: No General:   Pain: Pt c/o 7/10 back pain, medicated prior to treatment.   Therapy/Group: Individual Therapy  Dub Amis 10/26/2020, 12:19 PM

## 2020-10-27 LAB — BASIC METABOLIC PANEL
Anion gap: 11 (ref 5–15)
BUN: 11 mg/dL (ref 8–23)
CO2: 26 mmol/L (ref 22–32)
Calcium: 9.2 mg/dL (ref 8.9–10.3)
Chloride: 104 mmol/L (ref 98–111)
Creatinine, Ser: 0.77 mg/dL (ref 0.44–1.00)
GFR, Estimated: >60 mL/min (ref >60)
Glucose, Bld: 100 mg/dL — ABNORMAL HIGH (ref 70–99)
Potassium: 3.7 mmol/L (ref 3.5–5.1)
Sodium: 141 mmol/L (ref 135–145)

## 2020-10-27 LAB — CBC
HCT: 31.5 % — ABNORMAL LOW (ref 36.0–46.0)
Hemoglobin: 10 g/dL — ABNORMAL LOW (ref 12.0–15.0)
MCH: 28.7 pg (ref 26.0–34.0)
MCHC: 31.7 g/dL (ref 30.0–36.0)
MCV: 90.5 fL (ref 80.0–100.0)
Platelets: 358 10*3/uL (ref 150–400)
RBC: 3.48 MIL/uL — ABNORMAL LOW (ref 3.87–5.11)
RDW: 13.9 % (ref 11.5–15.5)
WBC: 3.7 10*3/uL — ABNORMAL LOW (ref 4.0–10.5)
nRBC: 0 % (ref 0.0–0.2)

## 2020-10-27 NOTE — Progress Notes (Signed)
Physical Therapy Session Note  Patient Details  Name: Mary Davila MRN: [id number] Date of Birth: 04-06-1956  Today's Date: 10/27/2020 PT Individual Time: 1400-1500 PT Individual Time Calculation (min): 60 min   Short Term Goals: Week 3:  PT Short Term Goal 1 (Week 3): Pt will perform bed mobility with min A consistently PT Short Term Goal 2 (Week 3): Pt will be independent with adaptive equipment for bed mobility and transfers. PT Short Term Goal 3 (Week 3): Pt will maintain dynamic sitting balance with close supervision x5 minutes.  Skilled Therapeutic Interventions/Progress Updates:    OT present on arrival, pt care handed off with pt in stedy after using the restroom. Transfer to wc using stedy, pt propelled wc x 200 ft to gym at supervision level with BUE. Pt performed squat pivot transfer wc<> mat table with CGA and VCs for sequencing. Pt performs squat from mat table 3x4, SPT blocked knees to prevent sliding because pt was not wearing shoes due to LE edema. Pt performed STS with stedy x 4 times, VCs for getting head forward to decrease UE load. Pt performed lateral scoots on mat table x 2 each direction with VCs and encouragement to clear bottom from mat. Pt returned to room and requested to use commode, transferring with stedy. Pt left seated on commode with call button in reach, nursing notified.   Therapy Documentation Precautions:  Precautions Precautions: Back,Fall Precaution Comments: Reviewed back precautions Required Braces or Orthoses:  (no brace needed) Other Brace: no brace needed per chart/orders Restrictions Weight Bearing Restrictions: No    Therapy/Group: Individual Therapy  Dyane Dustman, SPT 10/27/2020, 5:36 PM

## 2020-10-27 NOTE — Progress Notes (Signed)
Occupational Therapy Session Note  Patient Details  Name: Mary Davila MRN: [id number] Date of Birth: 1955-11-04  Today's Date: 10/27/2020 OT Individual Time: 1300-1400 OT Individual Time Calculation (min): 60 min    Short Term Goals: Week 3:  OT Short Term Goal 2 (Week 3): Pt will don pants at bed level with min A using AE PRN OT Short Term Goal 3 (Week 3): Pt will perform toileting tasks seated on BSC with mod A for clothing management OT Short Term Goal 4 (Week 3): Pt will complete LB dressing tasks at bed level with min A using AE PRN  Skilled Therapeutic Interventions/Progress Updates:    Session focused on IADL engagement/independence in the home. Focused on kitchen mobility/accessibility, adaptive ways to reach/manage food items, and general safety. She simulated reaching into the fridge, onto stove, and into a dishwasher. Multiple recommendations/cueing provided. Pt then propelled to the laundry room. Discussed adaptive way to reach, transfer, and fold laundry. Pt demo-ed use of reacher to reach into top loading washing machine (like pt has at home) and then transferred to dryer. Pt returned to her room and requested to use the bathroom. Pt completed squat pivot transfer to [facility] over toilet with min A. Min cueing for clothing management with lateral leans. Pt required min A for thorough hygiene posteriorly. Pt very anxious and becoming frustrated with toileting. Pt requesting to use stedy to transfer back to w/c despite edu re progression toward equipment she will only have at home. Pt passed off to PT in stedy.   Therapy Documentation Precautions:  Precautions Precautions: Back,Fall Precaution Comments: Reviewed back precautions Required Braces or Orthoses:  (no brace needed) Other Brace: no brace needed per chart/orders Restrictions Weight Bearing Restrictions: No  Therapy/Group: Individual Therapy  Crissie Reese 10/27/2020, 6:53 AM

## 2020-10-27 NOTE — Progress Notes (Signed)
Physical Therapy Session Note  Patient Details  Name: Mary Davila MRN: [id number] Date of Birth: 1956-01-05  Today's Date: 10/27/2020 PT Individual Time: 3267-1245 PT Individual Time Calculation (min): 73 min   Short Term Goals: Week 2:  PT Short Term Goal 1 (Week 3): Pt will perform bed mobility with min A consistently PT Short Term Goal 2 (Week 3): Pt will be independent with adaptive equipment for bed mobility and transfers. PT Short Term Goal 3 (Week 3): Pt will maintain dynamic sitting balance with close supervision x5 minutes.  Skilled Therapeutic Interventions/Progress Updates:    Patient in w/c and reports pain currently controlled.  Reports had some muscular back pain earlier due to twisting the wrong way.  Propelled w/c to ortho gym.  Performed slide board transfer to car at sedan height with cues for w/c set up and min A for safety with technique/board placement.  Patient cued to bring board out prior to getting legs in. Then out of car back to w/c with cues for sequencing as well and CGA for safety.  Patient able to replace armrest on w/c, assist to obtain legrests, pt able to replace.  Propelled to general gym with S.  Performed SBT to mat with S.  Patient sit to supine with A for LE's.  Rolled to L with min A and pushed to side sit with min A and up to tall kneeling with bench with mod A.  With UE support on bench performed hip extension x 3 bouths of 5 with rest breaks down on elbows in between.  Patient assisted back to supine and performed bridging with A x 10, marching with A x 10.  Stretch to hip flexors laying flat in supine pt c/o pain in groin bilateral so some STW performed to hip flexors.  Also passive stretch to hip extensors, hamstrings and heel cords.  Patient with limited tolerance to hip stretching with previous history of hip replacement.  Hooklying hip abduction with A using yellow t-band for resistance and keeping knees from falling apart.  Supine hip abduction as well  with A all x 10.  Supine to sit min A and scooted to EOB with S.  Sit to squat x 10 then x 5 using w/c in front for UE support and assist to lift hips and stabilize feet.  Scoot pivot to w/c with CGA without board.  Patient propelled to room mod I.  Left up in w/c with alarm belt and pt to call for assist to get to recliner.   Therapy Documentation Precautions:  Precautions Precautions: Back,Fall Precaution Comments: Reviewed back precautions Required Braces or Orthoses:  (no brace needed) Other Brace: no brace needed per chart/orders Restrictions Weight Bearing Restrictions: No  Pain: Pain Assessment Pain Scale: 0-10 Pain Score: 3  Pain Type: Chronic pain Pain Location: Back Pain Orientation: Lower Pain Descriptors / Indicators: Aching Pain Frequency: Intermittent Pain Onset: With Activity Patients Stated Pain Goal: 2 Pain Intervention(s): Medication (See eMAR);Repositioned    Therapy/Group: Individual Therapy  Elray Mcgregor Babb, PT 10/27/2020, 10:24 AM

## 2020-10-27 NOTE — Progress Notes (Signed)
[place] PHYSICAL MEDICINE & REHABILITATION PROGRESS NOTE   Subjective/Complaints:Pt reports still has catch in back, but did take new muscle relaxer yesterday for first time- seems to help- plans on taking 2x/day now to help pain/muscle soreness/catch.   Used TEDs AND ACE wraps 12 hours yesterday- feet were burning because of it, however swelling "resolved" because of it- so happy with that- explained I would wear until done with therapy, then elevate legs/feet in bed.   No sedation from skelaxin.  B/B still going well.   ROS:  Pt denies SOB, abd pain, CP, N/V/C/D, and vision changes   Objective:   No results found. Recent Labs    10/27/20 0551  WBC 3.7*  HGB 10.0*  HCT 31.5*  PLT 358   Recent Labs    10/27/20 0551  NA 141  K 3.7  CL 104  CO2 26  GLUCOSE 100*  BUN 11  CREATININE 0.77  CALCIUM 9.2    Intake/Output Summary (Last 24 hours) at 10/27/2020 1000 Last data filed at 10/27/2020 0745 Gross per 24 hour  Intake 836 ml  Output --  Net 836 ml        Physical Exam: Vital Signs Blood pressure (!) 142/85, pulse 92, temperature 98.4 F (36.9 C), temperature source Oral, resp. rate 18, height 5' 5.5" (1.664 m), weight 69.4 kg, SpO2 99 %. Constitutional: sitting up in bedside chair, appropriate, smiling, used steady with NT help to get pants pulled up, NAD HENT: Normocephalic.  Atraumatic. Eyes: EOMI. No discharge. Cardiovascular: RRR- no JVD Respiratory:CTA B/L- no W/R/R- good air movement. GI: Soft, NT, ND, (+)BS  Skin: Warm and dry.   Back with dressing CDI, not examined today Psych: more appropriate, less anxious. Musc: LE edema is better- has TEDs/ACE wraps in place- is trace so far this AM No tenderness in extremities. Neuro: Motor: B/l UE: 5/5 proximal to distal RLE: HF 2-/5, KE 3+/5, 0/5 DF LLE- HF 2-/5, KE 3+/5, 0/5 DF   Assessment/Plan: 1. Functional deficits which require 3+ hours per day of interdisciplinary therapy in a comprehensive  inpatient rehab setting.  Physiatrist is providing close team supervision and 24 hour management of active medical problems listed below.  Physiatrist and rehab team continue to assess barriers to discharge/monitor patient progress toward functional and medical goals  Care Tool:  Bathing    Body parts bathed by patient: Right arm,Left arm,Chest,Abdomen,Front perineal area,Right upper leg,Left upper leg,Face   Body parts bathed by helper: Buttocks,Right lower leg,Left lower leg Body parts n/a: Right lower leg,Left lower leg   Bathing assist Assist Level: Minimal Assistance - Patient > 75%     Upper Body Dressing/Undressing Upper body dressing Upper body dressing/undressing activity did not occur (including orthotics): Safety/medical concerns What is the patient wearing?: Pull over shirt    Upper body assist Assist Level: Independent    Lower Body Dressing/Undressing Lower body dressing      What is the patient wearing?: Pants,Incontinence brief     Lower body assist Assist for lower body dressing: Total Assistance - Patient < 25%     Toileting Toileting Toileting Activity did not occur (Clothing management and hygiene only): N/A (no void or bm)  Toileting assist Assist for toileting: Moderate Assistance - Patient 50 - 74%     Transfers Chair/bed transfer  Transfers assist  Chair/bed transfer activity did not occur: Safety/medical concerns  Chair/bed transfer assist level: Contact Guard/Touching assist Chair/bed transfer assistive device:  (Squat pivot)   Locomotion Ambulation  Ambulation assist   Ambulation activity did not occur: Safety/medical concerns          Walk 10 feet activity   Assist  Walk 10 feet activity did not occur: Safety/medical concerns        Walk 50 feet activity   Assist Walk 50 feet with 2 turns activity did not occur: Safety/medical concerns         Walk 150 feet activity   Assist Walk 150 feet activity did not  occur: Safety/medical concerns         Walk 10 feet on uneven surface  activity   Assist Walk 10 feet on uneven surfaces activity did not occur: Safety/medical concerns         Wheelchair     Assist Will patient use wheelchair at discharge?: Yes Type of Wheelchair: Manual    Wheelchair assist level: Supervision/Verbal cueing Max wheelchair distance: 150    Wheelchair 50 feet with 2 turns activity    Assist        Assist Level: Supervision/Verbal cueing   Wheelchair 150 feet activity     Assist      Assist Level: Supervision/Verbal cueing   Blood pressure (!) 142/85, pulse 92, temperature 98.4 F (36.9 C), temperature source Oral, resp. rate 18, height 5' 5.5" (1.664 m), weight 69.4 kg, SpO2 99 %.  Medical Problem List and Plan: 1.  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   Continue CIR 2.  Antithrombotics: -DVT/anticoagulation:  Pharmaceutical: Lovenox  Dopplers B/L neg for DVT  Will need to send home on Lovenox for a total of 3 months to prevent DVT/PE              -antiplatelet therapy: N/a 3. Pain Management:   hydrocodone prn  Keppra to 500 mg BID for nerve pain  Started Vit B complex to help with mood  Changed Flexeril to Skelaxin due to severe sedation with Flexeril/Robaxin.   2/7- working much better- con't skelaxin.  4. Mood: LCSW to follow for evaluation and support.              -antipsychotic agents:  N/A 5. Neuropsych: This patient is capable of making decisions on her own behalf. 6. Skin/Wound Care: Monitor wound for healing.  7. Fluids/Electrolytes/Nutrition: Monitor I/Os.  8.  HTN: Monitor BP    Vitals:   10/27/20 0357 10/27/20 0730  BP: (!) 142/85   Pulse: 92   Resp: 18   Temp: 98.4 F (36.9 C)   SpO2: 100%  99%   Low dose Norvasc and hasn't required Midodrine yet.   2/7- BP 140s/80s- no orthostasis- will monitor to make sure BP doesn't go too high on Norvasc 2.5 mg daily.  9. Hypokalemia:   Kdur 40 meq bid  K+ 4.5 on 2/1, cont to monitor  2/7- K+ 3.7- con't regimen 10. Mild anemia:   Hb 9.5 on 1/31 on 2/5, labs ordered for Monday  2/7- Hb 10.0- doing better 11. COPD: Respiratory status stable on Dulera bid. Albuterol prn SOB.  12. Neurogenic bladder:    Improving  2/7- voiding on own OFF Flomax   ?Bladder spasms vs , ZPP dysesthesia 13. Neurogenic bowel:   Started bowel program this evening- discussed at length with pt about bowel program.  ?Improving  2/7- having BMs on  toilet- hasn't required bowel program since last week-  14. At level SCI pain-  Keppra 250 mg BID for nerve pain  Changed Gabapentin to QHS per pt request 15. Low grade fever this AM: Resolved  U/A on 1/25- (+) but had 30k Ecoli- sensitive to everything- Sx's improving- wait on starting ABX since not 100k EColi.  16. Severe depression 17. Peripheral edema  Dopplers (-) likely due ot Norvasc AND LE weakness combined to cause swelling- needs TEDs.   Added ACE wraps over TEDs  2/7- doing better- con't regimen  LOS: 20 days A FACE TO FACE EVALUATION WAS PERFORMED  Cornelious Diven 10/27/2020, 10:00 AM

## 2020-10-27 NOTE — Progress Notes (Signed)
Patient ID: Mary Davila, female   DOB: [birth year], 65 y.o.   MRN: [id number]  SW met with pt in room to discuss d/c recommendations: HH and DME-TTB and DABSC. SW provided pt with HHA list, and informed TTB not covered uninsured/private pay item.  SW to follow-up with pt tomorrow about preference.   Loralee Pacas, MSW, [place] Office: [phone] Cell: [phone] Fax: [phone]

## 2020-10-28 MED ORDER — SALINE SPRAY 0.65 % NA SOLN
2.0000 | Freq: Three times a day (TID) | NASAL | Status: DC
  Administered 2020-10-28 – 2020-11-04 (×8): 2 via NASAL
  Filled 2020-10-28: qty 44

## 2020-10-28 NOTE — Progress Notes (Signed)
Occupational Therapy Session Note  Patient Details  Name: Mary Davila MRN: [id number] Date of Birth: December 30, 1955  Today's Date: 10/28/2020 OT Individual Time: 4098-1191 OT Individual Time Calculation (min): 71 min    Short Term Goals: Week 3:  OT Short Term Goal 2 (Week 3): Pt will don pants at bed level with min A using AE PRN OT Short Term Goal 3 (Week 3): Pt will perform toileting tasks seated on BSC with mod A for clothing management OT Short Term Goal 4 (Week 3): Pt will complete LB dressing tasks at bed level with min A using AE PRN  Skilled Therapeutic Interventions/Progress Updates:    Pt sitting in w/c upon arrival and "ready to take a shower." Pt doffed pants/brief seated in w/c before transfer to shower in bathroom with Stedy. Pt completed bathing at shower level with lateral leans and using long handle sponge. See Care Tool for assist. Pt tranfserred back to w/c and completed dressing at w/c level with sit<>stand in Stedy to pull pants over hips. Pt able to assist with pulling pants over hips while leaning on Stedy for support. Pt used reacher to assist with threading pants. Pt propelled w/c to ortho gym and practiced squat pivot transfers to drop arm BSC with min A fading to CGA with practice. Pt requires min verbal cues for technique and sequencing. Pt practiced transfers X 4. Pt returned to room and remained in w/c with belt alarm activated and all needs within reach.   Therapy Documentation Precautions:  Precautions Precautions: Back,Fall Precaution Comments: Reviewed back precautions Required Braces or Orthoses:  (no brace needed) Other Brace: no brace needed per chart/orders Restrictions Weight Bearing Restrictions: No Pain:  Pt denies pain this morning  Therapy/Group: Individual Therapy  Rich Brave 10/28/2020, 9:35 AM

## 2020-10-28 NOTE — Progress Notes (Signed)
[place] PHYSICAL MEDICINE & REHABILITATION PROGRESS NOTE   Subjective/Complaints:  Pt reports "same old, same old"- practicing wiping her bottom after BM- had a little BM just now- a smear, but LBM yesterday x2 and smear overnight- all on toilet.    Voiding well still.     ROS:   Pt denies SOB, abd pain, CP, N/V/C/D, and vision changes   Objective:   No results found. Recent Labs    10/27/20 0551  WBC 3.7*  HGB 10.0*  HCT 31.5*  PLT 358   Recent Labs    10/27/20 0551  NA 141  K 3.7  CL 104  CO2 26  GLUCOSE 100*  BUN 11  CREATININE 0.77  CALCIUM 9.2    Intake/Output Summary (Last 24 hours) at 10/28/2020 0918 Last data filed at 10/28/2020 0748 Gross per 24 hour  Intake 716 ml  Output --  Net 716 ml        Physical Exam: Vital Signs Blood pressure (!) 141/85, pulse 79, temperature 100 F (37.8 C), temperature source Oral, resp. rate 16, height 5' 5.5" (1.664 m), weight 69 kg, SpO2 98 %. Constitutional: sitting on toilet, required steady to get there, NT in room, NAD HENT: Normocephalic.  Atraumatic. Eyes: EOMI. No discharge. Cardiovascular: RRR- no JVD Respiratory: CTA B/L- no W/R/R- good air movement GI: Soft, NT, ND, (+)BS  Skin: Warm and dry.   Back with dressing CDI, not examined today Psych: more appropriate, less anxious. Musc: LE edema is better- has TEDs/ACE wraps in place- is trace so far this AM No tenderness in extremities. Neuro: Motor: B/l UE: 5/5 proximal to distal RLE: HF 2-/5, KE 3+/5, 0/5 DF LLE- HF 2-/5, KE 3+/5, 0/5 DF   Assessment/Plan: 1. Functional deficits which require 3+ hours per day of interdisciplinary therapy in a comprehensive inpatient rehab setting.  Physiatrist is providing close team supervision and 24 hour management of active medical problems listed below.  Physiatrist and rehab team continue to assess barriers to discharge/monitor patient progress toward functional and medical goals  Care Tool:  Bathing     Body parts bathed by patient: Right arm,Left arm,Chest,Abdomen,Front perineal area,Right upper leg,Left upper leg,Face   Body parts bathed by helper: Buttocks,Right lower leg,Left lower leg Body parts n/a: Right lower leg,Left lower leg   Bathing assist Assist Level: Minimal Assistance - Patient > 75%     Upper Body Dressing/Undressing Upper body dressing Upper body dressing/undressing activity did not occur (including orthotics): Safety/medical concerns What is the patient wearing?: Pull over shirt    Upper body assist Assist Level: Independent    Lower Body Dressing/Undressing Lower body dressing      What is the patient wearing?: Pants,Incontinence brief     Lower body assist Assist for lower body dressing: Total Assistance - Patient < 25%     Toileting Toileting Toileting Activity did not occur (Clothing management and hygiene only): N/A (no void or bm)  Toileting assist Assist for toileting: Moderate Assistance - Patient 50 - 74%     Transfers Chair/bed transfer  Transfers assist  Chair/bed transfer activity did not occur: Safety/medical concerns  Chair/bed transfer assist level: Contact Guard/Touching assist Chair/bed transfer assistive device:  (Squat pivot)   Locomotion Ambulation   Ambulation assist   Ambulation activity did not occur: Safety/medical concerns          Walk 10 feet activity   Assist  Walk 10 feet activity did not occur: Safety/medical concerns  Walk 50 feet activity   Assist Walk 50 feet with 2 turns activity did not occur: Safety/medical concerns         Walk 150 feet activity   Assist Walk 150 feet activity did not occur: Safety/medical concerns         Walk 10 feet on uneven surface  activity   Assist Walk 10 feet on uneven surfaces activity did not occur: Safety/medical concerns         Wheelchair     Assist Will patient use wheelchair at discharge?: Yes Type of Wheelchair: Manual     Wheelchair assist level: Supervision/Verbal cueing Max wheelchair distance: 200    Wheelchair 50 feet with 2 turns activity    Assist        Assist Level: Supervision/Verbal cueing   Wheelchair 150 feet activity     Assist      Assist Level: Supervision/Verbal cueing   Blood pressure (!) 141/85, pulse 79, temperature 100 F (37.8 C), temperature source Oral, resp. rate 16, height 5' 5.5" (1.664 m), weight 69 kg, SpO2 98 %.  Medical Problem List and Plan: 1.  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   Continue CIR 2.  Antithrombotics: -DVT/anticoagulation:  Pharmaceutical: Lovenox  Dopplers B/L neg for DVT  Will need to send home on Lovenox for a total of 3 months to prevent DVT/PE              -antiplatelet therapy: N/a 3. Pain Management:   hydrocodone prn  Keppra to 500 mg BID for nerve pain  Started Vit B complex to help with mood  Changed Flexeril to Skelaxin due to severe sedation with Flexeril/Robaxin.   2/8- working better when takes skelaxin- con't regimen 4. Mood: LCSW to follow for evaluation and support.              -antipsychotic agents:  N/A 5. Neuropsych: This patient is capable of making decisions on her own behalf. 6. Skin/Wound Care: Monitor wound for healing.  7. Fluids/Electrolytes/Nutrition: Monitor I/Os.  8.  HTN: Monitor BP    Vitals:   10/28/20 0436 10/28/20 0801  BP: (!) 141/85   Pulse: 79   Resp:    Temp: 100 F (37.8 C)   SpO2: 100% 98%   Low dose Norvasc and hasn't required Midodrine yet.   2/7- BP 140s/80s- no orthostasis- will monitor to make sure BP doesn't go too high on Norvasc 2.5 mg daily.   2/8- BP 140s/80s again- no orthostasis- con't regimen 9. Hypokalemia:   Kdur 40 meq bid  K+ 4.5 on 2/1, cont to  monitor  2/7- K+ 3.7- con't regimen 10. Mild anemia:   Hb 9.5 on 1/31 on 2/5, labs ordered for Monday  2/7- Hb 10.0- doing better 11. COPD: Respiratory status stable on Dulera bid. Albuterol prn SOB.  12. Neurogenic bladder:    Improving  2/7- voiding on own OFF Flomax   ?Bladder spasms vs , ZPP dysesthesia 13. Neurogenic bowel:   Started bowel program this evening- discussed at length with pt about bowel program.  ?Improving  2/7- having BMs on toilet- hasn't required bowel program since last week-   2/8- having frequent small BMs- con't PO bowel meds 14. At level SCI pain-  Keppra 250 mg BID for nerve pain  Changed  Gabapentin to QHS per pt request 15. Low grade fever this AM: Resolved  U/A on 1/25- (+) but had 30k Ecoli- sensitive to everything- Sx's improving- wait on starting ABX since not 100k EColi.  16. Severe depression 17. Peripheral edema  Dopplers (-) likely due ot Norvasc AND LE weakness combined to cause swelling- needs TEDs.   Added ACE wraps over TEDs  2/7- doing better- con't regimen  LOS: 21 days A FACE TO FACE EVALUATION WAS PERFORMED  Mary Davila 10/28/2020, 9:18 AM

## 2020-10-28 NOTE — Progress Notes (Signed)
Patient ID: Mary Davila, female   DOB: 1956/06/26, 65 y.o.   MRN: [id number]  SW met with pt in room to provide updates on gain made while here in rehab, and changing d/c date from 2/15 to 2/18 to continue to work on self-care/clothing/toileting management care needs. Pt in agreement. Pt to follow-up with SW about HHA preference. Pt would like TTB and DABSC and states will pick up from retail store.   SW ordered DME with [employer] via parachute.   Loralee Pacas, MSW, [place] Office: [phone] Cell: [phone] Fax: [phone]

## 2020-10-28 NOTE — Progress Notes (Signed)
Physical Therapy Session Note  Patient Details  Name: Mary Davila MRN: [id number] Date of Birth: 06/14/1956  Today's Date: 10/28/2020 PT Individual Time: 1015-1100; 1500-1615 PT Individual Time Calculation (min): 45 min and 75 min  Short Term Goals: Week 3:  PT Short Term Goal 1 (Week 3): Pt will perform bed mobility with min A consistently PT Short Term Goal 2 (Week 3): Pt will be independent with adaptive equipment for bed mobility and transfers. PT Short Term Goal 3 (Week 3): Pt will maintain dynamic sitting balance with close supervision x5 minutes.  Skilled Therapeutic Interventions/Progress Updates:    Session 1: Pt received seated in w/c in room, agreeable to PT session. No complaints of pain this date. Manual w/c propulsion up to 150 ft with use of BUE at Supervision level this date. Pt is mod I overall for management of w/c parts with increased time needed. Squat pivot transfer w/c to/from mat table with CGA, cues for sequencing and LE placement during transfer. Session focus on sit to/from long-sit on mat table with use of leg lifter and/or leg loops. Pt initially requires min A for LE management and progresses to close Supervision with cues for utilization of adaptive equipment. Pt reports urge to use the bathroom at end of session. Stedy transfer to elevated BSC over toilet. Pt left seated on toilet with call button in reach, NT notified of pt's location.  Session 2: Pt received seated in w/c with Harrie Jeans, [employer] present for custom seating evaluation. No complaints of pain. Pt able to participate in seating evaluation and discuss wheelchair needs based on current deficits and abilities as well as provide information regarding home environment in order to determine which type of chair will be best fit for her. Manual w/c propulsion 2 x 100 ft with use of BUE at Supervision level. Remainder of session focused on use of standing frame for LE NMR in weight bearing position. Standing  mini-squats 3 x 5 reps with increased depth each round, focus on activation of glutes and quads vs relying on UE to return to upright stance. Static standing in standing frame x 10 min while reaching outside BOS and across midline for cards. Pt left seated in w/c in room with needs in reach, quick release belt and chair alarm in place at end of session.  Therapy Documentation Precautions:  Precautions Precautions: Back,Fall Precaution Comments: Reviewed back precautions Required Braces or Orthoses:  (no brace needed) Other Brace: no brace needed per chart/orders Restrictions Weight Bearing Restrictions: No   Therapy/Group: Individual Therapy   Peter Congo, PT, DPT  10/28/2020, 11:50 AM

## 2020-10-28 NOTE — Patient Care Conference (Signed)
Inpatient RehabilitationTeam Conference and Plan of Care Update Date: 10/28/2020   Time: 11:18 AM    Patient Name: Mary Davila      Medical Record Number: [id number]  Date of Birth: Oct 20, 1955 Sex: Female         Room/Bed: 4M11C/4M11C-01 Payor Info: Payor: [employer] MEDICARE / Plan: [employer] MEDICARE HMO / Product Type: *No Product type* /    Admit Date/Time:  10/07/2020  3:51 PM  Primary Diagnosis:  Acute incomplete paraplegia [facility])  Hospital Problems: Principal Problem:   Acute incomplete paraplegia (HCC) Active Problems:   Lumbar disc herniation with myelopathy   Hypokalemia   Neurogenic bladder   Neurogenic bowel   Post-operative pain   Peripheral edema    Expected Discharge Date: Expected Discharge Date: 11/07/20  Team Members Present: Physician leading conference: Dr. Genice Rouge Care Coodinator Present: Cecile Sheerer, LCSWA;Dan Scearce Marlyne Beards, RN, BSN, CRRN Nurse Present: Otilio Carpen, RN PT Present: Peter Congo, PT OT Present: Roney Mans, OT;Ardis Rowan, COTA PPS Coordinator present : Fae Pippin, SLP     Current Status/Progress Goal Weekly Team Focus  Bowel/Bladder   Pt is continent of b/b. LBM 10/28/20  remain continent of b/b  Q2h toileting/PRN   Swallow/Nutrition/ Hydration             ADL's   bathing at shower level or bed level-min A; LB dressing-mod A; toileting-max A; SB transfers-CGA; squat pivot tranfsers to BSC-min A; toileting-max A  supervision overall w/c level  transfers, LB ADLs, toileting, education   Mobility   bed mobility supervision with leg loops, CGA to stand with stedy, CGA squat pivot, supervision wc mobility  Supervision overall at w/c level  transfers, standing as able, w/c mobility and management, w/c eval, LE NMR,  sitting balance, SCI edu   Communication             Safety/Cognition/ Behavioral Observations            Pain   Pt c/o bilateral leg pain. PRN effective  pain level <4/10  Assess pain Qshift/PRN   Skin   Back  incision- Skin Glue/OTA  Remain free of breakdown and infection  Assess skin qshift/PRN     Discharge Planning:  Pt to have 24/7 care between her dtr and sister. Pt will d/c to her home- no ramp needed since she lives on ground  level apartment.   Team Discussion: Bowels moving frequently, bladder going on her own. Changed Flexeril to Skelaxin due to causing drowsiness. Continent with incontinent episodes. Still not on the bowel program. Will discharge to her sister's home.  Patient on target to meet rehab goals: Min assist with AD and bed features. Seating eval today. Supervision overall with W/C. Working on Acupuncturist during toileting and hygiene. Squat pivot transfers with min assist. Prefers Teds with the ace wraps. Supervision goals at W/C level.  *See Care Plan and progress notes for long and short-term goals.   Revisions to Treatment Plan:  MD added Skelaxin.  Teaching Needs: Family education, medication management, transfer training, ADL training, AD training, endurance training.  Current Barriers to Discharge: Inaccessible home environment, Decreased caregiver support, Home enviroment access/layout, Incontinence, Wound care, Lack of/limited family support, Weight bearing restrictions, Medication compliance and Behavior  Possible Resolutions to Barriers: Continue current medications, skin/wound care, provide emotional support to patient and family.     Medical Summary Current Status: usually, but not always Continent B/B- multiple BMs/day- frequent urination- pain better controlled  Barriers to Discharge: Behavior;Decreased family/caregiver support;Home enviroment  access/layout;Incontinence;Neurogenic Bowel & Bladder;Weight bearing restrictions;Other (comments)  Barriers to Discharge Comments: incomplete paraplegia with B/B issues; family education tomorrow- going to her home since ground level; extedn her to 2/18 for toileting issues Possible Resolutions to  Barriers/Weekly Focus: squat pivot transfers; seating eval this afternoon for w/c- Numotion- working on toileting and hygiene mostly with clothing mgmt- almost CGA;   Continued Need for Acute Rehabilitation Level of Care: The patient requires daily medical management by a physician with specialized training in physical medicine and rehabilitation for the following reasons: Direction of a multidisciplinary physical rehabilitation program to maximize functional independence : Yes Medical management of patient stability for increased activity during participation in an intensive rehabilitation regime.: Yes Analysis of laboratory values and/or radiology reports with any subsequent need for medication adjustment and/or medical intervention. : Yes   I attest that I was present, lead the team conference, and concur with the assessment and plan of the team.   Tennis Must 10/28/2020, 5:19 PM

## 2020-10-29 NOTE — Progress Notes (Signed)
Physical Therapy Session Note  Patient Details  Name: Mary Davila MRN: [id number] Date of Birth: [birth year]  Today's Date: 10/29/2020 PT Individual Time:1000-1130; 5456-2563 PT Individual Time Calculation (min): 90 min and 45 min   Short Term Goals: Week 3:  PT Short Term Goal 1 (Week 3): Pt will perform bed mobility with min A consistently PT Short Term Goal 1 - Progress (Week 3): Met PT Short Term Goal 2 (Week 3): Pt will be independent with adaptive equipment for bed mobility and transfers. PT Short Term Goal 2 - Progress (Week 3): Progressing toward goal PT Short Term Goal 3 (Week 3): Pt will maintain dynamic sitting balance with close supervision x5 minutes. PT Short Term Goal 3 - Progress (Week 3): Met  Skilled Therapeutic Interventions/Progress Updates:    Session 1: Pt received seated in w/c in room, agreeable to PT session. No complaints of pain. Manual w/c propulsion x 150 ft with use of BUE at Supervision level. Pt independent for management of w/c parts with increased time needed. Manual w/c propulsion x 25 ft in ultralight manual wheelchair with use of 5 strokes to propel distance. Squat pivot transfer w/c to/from mat table with CGA to lightweight manual w/c. Pt able to propel 25 ft in lightweight manual wheelchair again with 5 strokes. Pt exhibits good understanding of proper propulsion technique to decrease strain on shoulders. Although pt is able to cover distance in similar amount of strokes pt does note that lightweight chair is heavier than ultralight and reports increased effort required to propel this type of chair. Assessed patient's posture and UE/LE strength while seated EOM in order to complete LMN. Adjusted back rest and handles on wheelchair for improved fit and to allow improved mechanics when propelling chair. Sit to half-stand from mat table x 10 reps with B knees blocked and dycem under LLE to prevent sliding on floor. Pt reports onset of some cramping during  exercise. Returned to room, stedy transfer to toilet. Pt able to continently void once seated on toilet, NT able to assist with pericare. Pt returned to recliner at end of session, left seated in chair with needs in reach.  Session 2: Pt received seated on toilet, setup A for pericare and able to perform clothing management with min A while standing in stedy. Stedy transfer to w/c. Pt's sister and daughter present for hands-on family education session. Pt able to propel herself via w/c to therapy gym at Supervision level. Demonstrated squat pivot transfers and slide board transfers w/c to/from mat table. Each of pt's family members able to perform return demonstration of assisting pt with transfer. Pt is close Supervision with some blocking of knees required for squat pivot and slide board transfers to level surface. Pt is able to direct caregivers on how to safely assist her with transfers. Demonstrated how to safely assist pt with car transfer via slide board with min A required for trunk control. Pt is able to maneuver BLE in/out of car independently. Pt's family members each able to perform return demo of assisting pt with car transfers. Family to return next week for car transfer on car pt will actually d/c home in. Pt handed off to OT at end of session.  Therapy Documentation Precautions:  Precautions Precautions: Back,Fall Precaution Comments: Reviewed back precautions Required Braces or Orthoses:  (no brace needed) Other Brace: no brace needed per chart/orders Restrictions Weight Bearing Restrictions: No   Therapy/Group: Individual Therapy   Excell Seltzer, PT, DPT  10/29/2020, 5:07 PM

## 2020-10-29 NOTE — Progress Notes (Signed)
Occupational Therapy Session Note  Patient Details  Name: Mary Davila MRN: [id number] Date of Birth: 1955-11-13  Today's Date: 10/29/2020 OT Individual Time: 1345-1430 OT Individual Time Calculation (min): 45 min    Short Term Goals: Week 3:  OT Short Term Goal 2 (Week 3): Pt will don pants at bed level with min A using AE PRN OT Short Term Goal 3 (Week 3): Pt will perform toileting tasks seated on BSC with mod A for clothing management OT Short Term Goal 4 (Week 3): Pt will complete LB dressing tasks at bed level with min A using AE PRN  Skilled Therapeutic Interventions/Progress Updates:    Pt's daughter and sister present for education. Pt practiced wc<>EOM squat pivot transfers, EOM<>BSC tranfsers, w/c<>TTB transfers, and w/c<>BSC transfers. All transfers with CGA. Family educated on placing towel under pt when transfering back to w/c from TTB. Pt practiced simulated clothing management sitting on BSC. Pt supervision for w/c setup. Pt returned to room and remained in w/c with CSW and family present. All needs within reach.   Therapy Documentation Precautions:  Precautions Precautions: Back,Fall Precaution Comments: Reviewed back precautions Required Braces or Orthoses:  (no brace needed) Other Brace: no brace needed per chart/orders Restrictions Weight Bearing Restrictions: No  Pain: Pain Assessment Pain Scale: 0-10 Pain Score: 5  Pain Type: Acute pain Pain Location: Leg Pain Orientation: Lower Pain Descriptors / Indicators: Aching Pain Onset: With Activity Pain Intervention(s): Meds prior to therapy  Therapy/Group: Individual Therapy  Rich Brave 10/29/2020, 2:47 PM

## 2020-10-29 NOTE — Progress Notes (Signed)
Physical Therapy Weekly Progress Note  Patient Details  Name: Mary Davila MRN: [id number] Date of Birth: 11/13/1955  Beginning of progress report period: October 22, 2020 End of progress report period: October 29, 2020  Patient has met 2 of 3 short term goals.  Pt is making good progress towards therapy goals overall. She is currently Supervision for bed mobility with use of hospital bed features and AE including leg loops and/or leg lifter. Pt does require some assist to don leg loops at this time. Pt is able to perform squat pivot transfers with CGA progressing to close Supervision. Pt is able to perform car transfers with min A and use of 30" slide board. Once seated in w/c pt requires Supervision at times and is progressing towards mod I level for propulsion up to 300 ft with use of BUE. Pt is also independent for management of w/c parts. Pt is able to stand with CGA to stedy but does not currently exhibit enough LE strength, endurance, or control in order to perform gait initiation.  Patient continues to demonstrate the following deficits muscle weakness, muscle joint tightness and muscle paralysis, decreased cardiorespiratoy endurance, abnormal tone and unbalanced muscle activation and decreased sitting balance and decreased postural control and therefore will continue to benefit from skilled PT intervention to increase functional independence with mobility.  Patient progressing toward long term goals..  Continue plan of care.  PT Short Term Goals Week 3:  PT Short Term Goal 1 (Week 3): Pt will perform bed mobility with min A consistently PT Short Term Goal 1 - Progress (Week 3): Met PT Short Term Goal 2 (Week 3): Pt will be independent with adaptive equipment for bed mobility and transfers. PT Short Term Goal 2 - Progress (Week 3): Progressing toward goal PT Short Term Goal 3 (Week 3): Pt will maintain dynamic sitting balance with close supervision x5 minutes. PT Short Term Goal 3 -  Progress (Week 3): Met Week 4:  PT Short Term Goal 1 (Week 4): =LTG due to ELOS   Therapy Documentation Precautions:  Precautions Precautions: Back,Fall Precaution Comments: Reviewed back precautions Required Braces or Orthoses:  (no brace needed) Other Brace: no brace needed per chart/orders Restrictions Weight Bearing Restrictions: No   Therapy/Group: Individual Therapy   Excell Seltzer, PT, DPT  10/29/2020, 7:39 AM

## 2020-10-29 NOTE — Progress Notes (Signed)
Recreational Therapy Session Note  Patient Details  Name: Mary Davila MRN: [id number] Date of Birth: 1956-04-02 Today's Date: 10/29/2020  Pain: no c/o Skilled Therapeutic Interventions/Progress Updates: Session focused on discharge planning and coping strategies.  Also discussed potential activity modifications once home.  Pt is excited about family education sessions today.  Therapy/Group: Individual Therapy Tabrina Esty 10/29/2020, 4:06 PM

## 2020-10-29 NOTE — Progress Notes (Signed)
Patient ID: Mary Davila, female   DOB: [birth year], 65 y.o.   MRN: [id number]  SW met with pt and family after family education to review discharge recommendations. Pt preferred [facility] is [facility]. SW to order additional DME: transfer board, and hospital bed. Fam edu scheduled again for Wed 2/16 1pm-3pm with dtr and sister again.   SW ordered DME with [employer] via parachute.    Loralee Pacas, MSW, [place] Office: [phone] Cell: [phone] Fax: [phone]

## 2020-10-29 NOTE — Progress Notes (Signed)
Occupational Therapy Session Note  Patient Details  Name: Mary Davila MRN: [id number] Date of Birth: December 28, 1955  Today's Date: 10/29/2020 OT Individual Time: 0900-1000 OT Individual Time Calculation (min): 60 min    Short Term Goals: Week 3:  OT Short Term Goal 2 (Week 3): Pt will don pants at bed level with min A using AE PRN OT Short Term Goal 3 (Week 3): Pt will perform toileting tasks seated on BSC with mod A for clothing management OT Short Term Goal 4 (Week 3): Pt will complete LB dressing tasks at bed level with min A using AE PRN  Skilled Therapeutic Interventions/Progress Updates:    OT intervention with focus on squat pivot transfers EOM<>DABSC, simulated clothing management, w/c setup, w/c<>EOM tranfsers, and w/c mobility to increase independence with BADLs. Squat pivot transfers with CGA. Simlulated clothing management with threaband with supervisoin with lateral leans on EOM and on BSC. Discussed placing BSC beside bed at home for use during night. W/c setup up with min verba cues for safety. Pt propelled w/c to room. Pt remained in w/c with all needs within reach. Belt alarm activated.  Therapy Documentation Precautions:  Precautions Precautions: Back,Fall Precaution Comments: Reviewed back precautions Required Braces or Orthoses:  (no brace needed) Other Brace: no brace needed per chart/orders Restrictions Weight Bearing Restrictions: No  Pain:  Pt denies pain this morning   Therapy/Group: Individual Therapy  Rich Brave 10/29/2020, 12:02 PM

## 2020-10-29 NOTE — Progress Notes (Signed)
[facility] PHYSICAL MEDICINE & REHABILITATION PROGRESS NOTE   Subjective/Complaints:  Doing well- reports bowels doing OK. LBM small one this AM.   Fall risk belt bothering her a lot- making her claustrophobic.       ROS:   Pt denies SOB, abd pain, CP, N/V/C/D, and vision changes   Objective:   No results found. Recent Labs    10/27/20 0551  WBC 3.7*  HGB 10.0*  HCT 31.5*  PLT 358   Recent Labs    10/27/20 0551  NA 141  K 3.7  CL 104  CO2 26  GLUCOSE 100*  BUN 11  CREATININE 0.77  CALCIUM 9.2    Intake/Output Summary (Last 24 hours) at 10/29/2020 1040 Last data filed at 10/29/2020 0932 Gross per 24 hour  Intake 956 ml  Output --  Net 956 ml        Physical Exam: Vital Signs Blood pressure (!) 152/88, pulse 82, temperature 98.2 F (36.8 C), resp. rate 20, height 5' 5.5" (1.664 m), weight 69 kg, SpO2 100 %. Constitutional: sitting on toilet, required steady to get there, NT in room, NAD HENT: Normocephalic.  Atraumatic. Sitting up in bedside chair, appears anxious- has seatbelt in place, NAD Eyes: EOMI. No discharge. Cardiovascular: no JVD, RRR Respiratory: CTA B/L- no W/R/R- good air movement EH:MCNO, NT, ND, (+)BS  Skin: Warm and dry.   Back with dressing CDI, not examined today Psych: appropriate, but anxious about belt- fall belt Musc: LE edema is better- has TEDs/ACE wraps in place- is trace so far this AM- no change today No tenderness in extremities. Neuro: Motor: B/l UE: 5/5 proximal to distal RLE: HF 2-/5, KE 3+/5, 0/5 DF LLE- HF 2-/5, KE 3+/5, 0/5 DF   Assessment/Plan: 1. Functional deficits which require 3+ hours per day of interdisciplinary therapy in a comprehensive inpatient rehab setting.  Physiatrist is providing close team supervision and 24 hour management of active medical problems listed below.  Physiatrist and rehab team continue to assess barriers to discharge/monitor patient progress toward functional and medical  goals  Care Tool:  Bathing    Body parts bathed by patient: Right arm,Left arm,Chest,Abdomen,Front perineal area,Right upper leg,Left upper leg,Right lower leg,Left lower leg,Face   Body parts bathed by helper: Buttocks Body parts n/a: Buttocks   Bathing assist Assist Level: Minimal Assistance - Patient > 75%     Upper Body Dressing/Undressing Upper body dressing Upper body dressing/undressing activity did not occur (including orthotics): Safety/medical concerns What is the patient wearing?: Pull over shirt    Upper body assist Assist Level: Independent    Lower Body Dressing/Undressing Lower body dressing      What is the patient wearing?: Pants,Incontinence brief     Lower body assist Assist for lower body dressing: Total Assistance - Patient < 25%     Toileting Toileting Toileting Activity did not occur (Clothing management and hygiene only): N/A (no void or bm)  Toileting assist Assist for toileting: Moderate Assistance - Patient 50 - 74%     Transfers Chair/bed transfer  Transfers assist  Chair/bed transfer activity did not occur: Safety/medical concerns  Chair/bed transfer assist level: Contact Guard/Touching assist Chair/bed transfer assistive device:  (Squat pivot)   Locomotion Ambulation   Ambulation assist   Ambulation activity did not occur: Safety/medical concerns          Walk 10 feet activity   Assist  Walk 10 feet activity did not occur: Safety/medical concerns  Walk 50 feet activity   Assist Walk 50 feet with 2 turns activity did not occur: Safety/medical concerns         Walk 150 feet activity   Assist Walk 150 feet activity did not occur: Safety/medical concerns         Walk 10 feet on uneven surface  activity   Assist Walk 10 feet on uneven surfaces activity did not occur: Safety/medical concerns         Wheelchair     Assist Will patient use wheelchair at discharge?: Yes Type of Wheelchair:  Manual    Wheelchair assist level: Supervision/Verbal cueing Max wheelchair distance: 150'    Wheelchair 50 feet with 2 turns activity    Assist        Assist Level: Supervision/Verbal cueing   Wheelchair 150 feet activity     Assist      Assist Level: Supervision/Verbal cueing   Blood pressure (!) 152/88, pulse 82, temperature 98.2 F (36.8 C), resp. rate 20, height 5' 5.5" (1.664 m), weight 69 kg, SpO2 100 %.  Medical Problem List and Plan: 1.  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   2/9- d/w nursing- will use pad, so pt doesn't get claustrophobia from fall seat belt-   Continue CIR 2.  Antithrombotics: -DVT/anticoagulation:  Pharmaceutical: Lovenox  Dopplers B/L neg for DVT  Will need to send home on Lovenox for a total of 3 months to prevent DVT/PE              -antiplatelet therapy: N/a 3. Pain Management:   hydrocodone prn  Keppra to 500 mg BID for nerve pain  Started Vit B complex to help with mood  Changed Flexeril to Skelaxin due to severe sedation with Flexeril/Robaxin.   2/8- working better when takes skelaxin- con't regimen  2/9- continuing mainly tylenol- pain controlled per pt, con't regimen 4. Mood: LCSW to follow for evaluation and support.              -antipsychotic agents:  N/A 5. Neuropsych: This patient is capable of making decisions on her own behalf. 6. Skin/Wound Care: Monitor wound for healing.   2/9- back wound almost healed 7. Fluids/Electrolytes/Nutrition: Monitor I/Os.  8.  HTN: Monitor BP    Vitals:   10/28/20 1934 10/29/20 0445  BP: (!) 142/88 (!) 152/88  Pulse: 78 82  Resp: 18 20  Temp: 97.9 F (36.6 C) 98.2 F (36.8 C)  SpO2: 99% 100%   Low dose Norvasc and hasn't required Midodrine yet.   2/7- BP  140s/80s- no orthostasis- will monitor to make sure BP doesn't go too high on Norvasc 2.5 mg daily.   2/8- BP 140s/80s again- no orthostasis- con't regimen  2/9- BP 140s/150s- but when on Norvasc 5 mg, BP goes too low- pt refuses other BP meds.  9. Hypokalemia:   Kdur 40 meq bid  K+ 4.5 on 2/1, cont to monitor  2/7- K+ 3.7- con't regimen 10. Mild anemia:   Hb 9.5 on 1/31 on 2/5, labs ordered for Monday  2/7- Hb 10.0- doing better 11. COPD: Respiratory status stable on Dulera bid. Albuterol prn SOB.  12. Neurogenic bladder:    Improving  2/7- voiding on own OFF Flomax   2/9- voiding on own- basically zero bladder scans post void.   ?  Bladder spasms vs , ZPP dysesthesia 13. Neurogenic bowel:   Started bowel program this evening- discussed at length with pt about bowel program.  ?Improving  2/7- having BMs on toilet- hasn't required bowel program since last week-   2/9- having frequent small BMs- don't think it's a normal bowel, but is going regularly- no accidents lately.  14. At level SCI pain-  Keppra 250 mg BID for nerve pain  Changed Gabapentin to QHS per pt request 15. Low grade fever this AM: Resolved  U/A on 1/25- (+) but had 30k Ecoli- sensitive to everything- Sx's improving- wait on starting ABX since not 100k EColi.  16. Severe depression 17. Peripheral edema  Dopplers (-) likely due ot Norvasc AND LE weakness combined to cause swelling- needs TEDs.   Added ACE wraps over TEDs  2/7- doing better- con't regimen  LOS: 22 days A FACE TO FACE EVALUATION WAS PERFORMED  Quoc Tome 10/29/2020, 10:40 AM

## 2020-10-30 NOTE — Progress Notes (Signed)
Occupational Therapy Session Note  Patient Details  Name: Mary Davila MRN: [id number] Date of Birth: 04-Aug-1956  Today's Date: 10/30/2020 OT Individual Time: 8657-8469 OT Individual Time Calculation (min): 74 min    Short Term Goals: Week 4:  OT Short Term Goal 1 (Week 4): STG=LTG secondary to ELOS  Skilled Therapeutic Interventions/Progress Updates:    Pt up in recliner to start session.  Co-tx with recreational therapy for par of session.  Pt worked on removing her gripper socks and ace bandages with min guard assist in order to work on donning her shoes.  Min assist was needed to donn the right in order to maintain balance while placing the heel in without use of the shoe funnel but with use of the reacher.  She was then able to complete donning the left shoe with use of the shoe funnel with supervision.  She needed total assist for tying shoes.  She then completed squat pivot transfer to the wheelchair with min assist and then propelled herself down to the therapy gym with modified independent.  She transferred to the therapy mat with min assist and focused on sitting balance while completing ball toss with recreational therapist and OT.  No significant LOB noted with catching and tossing the volleyball.  Had her sit on a balance disk as well to make it more challenging and she was able to perform with min guard assist.  Progressed to having her work on Journalist, newspaper with use of a theraband and lateral leans side to side.  She performed with supervision for 3 repetitions pulling it up over her hips and then working it back down.  Finished mat work with simulation of supine to sit and sit to supine.  She currently needs min instructional cueing and supervision for supine to sit with min assist for sit to supine while adhering to her back precautions.  Returned to the wheelchair with min assist squat pivot and then back to the recliner at the same level.  She was left with alarm pad  in place and with the call button and phone in reach.    Therapy Documentation Precautions:  Precautions Precautions: Back,Fall Precaution Booklet Issued: No Precaution Comments: Reviewed back precautions Required Braces or Orthoses:  (no brace needed) Other Brace: no brace needed per chart/orders Restrictions Weight Bearing Restrictions: No  Pain: Pain Assessment Pain Scale: Faces Pain Score: 0-No pain ADL: See Care Tool Section for some details of mobility and selfcare  Therapy/Group: Individual Therapy  Solene Hereford OTR/L 10/30/2020, 5:13 PM

## 2020-10-30 NOTE — Progress Notes (Signed)
Occupational Therapy Weekly Progress Note  Patient Details  Name: Mary Davila MRN: [id number] Date of Birth: 02-26-1956  Beginning of progress report period: October 23, 2020 End of progress report period: October 30, 2020  Patient has met 3 of 3 short term goals.Pt is making steady progress with BADLs and functional transfers. UB bathing/dressing at w/c level with supervision. LB bathing at w/c level/ bed level/shower level with min A for buttocks. SB transfers to w/c/bed and TTB/DABSC with CGA. Squat pivot transfers w/c<>EOM/EOB and DABSC with CGA. Pt requires min verbal cues for w/c setup in preparation for transfers. Pt's daughter and sister have been present for therapy sessions and have scheduled another education session for 2/16.   Patient continues to demonstrate the following deficits: muscle weakness, decreased cardiorespiratoy endurance, decr sensation and decreased standing balance, decreased balance strategies and difficulty maintaining precautions and therefore will continue to benefit from skilled OT intervention to enhance overall performance with BADL and Reduce care partner burden.  Patient progressing toward long term goals..  Continue plan of care.  OT Short Term Goals Week 3:  OT Short Term Goal 1 - Progress (Week 3): Met OT Short Term Goal 2 (Week 3): Pt will don pants at bed level with min A using AE PRN OT Short Term Goal 2 - Progress (Week 3): Met OT Short Term Goal 3 (Week 3): Pt will perform toileting tasks seated on BSC with mod A for clothing management OT Short Term Goal 3 - Progress (Week 3): Met OT Short Term Goal 4 (Week 3): Pt will complete LB dressing tasks at bed level with min A using AE PRN Week 4:  OT Short Term Goal 1 (Week 4): STG=LTG secondary to ELOS   Leroy Libman 10/30/2020, 6:40 AM

## 2020-10-30 NOTE — Progress Notes (Signed)
Physical Therapy Session Note  Patient Details  Name: Mary Davila MRN: [id number] Date of Birth: 15-Aug-1956  Today's Date: 10/30/2020 PT Individual Time: 1005-1100 PT Individual Time Calculation (min): 55 min   Short Term Goals: Week 4:  PT Short Term Goal 1 (Week 4): =LTG due to ELOS  Skilled Therapeutic Interventions/Progress Updates: Pt presented in w/c agreeable to therapy. Pt denies pain during session, but requesting tylenol at end of session with nsg notified . Pt propelled to rehab gym with mod I and performed SB transfer with supervision. Pt was able to set up SB with only verbal cues. Pt participated in sit to modified stand using Carley Hammed walker. Pt was able to stand a total of 8 times with x4 times picking up elbows off walker (maintaining support at hands). Pt was able to extend B knees to near full extension without locking. Pt maintained standing ~10 second bouts but requiring extended rest breaks between bouts. Once completed pt performed SB transfer back to w/c with supervision and required minA for removing SB. Pt propelled back to room and requested to use bathroom with pt performing Stedy transfer to toilet. Pt left at toilet with call bell within reach and nsg/NT notified of pt's disposition.      Therapy Documentation Precautions:  Precautions Precautions: Back,Fall Precaution Comments: Reviewed back precautions Required Braces or Orthoses:  (no brace needed) Other Brace: no brace needed per chart/orders Restrictions Weight Bearing Restrictions: No General:   Vital Signs: Therapy Vitals Temp: 98 F (36.7 C) Temp Source: Oral Pulse Rate: 78 Resp: 18 BP: (!) 150/89 Patient Position (if appropriate): Sitting Oxygen Therapy SpO2: 99 % O2 Device: Room Air    Therapy/Group: Individual Therapy  Zeanna Sunde  Flemon Kelty, PTA  10/30/2020, 4:11 PM

## 2020-10-30 NOTE — Progress Notes (Signed)
Recreational Therapy Session Note  Patient Details  Name: Mary Davila MRN: [id number] Date of Birth: 1956-02-02 Today's Date: 10/30/2020  Pain: no c/o Skilled Therapeutic Interventions/Progress Updates: Session focused on activity tolerance & dynamic sitting balance EOM and then sitting on dyna disc with close supervision. Pt discussed feeling depressed last night and wishing to speak with LRT for emotional support, feels this is med related.  Has addressed with MD.  Therapy/Group: Co-Treatment  With OT  Jovonni Borquez 10/30/2020, 4:32 PM

## 2020-10-30 NOTE — Progress Notes (Signed)
Occupational Therapy Session Note  Patient Details  Name: Mary Davila MRN: [id number] Date of Birth: 11-28-1955  Today's Date: 10/30/2020 OT Individual Time: 0800-0900 OT Individual Time Calculation (min): 60 min    Short Term Goals: Week 4:  OT Short Term Goal 1 (Week 4): STG=LTG secondary to ELOS  Skilled Therapeutic Interventions/Progress Updates:    OT intervention with focus on sit<>stand in [place]; toileting, LB dressing, w/c mobility, and sit<>stand from EOM. Sit<>stand in [place] with supervision. Pt supported self on forearms for clothing management and hygiene after BM. Pt threaded pants using reacher and pulled pants over hips when standing in [place]. Pt requires assistance donning shoes. W/c mobility with supervision. W/c setup with supervision. Squat pivot transfers w/c<>EOM with CGA. Sit<>stand from EOM with chair placed in front to facilitate use of BUE to stand-max A. Pt returned to room and reamined in w/c with all needs within reach. Seat alarm activated.  Therapy Documentation Precautions:  Precautions Precautions: Back,Fall Precaution Comments: Reviewed back precautions Required Braces or Orthoses:  (no brace needed) Other Brace: no brace needed per chart/orders Restrictions Weight Bearing Restrictions: No  Pain: Pain Assessment Pain Scale: 0-10 Pain Score: 7  Pain Location: Back Pain Orientation: Lower Pain Descriptors / Indicators: Aching Patients Stated Pain Goal: 3 Pain Intervention(s): premedicate;Repositioned   Therapy/Group: Individual Therapy  Rich Brave 10/30/2020, 12:17 PM

## 2020-10-30 NOTE — Progress Notes (Signed)
[facility] PHYSICAL MEDICINE & REHABILITATION PROGRESS NOTE   Subjective/Complaints:  Pt reports doing OK- resp therapy here for doing breathing treatments/inhalers.  York Spaniel is jittery and depressed/cries at night- thinks due to Keppra or Paxil- asking [place] to hold them both to see if it's the meds- [place] Spaniel discussed it with nursing last night, but it's not documented, unfortunately.     ROS:   Pt denies SOB, abd pain, CP, N/V/C/D, and vision changes    Objective:   No results found. No results for input(s): WBC, HGB, HCT, PLT in the last 72 hours. No results for input(s): NA, K, CL, CO2, GLUCOSE, BUN, CREATININE, CALCIUM in the last 72 hours.  Intake/Output Summary (Last 24 hours) at 10/30/2020 1029 Last data filed at 10/30/2020 0859 Gross per 24 hour  Intake 480 ml  Output --  Net 480 ml        Physical Exam: Vital Signs Blood pressure (!) 153/99, pulse 77, temperature 98 F (36.7 C), temperature source Oral, resp. rate 16, height 5' 5.5" (1.664 m), weight 69.1 kg, SpO2 99 %. Constitutional: sitting up in manual w/c, reps therapy in room, NAD HENT: Normocephalic.  Atraumatic.  Eyes: EOMI. No discharge. Cardiovascular: RRR- no JVD Respiratory: slightly decreased at bases B/L, but good air movement and no w/r/r currently.  GI:  Soft, NT, ND, (+)BS  Skin: Warm and dry.   Back with dressing CDI, not examined today Psych: anxious about "jittery feeling".  Musc: no TEDs/ ACE wraps in place today- asking to go without ACE so can get shoes on.  No tenderness in extremities. Neuro: Motor: B/l UE: 5/5 proximal to distal RLE: HF 2-/5, KE 3+/5, 0/5 DF LLE- HF 2-/5, KE 3+/5, 0/5 DF   Assessment/Plan: 1. Functional deficits which require 3+ hours per day of interdisciplinary therapy in a comprehensive inpatient rehab setting.  Physiatrist is providing close team supervision and 24 hour management of active medical problems listed below.  Physiatrist and rehab team continue to  assess barriers to discharge/monitor patient progress toward functional and medical goals  Care Tool:  Bathing    Body parts bathed by patient: Right arm,Left arm,Chest,Abdomen,Front perineal area,Right upper leg,Left upper leg,Right lower leg,Left lower leg,Face   Body parts bathed by helper: Buttocks Body parts n/a: Buttocks   Bathing assist Assist Level: Minimal Assistance - Patient > 75%     Upper Body Dressing/Undressing Upper body dressing Upper body dressing/undressing activity did not occur (including orthotics): Safety/medical concerns What is the patient wearing?: Pull over shirt    Upper body assist Assist Level: Independent    Lower Body Dressing/Undressing Lower body dressing      What is the patient wearing?: Pants,Incontinence brief     Lower body assist Assist for lower body dressing: Total Assistance - Patient < 25%     Toileting Toileting Toileting Activity did not occur (Clothing management and hygiene only): N/A (no void or bm)  Toileting assist Assist for toileting: Moderate Assistance - Patient 50 - 74%     Transfers Chair/bed transfer  Transfers assist  Chair/bed transfer activity did not occur: Safety/medical concerns  Chair/bed transfer assist level: Contact Guard/Touching assist Chair/bed transfer assistive device:  (Squat pivot)   Locomotion Ambulation   Ambulation assist   Ambulation activity did not occur: Safety/medical concerns          Walk 10 feet activity   Assist  Walk 10 feet activity did not occur: Safety/medical concerns        Walk 50  feet activity   Assist Walk 50 feet with 2 turns activity did not occur: Safety/medical concerns         Walk 150 feet activity   Assist Walk 150 feet activity did not occur: Safety/medical concerns         Walk 10 feet on uneven surface  activity   Assist Walk 10 feet on uneven surfaces activity did not occur: Safety/medical concerns          Wheelchair     Assist Will patient use wheelchair at discharge?: Yes Type of Wheelchair: Manual    Wheelchair assist level: Supervision/Verbal cueing Max wheelchair distance: 300'    Wheelchair 50 feet with 2 turns activity    Assist        Assist Level: Supervision/Verbal cueing   Wheelchair 150 feet activity     Assist      Assist Level: Supervision/Verbal cueing   Blood pressure (!) 153/99, pulse 77, temperature 98 F (36.7 C), temperature source Oral, resp. rate 16, height 5' 5.5" (1.664 m), weight 69.1 kg, SpO2 99 %.  Medical Problem List and Plan: 1.  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   2/9- d/w nursing- will use pad, so pt doesn't get claustrophobia from fall seat belt-   Continue CIR 2.  Antithrombotics: -DVT/anticoagulation:  Pharmaceutical: Lovenox  Dopplers B/L neg for DVT  Will need to send home on Lovenox for a total of 3 months to prevent DVT/PE              -antiplatelet therapy: N/a 3. Pain Management:   hydrocodone prn  Keppra to 500 mg BID for nerve pain  Started Vit B complex to help with mood  Changed Flexeril to Skelaxin due to severe sedation with Flexeril/Robaxin.   2/8- working better when takes skelaxin- con't regimen  2/9- continuing mainly tylenol- pain controlled per pt, con't regimen  2/10- having jitteriness from she thinks, the Keppra- will hold for a few days, per her request- also, pt refused keppra this AM-  4. Mood: LCSW to follow for evaluation and support.   2/10- on Paxil- will NOT stop for now- just keppra.              -antipsychotic agents:  N/A 5. Neuropsych: This patient is capable of making decisions on her own behalf. 6. Skin/Wound Care: Monitor wound for healing.   2/9- back wound almost  healed 7. Fluids/Electrolytes/Nutrition: Monitor I/Os.  8.  HTN: Monitor BP    Vitals:   10/30/20 0503 10/30/20 0757  BP: (!) 153/99   Pulse: 77   Resp: 16   Temp: 98 F (36.7 C)   SpO2: 100% 99%   Low dose Norvasc and hasn't required Midodrine yet.   2/7- BP 140s/80s- no orthostasis- will monitor to make sure BP doesn't go too high on Norvasc 2.5 mg daily.   2/8- BP 140s/80s again- no orthostasis- con't regimen  2/9- BP 140s/150s- but when on Norvasc 5 mg, BP goes too low- pt refuses other BP meds.  2/10- BP high again this AM 153/99- higher than normal for her- if doesn't come down tomorrow, will have to increase Norvasc.   9. Hypokalemia:   Kdur 40 meq bid  K+ 4.5 on 2/1, cont to monitor  2/7- K+ 3.7- con't regimen 10.  Mild anemia:   Hb 9.5 on 1/31 on 2/5, labs ordered for Monday  2/7- Hb 10.0- doing better 11. COPD: Respiratory status stable on Dulera bid. Albuterol prn SOB.   2/10- resp treatments keeping things stable 12. Neurogenic bladder:    Improving  2/7- voiding on own OFF Flomax   2/9- voiding on own- basically zero bladder scans post void.   ?Bladder spasms vs , ZPP dysesthesia 13. Neurogenic bowel:   Started bowel program this evening- discussed at length with pt about bowel program.  ?Improving  2/7- having BMs on toilet- hasn't required bowel program since last week-   2/9- having frequent small BMs- don't think it's a normal bowel, but is going regularly- no accidents lately.  14. At level SCI pain-  Keppra 250 mg BID for nerve pain  Changed Gabapentin to QHS per pt request  2/10- will hold Keppra x 3 days to see if the cause of being jittery- if so, will need to try another medicine-  15. Low grade fever this AM: Resolved  U/A on 1/25- (+) but had 30k Ecoli- sensitive to everything- Sx's improving- wait on starting ABX since not 100k EColi.  16. Severe depression 17. Peripheral edema  Dopplers (-) likely due ot Norvasc AND LE weakness combined to cause  swelling- needs TEDs.   Added ACE wraps over TEDs  2/7- doing better- con't regimen  LOS: 23 days A FACE TO FACE EVALUATION WAS PERFORMED  Romario Tith 10/30/2020, 10:29 AM

## 2020-10-30 NOTE — Progress Notes (Addendum)
Patient ID: Mary Davila, female   DOB: August 03, 1956, 65 y.o.   MRN: [id number]  SW faxed referral to [facility] (p:[phone]/f:[phone]). *SW spoke with Shae/Intake who reported referral was accepted. SW informed pt on above.   SW received FMLA forms for pt dtr [place]. SW provided forms to PA.  Cecile Sheerer, MSW, LCSWA Office: [phone] Cell: [phone] Fax: [phone]

## 2020-10-31 MED ORDER — POLYETHYLENE GLYCOL 3350 17 G PO PACK: 17.0000 g | PACK | Freq: Every day | ORAL | Status: DC | PRN

## 2020-10-31 MED ORDER — AMLODIPINE BESYLATE 5 MG PO TABS
5.0000 mg | ORAL_TABLET | Freq: Every day | ORAL | Status: DC
  Administered 2020-11-01 – 2020-11-02 (×2): 5 mg via ORAL
  Filled 2020-10-31 (×2): qty 1

## 2020-10-31 MED ORDER — SENNOSIDES-DOCUSATE SODIUM 8.6-50 MG PO TABS: 2.0000 | ORAL_TABLET | Freq: Every evening | ORAL | Status: DC | PRN

## 2020-10-31 NOTE — Progress Notes (Signed)
Occupational Therapy Session Note  Patient Details  Name: Mary Davila MRN: [id number] Date of Birth: 06-25-1956  Today's Date: 10/31/2020 OT Individual Time: 1415-1445 OT Individual Time Calculation (min): 30 min   Short Term Goals: Week 4:  OT Short Term Goal 1 (Week 4): STG=LTG secondary to ELOS  Skilled Therapeutic Interventions/Progress Updates:    Pt greeted in the recliner, requesting to just "talk" during tx due to fatigue from previous therapies. OT printed her off an energy conservation/work simplification packet and we discussed functional implications during ADL/IADL engagement at home. Pt is very motivated to be as independent as possible at home in regards to stated tasks, very collaborative during discussion and reports she already has a lap tray to use for the w/c to use during light snack prep. She remained sitting in the recliner at close of session. Tx focus placed on pt education.   Therapy Documentation Precautions:  Precautions Precautions: Back,Fall Precaution Booklet Issued: No Precaution Comments: Reviewed back precautions Required Braces or Orthoses:  (no brace needed) Other Brace: no brace needed per chart/orders Restrictions Weight Bearing Restrictions: No Vital Signs: Therapy Vitals Temp: 99.4 F (37.4 C) Temp Source: Oral Pulse Rate: 89 Resp: 20 BP: (!) 168/90 Oxygen Therapy SpO2: 100 % O2 Device: Room Air Pain: no c/o pain during tx   ADL: ADL Eating: Set up Grooming: Setup Where Assessed-Grooming: Bed level Upper Body Bathing: Minimal assistance,Minimal cueing Where Assessed-Upper Body Bathing: Edge of bed Lower Body Bathing: Maximal assistance Where Assessed-Lower Body Bathing: Bed level Upper Body Dressing: Minimal assistance Where Assessed-Upper Body Dressing: Edge of bed Lower Body Dressing: Maximal assistance Where Assessed-Lower Body Dressing: Bed level Toileting: Unable to assess Toilet Transfer: Unable to assess Toilet  Transfer Method: Unable to assess Tub/Shower Transfer: Unable to assess      Therapy/Group: Individual Therapy  Kiyanna Biegler A Ygnacio Fecteau 10/31/2020, 3:17 PM

## 2020-10-31 NOTE — Progress Notes (Signed)
Physical Therapy Session Note  Patient Details  Name: Mary Davila MRN: [id number] Date of Birth: 02-28-1956  Today's Date: 10/31/2020 PT Individual Time: 1305-1400 PT Individual Time Calculation (min): 55 min   Short Term Goals: Week 4:  PT Short Term Goal 1 (Week 4): =LTG due to ELOS  Skilled Therapeutic Interventions/Progress Updates: Pt presented at toilet agreeable to therapy. Pt states has been feeling "jittery" recently and does not feel safe performing own peri-care at this time. Pt performed STS in [place] and PTA performed peri-care total A. Pt was able to provide assist to pull brief and pants over hips. Pt then transported to w/c where pt discussed may be possible increased anxiety as she was able to feel BLE swell as she did not have a chance tot elevate this am. Per pt vitals were checked prior to transfer to toilet and are as noted below. PTA removed shoes and ACE wrapped BLE per pt request. Pt agreeable to go outside for environmental changes which may help disposition. Pt propelled to 4C elevators and was able to propel into elevator but required minA at uneven surface to propel out. Pt transported to [facility] entrance patio. Pt was appreciative of fresh air and sunshine and was able to propel bouts of >239f on uneven surfaces/slopes with supervision overall. [facility] and thresholds with supervision and propelled back to elevators. Pt was able to propel out of non-service elevators and back to room with supervision. Performed [place] transfer w/c to recliner and pt repositioned to comfort. Pt left in recliner at end of session with call bell within reach and needs met.      Therapy Documentation Precautions:  Precautions Precautions: Back,Fall Precaution Booklet Issued: No Precaution Comments: Reviewed back precautions Required Braces or Orthoses:  (no brace needed) Other Brace: no brace needed per chart/orders Restrictions Weight Bearing  Restrictions: No General:   Vital Signs: Therapy Vitals Temp: 99.4 F (37.4 C) Temp Source: Oral Pulse Rate: 89 Resp: 20 BP: (!) 168/90 Oxygen Therapy SpO2: 100 % O2 Device: Room Air    Therapy/Group: Individual Therapy  Kody Vigil  Merrilyn Legler, PTA  10/31/2020, 4:10 PM

## 2020-10-31 NOTE — Progress Notes (Signed)
Physical Therapy Session Note  Patient Details  Name: Mary Davila MRN: [id number] Date of Birth: [birth year]  Today's Date: 10/31/2020 PT Individual Time: 0900-0940 PT Individual Time Calculation (min): 40 min   Short Term Goals: Week 4:  PT Short Term Goal 1 (Week 4): =LTG due to ELOS  Skilled Therapeutic Interventions/Progress Updates:    Pt greeted seated in w/c, agreeable to therapy. No reports of pain but reports mild fatigue from busy morning. Pt propelled herself mod I in w/c from her room to ortho gym. Pt able to manage w/c parts with supervision including leg rests and arm rests. She positioned herself appropriately to prepare for squat<>pivot transfer from w/c to mat table which she did with CGA. Performed repeated 1/2 stands from lowered mat table with supervision, working on weight bearing through legs and arm strengthening. Then performed lateral scoots L<>R along length of mat table, x3.5 bouts. Squat<>pivot back to her w/c with CGA and placed in standing frame. Used standing frame to assist with upright standing where she tolerated for a total of while completing UE activities/exercises including cone reaching (midline, upward, lateral) and arm raises to 2.5lb ankle weight over her wrists. Also spent some time educating her on importance of pressure relief techniques while seated in w/c, specifically the chair push up to raise bottom from chair. Educated her on frequency and duration and she demonstrated good ability to do this. Pt propelled herself mod I back to her room, ~163ft, and remained seated in w/c at end of session with needs in reach.  Therapy Documentation Precautions:  Precautions Precautions: Back,Fall Precaution Booklet Issued: No Precaution Comments: Reviewed back precautions Required Braces or Orthoses:  (no brace needed) Other Brace: no brace needed per chart/orders Restrictions Weight Bearing Restrictions: No  Therapy/Group: Individual  Therapy  Kendarius Vigen P Tiffancy Moger PT 10/31/2020, 7:43 AM

## 2020-10-31 NOTE — Progress Notes (Signed)
[place] PHYSICAL MEDICINE & REHABILITATION PROGRESS NOTE   Subjective/Complaints:  Pt reports slept better last night and night prior- she thinks because Keppra held- not jittery anymore.   Doing much better- lifted R foot at ankle (DF) and R toes also moving which is new  Peeing better as well.    ROS:   Pt denies SOB, abd pain, CP, N/V/C/D, and vision changes    Objective:   No results found. No results for input(s): WBC, HGB, HCT, PLT in the last 72 hours. No results for input(s): NA, K, CL, CO2, GLUCOSE, BUN, CREATININE, CALCIUM in the last 72 hours.  Intake/Output Summary (Last 24 hours) at 10/31/2020 1315 Last data filed at 10/31/2020 0800 Gross per 24 hour  Intake 600 ml  Output --  Net 600 ml        Physical Exam: Vital Signs Blood pressure (!) 168/90, pulse 89, temperature 99.4 F (37.4 C), temperature source Oral, resp. rate 20, height 5' 5.5" (1.664 m), weight 67 kg, SpO2 100 %. Constitutional: sitting up in manual w/c in room, appropriate, brushing teeth, grooming, NAD HENT: Normocephalic.  Atraumatic.  Eyes: EOMI. No discharge. Cardiovascular: RRR Respiratory: CTA B/L- no W/R/R- good air movement GI:  Soft, NT, ND, (+)BS  Skin: Warm and dry.   Back with dressing CDI, not examined today Psych: much calmer- no jitteriness this AM [place]c: no TEDs/ ACE wraps in place today- asking to go without ACE so can get shoes on.  No tenderness in extremities. Neuro: Motor: B/l UE: 5/5 proximal to distal RLE: HF 2-/5, KE 3+/5, 2-/5 DF which is new; can also wiggle toes some which is also new LLE- HF 2-/5, KE 3+/5, 0/5 DF   Assessment/Plan: 1. Functional deficits which require 3+ hours per day of interdisciplinary therapy in a comprehensive inpatient rehab setting.  Physiatrist is providing close team supervision and 24 hour management of active medical problems listed below.  Physiatrist and rehab team continue to assess barriers to discharge/monitor patient  progress toward functional and medical goals  Care Tool:  Bathing    Body parts bathed by patient: Right arm,Left arm,Chest,Abdomen,Front perineal area,Right upper leg,Left upper leg,Right lower leg,Left lower leg,Face   Body parts bathed by helper: Buttocks Body parts n/a: Buttocks   Bathing assist Assist Level: Minimal Assistance - Patient > 75%     Upper Body Dressing/Undressing Upper body dressing Upper body dressing/undressing activity did not occur (including orthotics): Safety/medical concerns What is the patient wearing?: Pull over shirt    Upper body assist Assist Level: Independent    Lower Body Dressing/Undressing Lower body dressing      What is the patient wearing?: Pants,Incontinence brief     Lower body assist Assist for lower body dressing: Total Assistance - Patient < 25%     Toileting Toileting Toileting Activity did not occur (Clothing management and hygiene only): N/A (no void or bm)  Toileting assist Assist for toileting: Moderate Assistance - Patient 50 - 74%     Transfers Chair/bed transfer  Transfers assist  Chair/bed transfer activity did not occur: Safety/medical concerns  Chair/bed transfer assist level: Contact Guard/Touching assist Chair/bed transfer assistive device:  (Squat pivot)   Locomotion Ambulation   Ambulation assist   Ambulation activity did not occur: Safety/medical concerns          Walk 10 feet activity   Assist  Walk 10 feet activity did not occur: Safety/medical concerns        Walk 50 feet activity  Assist Walk 50 feet with 2 turns activity did not occur: Safety/medical concerns         Walk 150 feet activity   Assist Walk 150 feet activity did not occur: Safety/medical concerns         Walk 10 feet on uneven surface  activity   Assist Walk 10 feet on uneven surfaces activity did not occur: Safety/medical concerns         Wheelchair     Assist Will patient use wheelchair at  discharge?: Yes Type of Wheelchair: Manual    Wheelchair assist level: Supervision/Verbal cueing Max wheelchair distance: 300'    Wheelchair 50 feet with 2 turns activity    Assist        Assist Level: Supervision/Verbal cueing   Wheelchair 150 feet activity     Assist      Assist Level: Supervision/Verbal cueing   Blood pressure (!) 168/90, pulse 89, temperature 99.4 F (37.4 C), temperature source Oral, resp. rate 20, height 5' 5.5" (1.664 m), weight 67 kg, SpO2 100 %.  Medical Problem List and Plan: 1.  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   2/9- d/w nursing- will use pad, so pt doesn't get claustrophobia from fall seat belt-   Continue CIR 2.  Antithrombotics: -DVT/anticoagulation:  Pharmaceutical: Lovenox  Dopplers B/L neg for DVT  Will need to send home on Lovenox for a total of 3 months to prevent DVT/PE              -antiplatelet therapy: N/a 3. Pain Management:   hydrocodone prn  Keppra to 500 mg BID for nerve pain  Started Vit B complex to help with mood  Changed Flexeril to Skelaxin due to severe sedation with Flexeril/Robaxin.   2/10- having jitteriness from she thinks, the Keppra- will hold for a few days, per her request- also, pt refused keppra this AM-   2/11- Keppra stopped 4. Mood: LCSW to follow for evaluation and support.   2/10- on Paxil- will NOT stop for now- just keppra.              -antipsychotic agents:  N/A 5. Neuropsych: This patient is capable of making decisions on her own behalf. 6. Skin/Wound Care: Monitor wound for healing.   2/9- back wound almost healed 7. Fluids/Electrolytes/Nutrition: Monitor I/Os.  8.  HTN: Monitor BP    Vitals:   10/31/20 0716 10/31/20 1259  BP:  (!) 168/90  Pulse:  89  Resp:  20   Temp:  99.4 F (37.4 C)  SpO2: 99% 100%   Low dose Norvasc and hasn't required Midodrine yet.   2/7- BP 140s/80s- no orthostasis- will monitor to make sure BP doesn't go too high on Norvasc 2.5 mg daily.   2/8- BP 140s/80s again- no orthostasis- con't regimen  2/9- BP 140s/150s- but when on Norvasc 5 mg, BP goes too low- pt refuses other BP meds.  2/10- BP high again this AM 153/99- higher than normal for her- if doesn't come down tomorrow, will have to increase Norvasc.    2/11- BP 168/90 - will increase Norvasc to 5 mg and monitor closely.  9. Hypokalemia:   Kdur 40 meq bid  K+ 4.5 on 2/1, cont to monitor  2/7- K+ 3.7- con't regimen 10. Mild anemia:   Hb  9.5 on 1/31 on 2/5, labs ordered for Monday  2/7- Hb 10.0- doing better 11. COPD: Respiratory status stable on Dulera bid. Albuterol prn SOB.   2/10- resp treatments keeping things stable 12. Neurogenic bladder:    Improving  2/7- voiding on own OFF Flomax   2/9- voiding on own- basically zero bladder scans post void.   ?Bladder spasms vs , ZPP dysesthesia 13. Neurogenic bowel:   Started bowel program this evening- discussed at length with pt about bowel program.  ?Improving  2/7- having BMs on toilet- hasn't required bowel program since last week-   2/9- having frequent small BMs- don't think it's a normal bowel, but is going regularly- no accidents lately.  14. At level SCI pain-  Keppra 250 mg BID for nerve pain  Changed Gabapentin to QHS per pt request  2/10- will hold Keppra x 3 days to see if the cause of being jittery- if so, will need to try another medicine-   2/11- pt thinks Keppra cause of jitteriness- will see how pain goes off it- if gets worse, might need something else for nerve pain.  15. Low grade fever this AM: Resolved  U/A on 1/25- (+) but had 30k Ecoli- sensitive to everything- Sx's improving- wait on starting ABX since not 100k EColi.  16. Severe depression 17. Peripheral edema  Dopplers (-) likely due  ot Norvasc AND LE weakness combined to cause swelling- needs TEDs.   Added ACE wraps over TEDs  2/7- doing better- con't regimen  LOS: 24 days A FACE TO FACE EVALUATION WAS PERFORMED  Reinaldo Helt 10/31/2020, 1:15 PM

## 2020-10-31 NOTE — Consult Note (Addendum)
Neuropsychological Consultation   Patient:   Mary Davila   DOB:   [birth year]  MR Number:  [id number]  Location:  [facility] [address] CENTER [address] [id number] [place] [address] Dept: [phone] Loc: [phone]           Date of Service:   10/31/2020  Start Time:   10 AM End Time:   11 AM  Provider/Observer:  Arley Phenix, Psy.D.       Clinical Neuropsychologist       Billing Code/Service: [id number]  Chief Complaint:    Terena Bohan is a 65 year old female with a history of hypertension, OA/DDD who underwent L1-L2 microdiscectomy at outpatient surgery center on 10/03/2019.  Patient developed significant weakness bilateral lower extremity postoperatively with numbness/tingling right lower extremity.  Patient transferred to [facility] for work-up.  MRI spine showed large central disc extrusion with mass-effect on spinal cord with severe left L4/L5 stenosis.  Patient with dense paraplegia and symptoms felt to be due to recurrent/residual migration of the disc at L1 with edema.  Patient taken back to the OR for L1 laminectomy/decompression on 1/14.  Postoperatively the patient continues to be limited by bilateral lower extremity weakness with foot drop, lack sensation bilateral lower extremity, dizziness with standing attempts and steady.  Patient has continued to make progress now that she is on CIR and is working stand pivot transfers.  Patient with recent improvements in toe movements in her right leg but continues to be hampered by swelling, lower extremity weakness etc.  Reason for Service:  Patient was referred for neuropsychological consultation due to coping and adjustment issues with continued but improving paraplegia.  Below see HPI for the current admission for greater detail.  HPI: Mary Davila is a 65 year old female with history of HTN, OA/DDD who underwent L1-L2 microdisectomy at outpatient surgery on 10/03/19   but developed significant weakness BLE post op with numbness/tingling RLE. She was transferred to [facility] for work up. MRI spine showed large central disc extrusion with mass effect on spinal cord,  now with severe left L4/L5 foraminal stenosis and incidental note of massively distended urinary bladder. Patient with dense paraplegia and symptoms felt to be due to recurrent/residual migration of disc at L1 with edema. She was taken back to OR for L1 laminectomy with complete facetectomy for decompression of thecal sac on 01/14 by Dr. Conchita Paris.   Post op continues to be limited by BLE weakness with foot drop, lacks sensation BLE, dizziness with standing attempts in [place] as well as groin pain with neuropathy. Foley in place and no documented BM. Therapy has been ongoing and work on balance at EOB. She continues to have impairment in mobility and ADLs. CIR recommended due to functional decline.   Current Status:  Patient was alert and oriented sitting up in wheelchair.  Patient in good mood state and reports that while she is struggled with unexpected significant loss of function and adjusting to dense paraplegia that she has been seen improvements and remains motivated for therapeutic interventions.  Patient describes some difficulty she had with various medication attempts including pain medications that she did not tolerate well as well as what she felt was an adverse response to an SNRI medication which has since been discontinued.  Patient reports that once they were able to adjust her medications to once that she was not having typical response to that her pain and mood have been much better.  Patient continues  to work hard in therapies and remains motivated.  She and her family have a appropriate plan for discharge and the patient denies any anxiety or worry associated with future discharge.  Behavioral Observation: Nupur Hohman  presents as a 65 y.o.-year-old Right African American Female who appeared her  stated age. her dress was Appropriate and she was Well Groomed and her manners were Appropriate to the situation.  her participation was indicative of Appropriate and Attentive behaviors.  There were physical disabilities noted.  she displayed an appropriate level of cooperation and motivation.     Interactions:    Active Appropriate and Attentive  Attention:   within normal limits and attention span and concentration were age appropriate  Memory:   within normal limits; recent and remote memory intact  Visuo-spatial:  not examined  Speech (Volume):  normal  Speech:   normal; normal  Thought Process:  Coherent and Relevant  Though Content:  WNL; not suicidal and not homicidal  Orientation:   person, place, time/date and situation  Judgment:   Good  Planning:   Good  Affect:    Appropriate  Mood:    Dysphoric  Insight:   Good  Intelligence:   normal   Medical History:   Past Medical History:  Diagnosis Date  . Arthritis   . Asthma   . Constipation   . Hypertension   . Nocturia          Patient Active Problem List   Diagnosis Date Noted  . Hypokalemia   . Neurogenic bladder   . Neurogenic bowel   . Post-operative pain   . Peripheral edema   . 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 Acute incomplete paraplegia (HCC) 10/07/2020  . Lower extremity weakness 10/02/2020     Psychiatric History:  No prior psychiatric history  Family Med/Psych History:  Family History  Problem Relation Age of Onset  . Stroke Mother   . Diabetes Mother   . Bone cancer Brother     Impression/DX:  Mary Davila is a 65 year old female with a history of hypertension, OA/DDD who underwent L1-L2 microdiscectomy at outpatient surgery center on 10/03/2019.  Patient developed significant weakness bilateral lower extremity postoperatively with numbness/tingling right lower extremity.  Patient transferred to [facility] for work-up.  MRI spine showed large central disc extrusion with  mass-effect on spinal cord with severe left L4/L5 stenosis.  Patient with dense paraplegia and symptoms felt to be due to recurrent/residual migration of the disc at L1 with edema.  Patient taken back to the OR for L1 laminectomy/decompression on 1/14.  Postoperatively the patient continues to be limited by bilateral lower extremity weakness with foot drop, lack sensation bilateral lower extremity, dizziness with standing attempts and steady.  Patient has continued to make progress now that she is on CIR and is working stand pivot transfers.  Patient with recent improvements in toe movements in her right leg but continues to be hampered by swelling, lower extremity weakness etc.  Patient was alert and oriented sitting up in wheelchair.  Patient in good mood state and reports that while she is struggled with unexpected significant loss of function and adjusting to dense paraplegia that she has been seen improvements and remains motivated for therapeutic interventions.  Patient describes some difficulty she had with various medication attempts including pain medications that she did not tolerate well as well as what she felt was an adverse response to an SNRI medication which has since been discontinued.  Patient reports that  once they were able to adjust her medications to once that she was not having typical response to that her pain and mood have been much better.  Patient continues to work hard in therapies and remains motivated.  She and her family have a appropriate plan for discharge and the patient denies any anxiety or worry associated with future discharge.   Disposition/Plan:  Today we worked with adjustment and coping with significant change in functional status.  Patient reports that her mood has been improved and that she is coping better and seen functional gains have helped her immensely as of recently.  Diagnosis:    Paraplegia        Electronically Signed   _______________________ Arley Phenix, Psy.D. Clinical Neuropsychologist

## 2020-10-31 NOTE — Progress Notes (Signed)
Patient ID: Mary Davila, female   DOB: [birth year], 65 y.o.   MRN: [id number]   Covering for primary SW, Auria  FMLA forms dropped off to patient.  [place], [place] [phone]

## 2020-10-31 NOTE — Progress Notes (Signed)
Recreational Therapy Session Note  Patient Details  Name: Inioluwa Boulay MRN: [id number] Date of Birth: 1956-02-04 Today's Date: 10/31/2020  Pain: no c/o Skilled Therapeutic Interventions/Progress Updates: Pt in good spirits today and reported a good night rest.  Pt verbalized excitement about upcoming discharge.  Discussion also included home modifications that had been made and/or recommended to aid in safety as well as success once home.  Therapy/Group: Individual Therapy Kabe Mckoy 10/31/2020, 12:21 PM

## 2020-10-31 NOTE — Progress Notes (Signed)
Occupational Therapy Session Note  Patient Details  Name: Mary Davila MRN: [id number] Date of Birth: [birth year]  Today's Date: 10/31/2020 OT Individual Time: 0800-0900 OT Individual Time Calculation (min): 60 min    Short Term Goals: Week 4:  OT Short Term Goal 1 (Week 4): STG=LTG secondary to ELOS  Skilled Therapeutic Interventions/Progress Updates:    Pt resting in w/c upon arrival. OT intervention with focus on sit<>stand in [place], standing balance in [place], bathing at shower level, dressing with sit<>stand from w/c, w/c mobility, and activity tolerance to increase independence with BADLs. Sit<>stand in [place] with CGA and heavy reliance on BUE. Standing in [place] with heavy reliance on forward posture on B forearms. Bathing at shower level with supervision using lateral leans for buttocks and sponge for BLE. Pt initiated pulling pants over hips using lateral leans in w/c but experienced difficulty due to space limitations. Recommended pulling pants up seated EOB to facilitate improved lateral leans. Pt propelled w/c to othro gym and propelled w/c up/down ramp without assistance. Pt returned to room and remained in w/c with all needs within reach. Seat alarm activated.  Therapy Documentation Precautions:  Precautions Precautions: Back,Fall Precaution Booklet Issued: No Precaution Comments: Reviewed back precautions Required Braces or Orthoses:  (no brace needed) Other Brace: no brace needed per chart/orders Restrictions Weight Bearing Restrictions: No Pain:  Pt denies pain  Therapy/Group: Individual Therapy  Rich Brave 10/31/2020, 9:05 AM

## 2020-11-01 NOTE — Progress Notes (Signed)
Occupational Therapy Session Note  Patient Details  Name: Mary Davila MRN: [id number] Date of Birth: [birth year]  Today's Date: 11/01/2020 OT Individual Time: 1300-1357 OT Individual Time Calculation (min): 57 min   Skilled Therapeutic Interventions/Progress Updates:    Pt greeted in the recliner, requesting to use the restroom. Stedy utilized for transfer, pt completing sit<stand with close supervision, CGA for controlled descent onto transfer surfaces. Pt required Mod A for clothing mgt while using the Stedy, completed perihygiene (B+B void) herself in sitting and standing positions, heavy use of the knee block on the lift equipment. Handwashing completed while semi perched in front of the sink to continue working on LE NMR via weightbearing. She then transferred to the w/c where LEs were ACE wrapped. Pt next self propelled to the outdoor courtyard of the hospital, focus placed on w/c negotiation over flooring of different textures and slopes, pt able to maneuver w/c on/off elevators with supervision. While outdoors in the sunshine, guided pt through gentle UB stretches with yoga influence. Strongly encouraged pt to routinely stretch to prevent UB strain/injury due to increased demands placed on UB for functional tasks and transfers. We also discussed having a raised flower bed at home however pt reports being in an apartment complex and unable to make this happen. She reports gardening as being a hobby of hers. Pt was then escorted back the room via w/c and transferred back to the recliner via Stedy. ACE wraps removed. Pt remained in the recliner at end of session with chair alarm set.   Therapy Documentation Precautions:  Precautions Precautions: Back,Fall Precaution Booklet Issued: No Precaution Comments: Reviewed back precautions Required Braces or Orthoses:  (no brace needed) Other Brace: no brace needed per chart/orders Restrictions Weight Bearing Restrictions: No Vital Signs: Therapy  Vitals Temp: 98.5 F (36.9 C) Temp Source: Oral Pulse Rate: 80 Resp: 19 BP: (!) 150/81 Patient Position (if appropriate): Sitting Oxygen Therapy SpO2: 100 % O2 Device: Room Air Pain: at end of session pt reported she was going to ask the RN for tylenol after OT left  Pain Assessment Pain Scale: 0-10 Pain Score: 0-No pain Pain Type: Acute pain Pain Location: Shoulder Pain Descriptors / Indicators: Aching Pain Frequency: Occasional Pain Onset: On-going Pain Intervention(s): Medication (See eMAR) ADL: ADL Eating: Set up Grooming: Setup Where Assessed-Grooming: Bed level Upper Body Bathing: Minimal assistance,Minimal cueing Where Assessed-Upper Body Bathing: Edge of bed Lower Body Bathing: Maximal assistance Where Assessed-Lower Body Bathing: Bed level Upper Body Dressing: Minimal assistance Where Assessed-Upper Body Dressing: Edge of bed Lower Body Dressing: Maximal assistance Where Assessed-Lower Body Dressing: Bed level Toileting: Unable to assess Toilet Transfer: Unable to assess Toilet Transfer Method: Unable to assess Tub/Shower Transfer: Unable to assess      Therapy/Group: Individual Therapy  [name] A [name] 11/01/2020, 3:52 PM

## 2020-11-01 NOTE — Progress Notes (Signed)
[place] PHYSICAL MEDICINE & REHABILITATION PROGRESS NOTE   Subjective/Complaints:  Continues to sleep quite well. Feels great this morning  ROS: Patient denies fever, rash, sore throat, blurred vision, nausea, vomiting, diarrhea, cough, shortness of breath or chest pain, joint or back pain, headache, or mood change.     Objective:   No results found. No results for input(s): WBC, HGB, HCT, PLT in the last 72 hours. No results for input(s): NA, K, CL, CO2, GLUCOSE, BUN, CREATININE, CALCIUM in the last 72 hours.  Intake/Output Summary (Last 24 hours) at 11/01/2020 1019 Last data filed at 11/01/2020 0833 Gross per 24 hour  Intake 700 ml  Output --  Net 700 ml        Physical Exam: Vital Signs Blood pressure (!) 155/89, pulse 79, temperature 98.6 F (37 C), resp. rate 20, height 5' 5.5" (1.664 m), weight 67.6 kg, SpO2 100 %. Constitutional: No distress . Vital signs reviewed. HEENT: EOMI, oral membranes moist Neck: supple Cardiovascular: RRR without murmur. No JVD    Respiratory/Chest: CTA Bilaterally without wheezes or rales. Normal effort    GI/Abdomen: BS +, non-tender, non-distended Ext: no clubbing, cyanosis, or edema Psych: pleasant and cooperative Skin: Warm and dry.   Back incision CDI  Musc: limbs non-tender Neuro: Motor: B/l UE: 5/5 proximal to distal RLE: HF 2-/5, KE 3+/5, 2-/5 PF,DF. Wiggles toes LLE- HF 2-/5, KE 3+/5, 0/5 DF   Assessment/Plan: 1. Functional deficits which require 3+ hours per day of interdisciplinary therapy in a comprehensive inpatient rehab setting.  Physiatrist is providing close team supervision and 24 hour management of active medical problems listed below.  Physiatrist and rehab team continue to assess barriers to discharge/monitor patient progress toward functional and medical goals  Care Tool:  Bathing    Body parts bathed by patient: Right arm,Left arm,Chest,Abdomen,Front perineal area,Right upper leg,Left upper leg,Right  lower leg,Left lower leg,Face   Body parts bathed by helper: Buttocks Body parts n/a: Buttocks   Bathing assist Assist Level: Minimal Assistance - Patient > 75%     Upper Body Dressing/Undressing Upper body dressing Upper body dressing/undressing activity did not occur (including orthotics): Safety/medical concerns What is the patient wearing?: Pull over shirt    Upper body assist Assist Level: Independent    Lower Body Dressing/Undressing Lower body dressing      What is the patient wearing?: Pants,Incontinence brief     Lower body assist Assist for lower body dressing: Total Assistance - Patient < 25%     Toileting Toileting Toileting Activity did not occur (Clothing management and hygiene only): N/A (no void or bm)  Toileting assist Assist for toileting: Moderate Assistance - Patient 50 - 74%     Transfers Chair/bed transfer  Transfers assist  Chair/bed transfer activity did not occur: Safety/medical concerns  Chair/bed transfer assist level: Contact Guard/Touching assist Chair/bed transfer assistive device:  (Squat pivot)   Locomotion Ambulation   Ambulation assist   Ambulation activity did not occur: Safety/medical concerns          Walk 10 feet activity   Assist  Walk 10 feet activity did not occur: Safety/medical concerns        Walk 50 feet activity   Assist Walk 50 feet with 2 turns activity did not occur: Safety/medical concerns         Walk 150 feet activity   Assist Walk 150 feet activity did not occur: Safety/medical concerns         Walk 10 feet on uneven  surface  activity   Assist Walk 10 feet on uneven surfaces activity did not occur: Safety/medical concerns         Wheelchair     Assist Will patient use wheelchair at discharge?: Yes Type of Wheelchair: Manual    Wheelchair assist level: Supervision/Verbal cueing Max wheelchair distance: 300'    Wheelchair 50 feet with 2 turns  activity    Assist        Assist Level: Supervision/Verbal cueing   Wheelchair 150 feet activity     Assist      Assist Level: Supervision/Verbal cueing   Blood pressure (!) 155/89, pulse 79, temperature 98.6 F (37 C), resp. rate 20, height 5' 5.5" (1.664 m), weight 67.6 kg, SpO2 100 %.  Medical Problem List and Plan: 1.  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   2/9- d/w nursing- will use pad, so pt doesn't get claustrophobia from fall seat belt-   Continue CIR 2.  Antithrombotics: -DVT/anticoagulation:  Pharmaceutical: Lovenox  Dopplers B/L neg for DVT  Will need to send home on Lovenox for a total of 3 months to prevent DVT/PE              -antiplatelet therapy: N/a 3. Pain Management:   hydrocodone prn  Keppra to 500 mg BID for nerve pain  Started Vit B complex to help with mood  Changed Flexeril to Skelaxin due to severe sedation with Flexeril/Robaxin.   2/10- having jitteriness from she thinks, the Keppra- will hold for a few days, per her request- also, pt refused keppra this AM-   2/11- Keppra stopped 4. Mood: LCSW to follow for evaluation and support.   2/12- continue paxil, mood better off keppra             -antipsychotic agents:  N/A 5. Neuropsych: This patient is capable of making decisions on her own behalf. 6. Skin/Wound Care: Monitor wound for healing.     back wound healing nicely 7. Fluids/Electrolytes/Nutrition: Monitor I/Os.  8.  HTN: Monitor BP    Vitals:   11/01/20 0756 11/01/20 0903  BP: (!) 161/91 (!) 155/89  Pulse: 91 79  Resp:    Temp:    SpO2:     Low dose Norvasc and hasn't required Midodrine yet.   2/7- BP 140s/80s- no orthostasis- will monitor to make sure BP doesn't go too high on Norvasc 2.5 mg daily.   2/8- BP  140s/80s again- no orthostasis- con't regimen  2/9- BP 140s/150s- but when on Norvasc 5 mg, BP goes too low- pt refuses other BP meds.  2/10- BP high again this AM 153/99- higher than normal for her- if doesn't come down tomorrow, will have to increase Norvasc.    2/11- BP 168/90 - will increase Norvasc to 5 mg and monitor closely.   2/12 may need further titration--observe today 9. Hypokalemia:   Kdur 40 meq bid  K+ 4.5 on 2/1, cont to monitor  2/7- K+ 3.7- con't regimen 10. Mild anemia:   Hb 9.5 on 1/31 on 2/5, labs ordered for Monday  2/7- Hb 10.0- doing better 11. COPD: Respiratory status stable on Dulera bid. Albuterol prn SOB.   2/10- resp treatments keeping things stable 12. Neurogenic bladder:    Improving  2/7- voiding on own OFF Flomax   2/9-  voiding on own- basically zero bladder scans post void.   ?Bladder spasms vs , ZPP dysesthesia 13. Neurogenic bowel:   Started bowel program this evening- discussed at length with pt about bowel program.  ?Improving  2/7- having BMs on toilet- hasn't required bowel program since last week-   2/9-12 continues to have frequent small BMs, continent 14. At level SCI pain-  Keppra 250 mg BID for nerve pain  Changed Gabapentin to QHS per pt request  2/10- will hold Keppra x 3 days to see if the cause of being jittery- if so, will need to try another medicine-   2/12- pt off keppra now, anxiety better. No new reports of pain   -monitor.  15. Low grade fever this AM: Resolved  U/A on 1/25- (+) but had 30k Ecoli- sensitive to everything- Sx's improving- wait on starting ABX since not 100k EColi.  16. Severe depression 17. Peripheral edema  Dopplers (-) likely due ot Norvasc AND LE weakness combined to cause swelling- needs TEDs.   Added ACE wraps over TEDs  2/7- doing better- con't regimen  LOS: 25 days A FACE TO FACE EVALUATION WAS PERFORMED  Ranelle Oyster 11/01/2020, 10:19 AM

## 2020-11-02 MED ORDER — AMLODIPINE BESYLATE 5 MG PO TABS
5.0000 mg | ORAL_TABLET | Freq: Once | ORAL | Status: AC
  Administered 2020-11-02: 5 mg via ORAL
  Filled 2020-11-02: qty 1

## 2020-11-02 MED ORDER — AMLODIPINE BESYLATE 10 MG PO TABS
10.0000 mg | ORAL_TABLET | Freq: Every day | ORAL | Status: DC
  Administered 2020-11-03 – 2020-11-07 (×5): 10 mg via ORAL
  Filled 2020-11-02 (×5): qty 1

## 2020-11-02 NOTE — Progress Notes (Signed)
[place] PHYSICAL MEDICINE & REHABILITATION PROGRESS NOTE   Subjective/Complaints:  Up in chair. Feels that she might have "overdone it" in PT and is having some pain in her right low back. Feels tight  ROS: Patient denies fever, rash, sore throat, blurred vision, nausea, vomiting, diarrhea, cough, shortness of breath or chest pain,  headache, or mood change.    Objective:   No results found. No results for input(s): WBC, HGB, HCT, PLT in the last 72 hours. No results for input(s): NA, K, CL, CO2, GLUCOSE, BUN, CREATININE, CALCIUM in the last 72 hours.  Intake/Output Summary (Last 24 hours) at 11/02/2020 0852 Last data filed at 11/02/2020 0846 Gross per 24 hour  Intake 720 ml  Output --  Net 720 ml        Physical Exam: Vital Signs Blood pressure (!) 153/87, pulse 99, temperature 98.1 F (36.7 C), temperature source Oral, resp. rate 18, height 5' 5.5" (1.664 m), weight 67.9 kg, SpO2 98 %. Constitutional: No distress . Vital signs reviewed. HEENT: EOMI, oral membranes moist Neck: supple Cardiovascular: RRR without murmur. No JVD    Respiratory/Chest: CTA Bilaterally without wheezes or rales. Normal effort    GI/Abdomen: BS +, non-tender, non-distended Ext: no clubbing, cyanosis, or edema Psych: pleasant and cooperative Skin: Warm and dry.   Back incision CDI.  Musc: pain with palpation in right low back, tender along posterior obliques, lower lat Neuro: Motor: B/l UE: 5/5 proximal to distal RLE: HF 2-/5, KE 3+/5, 2-/5 PF,DF. Wiggles toes LLE- HF 2-/5, KE 3+/5, 0/5 DF--stable   Assessment/Plan: 1. Functional deficits which require 3+ hours per day of interdisciplinary therapy in a comprehensive inpatient rehab setting.  Physiatrist is providing close team supervision and 24 hour management of active medical problems listed below.  Physiatrist and rehab team continue to assess barriers to discharge/monitor patient progress toward functional and medical goals  Care  Tool:  Bathing    Body parts bathed by patient: Right arm,Left arm,Chest,Abdomen,Front perineal area,Right upper leg,Left upper leg,Right lower leg,Left lower leg,Face   Body parts bathed by helper: Buttocks Body parts n/a: Buttocks   Bathing assist Assist Level: Minimal Assistance - Patient > 75%     Upper Body Dressing/Undressing Upper body dressing Upper body dressing/undressing activity did not occur (including orthotics): Safety/medical concerns What is the patient wearing?: Pull over shirt    Upper body assist Assist Level: Independent    Lower Body Dressing/Undressing Lower body dressing      What is the patient wearing?: Pants,Incontinence brief     Lower body assist Assist for lower body dressing: Total Assistance - Patient < 25%     Toileting Toileting Toileting Activity did not occur Press photographer and hygiene only): N/A (no void or bm)  Toileting assist Assist for toileting: Dependent - Patient 0% (using Stedy)     Transfers Chair/bed transfer  Transfers assist  Chair/bed transfer activity did not occur: Safety/medical concerns  Chair/bed transfer assist level: Contact Guard/Touching assist Chair/bed transfer assistive device:  (Squat pivot)   Locomotion Ambulation   Ambulation assist   Ambulation activity did not occur: Safety/medical concerns          Walk 10 feet activity   Assist  Walk 10 feet activity did not occur: Safety/medical concerns        Walk 50 feet activity   Assist Walk 50 feet with 2 turns activity did not occur: Safety/medical concerns         Walk 150 feet activity  Assist Walk 150 feet activity did not occur: Safety/medical concerns         Walk 10 feet on uneven surface  activity   Assist Walk 10 feet on uneven surfaces activity did not occur: Safety/medical concerns         Wheelchair     Assist Will patient use wheelchair at discharge?: Yes Type of Wheelchair: Manual     Wheelchair assist level: Supervision/Verbal cueing Max wheelchair distance: 300'    Wheelchair 50 feet with 2 turns activity    Assist        Assist Level: Supervision/Verbal cueing   Wheelchair 150 feet activity     Assist      Assist Level: Supervision/Verbal cueing   Blood pressure (!) 153/87, pulse 99, temperature 98.1 F (36.7 C), temperature source Oral, resp. rate 18, height 5' 5.5" (1.664 m), weight 67.9 kg, SpO2 98 %.  Medical Problem List and Plan: 1.  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   2/9- d/w nursing- will use pad, so pt doesn't get claustrophobia from fall seat belt-   Continue CIR 2.  Antithrombotics: -DVT/anticoagulation:  Pharmaceutical: Lovenox  Dopplers B/L neg for DVT  Will need to send home on Lovenox for a total of 3 months to prevent DVT/PE              -antiplatelet therapy: N/a 3. Pain Management:   hydrocodone prn  Keppra to 500 mg BID for nerve pain  Started Vit B complex to help with mood  Changed Flexeril to Skelaxin due to severe sedation with Flexeril/Robaxin.   2/10- having jitteriness from she thinks, the Keppra- will hold for a few days, per her request- also, pt refused keppra this AM-   2/11- Keppra stopped  2/13 add kpad for right LBP, likely muscle strain vs referred from spine 4. Mood: LCSW to follow for evaluation and support.   2/12- continue paxil, mood better off keppra             -antipsychotic agents:  N/A 5. Neuropsych: This patient is capable of making decisions on her own behalf. 6. Skin/Wound Care: Monitor wound for healing.     back wound healing nicely 7. Fluids/Electrolytes/Nutrition: Monitor I/Os.  8.  HTN: Monitor BP    Vitals:   11/01/20 1908 11/02/20 0452  BP: (!) 160/89 (!) 153/87   Pulse: 75 99  Resp: 20 18  Temp: 98.3 F (36.8 C) 98.1 F (36.7 C)  SpO2: 100% 98%   Low dose Norvasc and hasn't required Midodrine yet.   2/7- BP 140s/80s- no orthostasis- will monitor to make sure BP doesn't go too high on Norvasc 2.5 mg daily.   2/8- BP 140s/80s again- no orthostasis- con't regimen  2/9- BP 140s/150s- but when on Norvasc 5 mg, BP goes too low- pt refuses other BP meds.  2/10- BP high again this AM 153/99- higher than normal for her- if doesn't come down tomorrow, will have to increase Norvasc.    2/11- BP 168/90 - will increase Norvasc to 5 mg and monitor closely.   2/13 continued elevation. Increase norvasc to 10mg  today 9. Hypokalemia:   Kdur 40 meq bid  K+ 4.5 on 2/1, cont to monitor  2/7- K+ 3.7- con't regimen 10. Mild anemia:   Hb 9.5 on 1/31  on 2/5, labs ordered for Monday  2/7- Hb 10.0- doing better 11. COPD: Respiratory status stable on Dulera bid. Albuterol prn SOB.   2/10- resp treatments keeping things stable 12. Neurogenic bladder:    Improving  2/7- voiding on own OFF Flomax   2/9- voiding on own- basically zero bladder scans post void.   ?Bladder spasms vs , ZPP dysesthesia 13. Neurogenic bowel:   Started bowel program this evening- discussed at length with pt about bowel program.  ?Improving  2/7- having BMs on toilet- hasn't required bowel program since last week-   2/9-13 continues to have frequent small to med BMs, continent 14. At level SCI pain-  Keppra 250 mg BID for nerve pain  Changed Gabapentin to QHS per pt request  2/10- will hold Keppra x 3 days to see if the cause of being jittery- if so, will need to try another medicine-   2/12- pt off keppra now, anxiety better. No new reports of pain   -monitor.  15. Low grade fever this AM: Resolved  U/A on 1/25- (+) but had 30k Ecoli- sensitive to everything- Sx's improving- wait on starting ABX since not 100k EColi.  16. Severe depression 17. Peripheral edema  Dopplers (-) likely due  ot Norvasc AND LE weakness combined to cause swelling- needs TEDs.   Added ACE wraps over TEDs  2/7- doing better- con't regimen  LOS: 26 days A FACE TO FACE EVALUATION WAS PERFORMED  Ranelle Oyster 11/02/2020, 8:52 AM

## 2020-11-02 NOTE — Progress Notes (Signed)
Occupational Therapy Session Note  Patient Details  Name: Mary Davila MRN: [id number] Date of Birth: 1956/08/19  Today's Date: 11/02/2020 OT Individual Time: 1300-1400 OT Individual Time Calculation (min): 60 min    Short Term Goals: Week 3:  OT Short Term Goal 1 - Progress (Week 3): Met OT Short Term Goal 2 (Week 3): Pt will don pants at bed level with min A using AE PRN OT Short Term Goal 2 - Progress (Week 3): Met OT Short Term Goal 3 (Week 3): Pt will perform toileting tasks seated on BSC with mod A for clothing management OT Short Term Goal 3 - Progress (Week 3): Met OT Short Term Goal 4 (Week 3): Pt will complete LB dressing tasks at bed level with min A using AE PRN Week 4:  OT Short Term Goal 1 (Week 4): STG=LTG secondary to ELOS  Skilled Therapeutic Interventions/Progress Updates:    Patient seated in w/c, alert and ready for therapy session.  She notes occ pain in back but under control at this time.  She requests to put in a load of laundry as her daughter is unable to visit this weekend unexpectedly.  She is able to gather laundry w/c level, able to put laundry into washer and start machine.  She propels w/c on unit mod I.  Utilized stedy for transfer to/from w/c and shower bench.  She completed shower seated on bench with CS.  Dressing completed w/c level with reacher and sock aide - she requires assistance for donning teds and mod A for pants over hips with w/c push up otherwise set up with AD's.  Reviewed reach for controls on back of washer and stove surfaces, use of mirror for cooking and checking top loading washer - good understanding demonstrated.  Completed sit pivot transfer to recliner at close of session with min A.  Call bell and tray table in reach.    Therapy Documentation Precautions:  Precautions Precautions: Back,Fall Precaution Booklet Issued: No Precaution Comments: Reviewed back precautions Required Braces or Orthoses:  (no brace needed) Other Brace: no  brace needed per chart/orders Restrictions Weight Bearing Restrictions: No   Therapy/Group: Individual Therapy  Carlos Levering 11/02/2020, 7:30 AM

## 2020-11-03 LAB — BASIC METABOLIC PANEL
Anion gap: 11 (ref 5–15)
BUN: 11 mg/dL (ref 8–23)
CO2: 25 mmol/L (ref 22–32)
Calcium: 9.9 mg/dL (ref 8.9–10.3)
Chloride: 106 mmol/L (ref 98–111)
Creatinine, Ser: 0.81 mg/dL (ref 0.44–1.00)
GFR, Estimated: >60 mL/min (ref >60)
Glucose, Bld: 113 mg/dL — ABNORMAL HIGH (ref 70–99)
Potassium: 3.7 mmol/L (ref 3.5–5.1)
Sodium: 142 mmol/L (ref 135–145)

## 2020-11-03 LAB — CBC
HCT: 32.9 % — ABNORMAL LOW (ref 36.0–46.0)
Hemoglobin: 10.9 g/dL — ABNORMAL LOW (ref 12.0–15.0)
MCH: 29.9 pg (ref 26.0–34.0)
MCHC: 33.1 g/dL (ref 30.0–36.0)
MCV: 90.4 fL (ref 80.0–100.0)
Platelets: 339 10*3/uL (ref 150–400)
RBC: 3.64 MIL/uL — ABNORMAL LOW (ref 3.87–5.11)
RDW: 14.2 % (ref 11.5–15.5)
WBC: 4.1 10*3/uL (ref 4.0–10.5)
nRBC: 0 % (ref 0.0–0.2)

## 2020-11-03 NOTE — Progress Notes (Signed)
Physical Therapy Session Note  Patient Details  Name: Marshelle Bilger MRN: [id number] Date of Birth: 1956-04-19  Today's Date: 11/03/2020 PT Individual Time: 1000-1100 PT Individual Time Calculation (min): 60 min   Short Term Goals: Week 4:  PT Short Term Goal 1 (Week 4): =LTG due to ELOS  Skilled Therapeutic Interventions/Progress Updates:    Pt seated on commode on arrival, set up assist for pericare and clothing management. Pt returned to wc using stedy. Wc propulsion with BUE x 200 ft to gym with supervision. Pt stood in standing frame x8 min. Sit to stand from elevated standing frame sling 3 x 5. Pt able to reach with one hand at a time but does not feel safe without UE support. Pt practiced propelling wc up ramp with BUE x 4 for endurance with supervision. Pt propelled back to room with BUE x 200 ft and transferred to recliner with CGA squat pivot transfer. Pt donned ace wraps with tot A and was left seated in recliner with legs elevated and all needs in reach.   Therapy Documentation Precautions:  Precautions Precautions: Back,Fall Precaution Booklet Issued: No Precaution Comments: Reviewed back precautions Required Braces or Orthoses:  (no brace needed) Other Brace: no brace needed per chart/orders Restrictions Weight Bearing Restrictions: No    Therapy/Group: Individual Therapy  Dyane Dustman, SPT 11/03/2020, 12:59 PM

## 2020-11-03 NOTE — Progress Notes (Signed)
Pt declining prevalon boots tonight

## 2020-11-03 NOTE — Progress Notes (Signed)
Occupational Therapy Session Note  Patient Details  Name: Mary Davila MRN: [id number] Date of Birth: 01/02/1956  Today's Date: 11/03/2020 OT Individual Time: 9794-9971 OT Individual Time Calculation (min): 55 min    Short Term Goals: Week 3:  OT Short Term Goal 1 - Progress (Week 3): Met OT Short Term Goal 2 (Week 3): Pt will don pants at bed level with min A using AE PRN OT Short Term Goal 2 - Progress (Week 3): Met OT Short Term Goal 3 (Week 3): Pt will perform toileting tasks seated on BSC with mod A for clothing management OT Short Term Goal 3 - Progress (Week 3): Met OT Short Term Goal 4 (Week 3): Pt will complete LB dressing tasks at bed level with min A using AE PRN  Skilled Therapeutic Interventions/Progress Updates:    Initial focus on toileting and LB dressing. Transfer to toilet with use of Stedy secondary to location of toilet. Sit<>stand in [place] with supervision. Pt able to perform hygiene sitting on BSC over toilet. Pt able to thread pants using reacher. Pt boosed in w/c adequately enough to facilitate therapist pulling pants over hips. Pt propelled w/c to gym and practiced sit<>stand from EOB with standard chair placed in front of pt. Pt required min A for sit<>stand with BUE on arm rests of chair. Pt able to move BUE onto therapist shoulders and stand with upright posture. Pt performed sit<>stand X 5. Pt returned to room and remained in w/c with all needs within reach. Seat alarm activated and all needs within reach.  Therapy Documentation Precautions:  Precautions Precautions: Back,Fall Precaution Booklet Issued: No Precaution Comments: Reviewed back precautions Required Braces or Orthoses:  (no brace needed) Other Brace: no brace needed per chart/orders Restrictions Weight Bearing Restrictions: No Pain:  Pt denies pain     Therapy/Group: Individual Therapy  Leroy Libman 11/03/2020, 9:00 AM

## 2020-11-03 NOTE — Progress Notes (Signed)
[facility] PHYSICAL MEDICINE & REHABILITATION PROGRESS NOTE   Subjective/Complaints:  Pt reports still having low back issue- got kpad- helped a little- not a catch anymore- pulled something-  Tylenol and skelaxin take edge off.    ROS:  Pt denies SOB, abd pain, CP, N/V/C/D, and vision changes   Objective:   No results found. Recent Labs    11/03/20 0607  WBC 4.1  HGB 10.9*  HCT 32.9*  PLT 339   Recent Labs    11/03/20 0607  NA 142  K 3.7  CL 106  CO2 25  GLUCOSE 113*  BUN 11  CREATININE 0.81  CALCIUM 9.9    Intake/Output Summary (Last 24 hours) at 11/03/2020 1925 Last data filed at 11/03/2020 1846 Gross per 24 hour  Intake 720 ml  Output --  Net 720 ml        Physical Exam: Vital Signs Blood pressure (!) 145/81, pulse 73, temperature 98.1 F (36.7 C), temperature source Oral, resp. rate 17, height 5' 5.5" (1.664 m), weight 67.9 kg, SpO2 100 %. Constitutional: No distress . Vital signs reviewed. Sitting up in w/c waiting for OT, NAD HEENT: EOMI, oral membranes moist Neck: supple Cardiovascular: . RRR  Respiratory/Chest: CTA B/L- no W/R/R- good air movement   GI/Abdomen: Soft, NT, ND, (+)BS  Ext: no clubbing, cyanosis, or edema Psych: pleasant and cooperative- appropriate Skin: Warm and dry.   Back incision CDI.  Musc: pain with palpation in right low back, tender along posterior obliques, lower lat Neuro: Motor: B/l UE: 5/5 proximal to distal RLE: HF 2-/5, KE 3+/5, R DF 2/5 now- better- wiggling toes LLE- HF 2-/5, KE 3+/5, 0/5 DF--no change   Assessment/Plan: 1. Functional deficits which require 3+ hours per day of interdisciplinary therapy in a comprehensive inpatient rehab setting.  Physiatrist is providing close team supervision and 24 hour management of active medical problems listed below.  Physiatrist and rehab team continue to assess barriers to discharge/monitor patient progress toward functional and medical goals  Care  Tool:  Bathing    Body parts bathed by patient: Right arm,Left arm,Chest,Abdomen,Front perineal area,Right upper leg,Left upper leg,Right lower leg,Left lower leg,Face   Body parts bathed by helper: Buttocks Body parts n/a: Buttocks   Bathing assist Assist Level: Minimal Assistance - Patient > 75%     Upper Body Dressing/Undressing Upper body dressing Upper body dressing/undressing activity did not occur (including orthotics): Safety/medical concerns What is the patient wearing?: Pull over shirt    Upper body assist Assist Level: Independent    Lower Body Dressing/Undressing Lower body dressing      What is the patient wearing?: Pants,Incontinence brief     Lower body assist Assist for lower body dressing: Total Assistance - Patient < 25%     Toileting Toileting Toileting Activity did not occur Press photographer and hygiene only): N/A (no void or bm)  Toileting assist Assist for toileting: Dependent - Patient 0% (using Stedy)     Transfers Chair/bed transfer  Transfers assist  Chair/bed transfer activity did not occur: Safety/medical concerns  Chair/bed transfer assist level: Contact Guard/Touching assist Chair/bed transfer assistive device:  (Squat pivot)   Locomotion Ambulation   Ambulation assist   Ambulation activity did not occur: Safety/medical concerns          Walk 10 feet activity   Assist  Walk 10 feet activity did not occur: Safety/medical concerns        Walk 50 feet activity   Assist Walk 50 feet with  2 turns activity did not occur: Safety/medical concerns         Walk 150 feet activity   Assist Walk 150 feet activity did not occur: Safety/medical concerns         Walk 10 feet on uneven surface  activity   Assist Walk 10 feet on uneven surfaces activity did not occur: Safety/medical concerns         Wheelchair     Assist Will patient use wheelchair at discharge?: Yes Type of Wheelchair: Manual     Wheelchair assist level: Supervision/Verbal cueing Max wheelchair distance: 300'    Wheelchair 50 feet with 2 turns activity    Assist        Assist Level: Supervision/Verbal cueing   Wheelchair 150 feet activity     Assist      Assist Level: Supervision/Verbal cueing   Blood pressure (!) 145/81, pulse 73, temperature 98.1 F (36.7 C), temperature source Oral, resp. rate 17, height 5' 5.5" (1.664 m), weight 67.9 kg, SpO2 100 %.  Medical Problem List and Plan: 1.  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   2/9- d/w nursing- will use pad, so pt doesn't get claustrophobia from fall seat belt-   Continue CIR 2.  Antithrombotics: -DVT/anticoagulation:  Pharmaceutical: Lovenox  Dopplers B/L neg for DVT  Will need to send home on Lovenox for a total of 3 months to prevent DVT/PE              -antiplatelet therapy: N/a 3. Pain Management:   hydrocodone prn  Keppra to 500 mg BID for nerve pain  Started Vit B complex to help with mood  Changed Flexeril to Skelaxin due to severe sedation with Flexeril/Robaxin.   2/10- having jitteriness from she thinks, the Keppra- will hold for a few days, per her request- also, pt refused keppra this AM-   2/11- Keppra stopped  2/13 add kpad for right LBP, likely muscle strain vs referred from spine  2/14- pt doesn't want any additional meds- con't tylenol and skelaxin prn  4. Mood: LCSW to follow for evaluation and support.   2/12- continue paxil, mood better off keppra             -antipsychotic agents:  N/A 5. Neuropsych: This patient is capable of making decisions on her own behalf. 6. Skin/Wound Care: Monitor wound for healing.     back wound healing nicely 7. Fluids/Electrolytes/Nutrition: Monitor I/Os.  8.  HTN:  Monitor BP    Vitals:   11/03/20 1315 11/03/20 1922  BP: 127/70 (!) 145/81  Pulse: 96 73  Resp: 16 17  Temp: 98.2 F (36.8 C) 98.1 F (36.7 C)  SpO2: 100% 100%   Low dose Norvasc and hasn't required Midodrine yet.   2/7- BP 140s/80s- no orthostasis- will monitor to make sure BP doesn't go too high on Norvasc 2.5 mg daily.   2/8- BP 140s/80s again- no orthostasis- con't regimen  2/9- BP 140s/150s- but when on Norvasc 5 mg, BP goes too low- pt refuses other BP meds.  2/10- BP high again this AM 153/99- higher than normal for her- if doesn't come down tomorrow, will have to increase Norvasc.    2/11- BP 168/90 - will increase Norvasc to 5 mg and monitor closely.   2/13 continued elevation. Increase  norvasc to 10mg  today  2.14- BP 140s/80s today- will give it a chance to work 9. Hypokalemia:   Kdur 40 meq bid  K+ 4.5 on 2/1, cont to monitor  2/7- K+ 3.7- con't regimen 10. Mild anemia:   Hb 9.5 on 1/31 on 2/5, labs ordered for Monday  2/7- Hb 10.0- doing better 11. COPD: Respiratory status stable on Dulera bid. Albuterol prn SOB.   2/10- resp treatments keeping things stable 12. Neurogenic bladder:    Improving  2/7- voiding on own OFF Flomax   2/9- voiding on own- basically zero bladder scans post void.   ?Bladder spasms vs , ZPP dysesthesia 13. Neurogenic bowel:   Started bowel program this evening- discussed at length with pt about bowel program.  ?Improving  2/7- having BMs on toilet- hasn't required bowel program since last week-   2/9-13 continues to have frequent small to med BMs, continent 14. At level SCI pain-  Keppra 250 mg BID for nerve pain  Changed Gabapentin to QHS per pt request  2/10- will hold Keppra x 3 days to see if the cause of being jittery- if so, will need to try another medicine-   2/12- pt off keppra now, anxiety better. No new reports of pain   -monitor.  15. Low grade fever this AM: Resolved  U/A on 1/25- (+) but had 30k Ecoli- sensitive to  everything- Sx's improving- wait on starting ABX since not 100k EColi.  16. Severe depression 17. Peripheral edema  Dopplers (-) likely due ot Norvasc AND LE weakness combined to cause swelling- needs TEDs.   Added ACE wraps over TEDs  2/7- doing better- con't regimen  2/14- trace LLE edema- otherwise resolved  LOS: 27 days A FACE TO FACE EVALUATION WAS PERFORMED  Ragnar Waas 11/03/2020, 7:25 PM

## 2020-11-03 NOTE — Progress Notes (Signed)
Occupational Therapy Session Note  Patient Details  Name: Mary Davila MRN: [id number] Date of Birth: 1955/11/23  Today's Date: 11/03/2020 OT Individual Time: 1062-6948 OT Individual Time Calculation (min): 55 min    Short Term Goals: Week 4:  OT Short Term Goal 1 (Week 4): STG=LTG secondary to ELOS  Skilled Therapeutic Interventions/Progress Updates:    Pt resting in recliner upon arrival. Transfer to w/c with Stedy. Sit<>stand in [place] with supervision. Pt propelled w/c to day room and transferred to [facility] with supervision. OT intervention with focus on sitting balance and BUE strengthening/endurance. Pt tapped beach ball back to therapist 4x15 alternating ends of 2# bar. Pt tossed and bounced ball to therapist 4x15. Mat raised so pt's feet not resting on floor and kicker ball back to therapist with alternating feet. Pt transferred back to w/c and propelled to room. Transfer to toilet with Stedy. RN notified.   Therapy Documentation Precautions:  Precautions Precautions: Back,Fall Precaution Booklet Issued: No Precaution Comments: Reviewed back precautions Required Braces or Orthoses:  (no brace needed) Other Brace: no brace needed per chart/orders Restrictions Weight Bearing Restrictions: No Pain:  Pt denies pain this afternoon   Therapy/Group: Individual Therapy  Rich Brave 11/03/2020, 2:35 PM

## 2020-11-03 NOTE — Progress Notes (Signed)
Patient ID: Mary Davila, female   DOB: Feb 14, 1956, 65 y.o.   MRN: [id number]   SW faxed FMLA forms for pt dtr Laurelyn Sickle to [employer] (p:[phone]/f:[phone]).   Cecile Sheerer, MSW, LCSWA Office: [phone] Cell: [phone] Fax: [phone]

## 2020-11-04 MED ORDER — ENOXAPARIN (LOVENOX) PATIENT EDUCATION KIT
PACK | Freq: Once | Status: AC
  Filled 2020-11-04: qty 1

## 2020-11-04 NOTE — Progress Notes (Signed)
Discussed Lovenox injection with pt; informed that will have to go home on medication. Pt very upset and reports that was told she would not go home on medication. Informed pt that discussed with MD and would need to continue medication when home. Will do teaching when pt returns from therapy.

## 2020-11-04 NOTE — Patient Care Conference (Signed)
Inpatient RehabilitationTeam Conference and Plan of Care Update Date: 11/04/2020   Time: 11:14 AM    Patient Name: Mary Davila      Medical Record Number: [id number]  Date of Birth: 30-Jan-1956 Sex: Female         Room/Bed: 4M11C/4M11C-01 Payor Info: Payor: [employer] MEDICARE / Plan: [employer] MEDICARE HMO / Product Type: *No Product type* /    Admit Date/Time:  10/07/2020  3:51 PM  Primary Diagnosis:  Acute incomplete paraplegia [facility])  Hospital Problems: Principal Problem:   Acute incomplete paraplegia (HCC) Active Problems:   Lumbar disc herniation with myelopathy   Hypokalemia   Neurogenic bladder   Neurogenic bowel   Post-operative pain   Peripheral edema    Expected Discharge Date: Expected Discharge Date: 12/05/20  Team Members Present: Physician leading conference: Dr. Genice Rouge Care Coodinator Present: Cecile Sheerer, LCSWA;Kashia Brossard Marlyne Beards, RN, BSN, CRRN Nurse Present: Willey Blade, RN PT Present: Peter Congo, PT OT Present: Ardis Rowan, COTA;Jennifer Katrinka Blazing, OT PPS Coordinator present : Edson Snowball, Park Breed, SLP     Current Status/Progress Goal Weekly Team Focus  Bowel/Bladder   Pt continent of B/B. LBM 11/03/2020  Continued continence. Regular BM every 3 days or less  Assess B/B every shift and PRN   Swallow/Nutrition/ Hydration             ADL's   bathing at shower level or bed level-CGA; LB dressing-min A; toileting-min A; SB transfers-supervision; squat pivot transfers to BSC-CGA;  supervision overall w/c level  transfers, toileting, education, LB ADLs   Mobility   bed mobility Supervision with leg loops, Supervision to CGA squat pivot, CGA stand to stedy, supervision to mod I w/c mobility  Supervision overall at w/c level  transfers, standing as able, LE NMR, w/c mobility and management, family edu, SCI [employer] forces logistics/support/administrative officer Observations            Pain   Pain to lower back and legs rating a 5-7/10.  Managed well with tylenol and skelaxin  Pain rating of <3/10  Assess pain every every shift and PRN   Skin   Healing surgical incision to lower back  no new skin breakdown  Assess skin every shift and PRN     Discharge Planning:  Pt to have 24/7 care between her dtr and sister. Pt will d/c to her home- no ramp needed since she lives on ground  level apartment. Fam edu completed on 2/9 1pm-3pm; Fam edu again on 2/16 1pm-3pm with sister and dtr again. Pt set up for HHPT/OT/aide with [facility].   Team Discussion: Still having back pain, still need left prevolon boot, BP still running a little high, 140's/upper 80's. Continent bowel with PO bowel medications. Needs continued Lovenox teaching until discharge. A&Ox4, incision to back looks good, using stedy for transfers, continent B/B. Pain meds given before therapy, family education tomorrow afternoon. Patient on target to meet rehab goals: Supervision for bed mobility, supervision/contact guard W/C mobility and transfers. Some return strength in legs. Contact guard with AD in shower. Can do pants with lateral lean and AD.  *See Care Plan and progress notes for long and short-term goals.   Revisions to Treatment Plan:  None discussed.  Teaching Needs: Family education, medication management, skin/wound care, transfer training, W/C mobility, clothing management.  Current Barriers to Discharge: Inaccessible home environment, Decreased caregiver support, Home enviroment access/layout, Neurogenic bowel and bladder, Lack  of/limited family support, Medication compliance and Behavior  Possible Resolutions to Barriers: Continue current medications, bladder/bowel management, Lovenox teaching, provide emotional support to patient and family.     Medical Summary Current Status: Back pain- taking skelaxin and tylenol; continent B/B; sara steady for transfers- to do lovenox teaching today- needs prevalon on LLE at least  Barriers to Discharge:  Decreased family/caregiver support;Home enviroment access/layout;Neurogenic Bowel & Bladder;Other (comments)  Barriers to Discharge Comments: family ed tomorrow in afternoon; continued back pain- much better controlled- Possible Resolutions to [employer] Focus: PT- CGA transfers; w/c close to mod I; a little standing- not enough strength/endurance; slow return of strength; OT- CGA overall- d/c 2/18   Continued Need for Acute Rehabilitation Level of Care: The patient requires daily medical management by a physician with specialized training in physical medicine and rehabilitation for the following reasons: Direction of a multidisciplinary physical rehabilitation program to maximize functional independence : Yes Medical management of patient stability for increased activity during participation in an intensive rehabilitation regime.: Yes Analysis of laboratory values and/or radiology reports with any subsequent need for medication adjustment and/or medical intervention. : Yes   I attest that I was present, lead the team conference, and concur with the assessment and plan of the team.   Tennis Must 11/04/2020, 4:51 PM

## 2020-11-04 NOTE — Progress Notes (Signed)
Gave pt Lovenox kit and book to look over. Education provided to pt through [employer] booklet and discussion. Teach back method; pt able to report step by step method to administer injection. Pt was able to perform injection without difficulty. Informed pt that will have to administer injection for remainder of stay. Pt aware that will have to continue injection at home but unsure for how long.

## 2020-11-04 NOTE — Progress Notes (Signed)
Occupational Therapy Session Note  Patient Details  Name: Mary Davila MRN: [id number] Date of Birth: Aug 14, 1956  Today's Date: 11/04/2020 OT Individual Time: 1315-1430 OT Individual Time Calculation (min): 75 min    Short Term Goals: Week 4:  OT Short Term Goal 1 (Week 4): STG=LTG secondary to ELOS  Skilled Therapeutic Interventions/Progress Updates:    Pt resting in w/c upon arrival.  OT intervention with focus on squat pivot transfers EOM<>DABSC, clothing management, and squat pivot transfers w/c TTB. Pt completed DABSC tranfsers x 5 and pulled paper scrubs down and up each time. Pt required CGA for transfer. Pt also practiced TTB transfers X 2 with CGA. Pt supervision for w/c setup. W/c mobility with supervision. Pt required rest breaks between each practice set. Pt states she will use SB for TTB tranfsers at home with sister and/or daughter. Pt returned to room and transferred to toilet with Stedy. Pt remained on toilet. RN aware.  Therapy Documentation Precautions:  Precautions Precautions: Back,Fall Precaution Booklet Issued: No Precaution Comments: Reviewed back precautions Required Braces or Orthoses:  (no brace needed) Other Brace: no brace needed per chart/orders Restrictions Weight Bearing Restrictions: No    Pain:  Pt denies pain this afternoon   Therapy/Group: Individual Therapy  Rich Brave 11/04/2020, 2:41 PM

## 2020-11-04 NOTE — Progress Notes (Signed)
[facility] PHYSICAL MEDICINE & REHABILITATION PROGRESS NOTE   Subjective/Complaints:  Pt reports she takes skelaxin the 3x/day and tylenol for back pain- and using kpad- all takes the edge off, but still there/bothersome.   Declined prevalon boots last night- still needs them, esp on L, since has no DF on that foot.    ROS:   Pt denies SOB, abd pain, CP, N/V/C/D, and vision changes   Objective:   No results found. Recent Labs    11/03/20 0607  WBC 4.1  HGB 10.9*  HCT 32.9*  PLT 339   Recent Labs    11/03/20 0607  NA 142  K 3.7  CL 106  CO2 25  GLUCOSE 113*  BUN 11  CREATININE 0.81  CALCIUM 9.9    Intake/Output Summary (Last 24 hours) at 11/04/2020 0920 Last data filed at 11/03/2020 1846 Gross per 24 hour  Intake 480 ml  Output --  Net 480 ml        Physical Exam: Vital Signs Blood pressure (!) 149/87, pulse 94, temperature 98.2 F (36.8 C), temperature source Oral, resp. rate 18, height 5' 5.5" (1.664 m), weight 68 kg, SpO2 100 %. Constitutional: working with PT; appropriate, in w/c, going to bathroom, NAD HEENT: EOMI, oral membranes moist Neck: supple Cardiovascular: . RRR- no JVD Respiratory/Chest: CTA B/L- no W/R/R- good air movement   GI/Abdomen: Soft, NT, ND, (+)BS  Ext: no clubbing, cyanosis, or edema Psych: appropriate Skin: Warm and dry.   Back incision CDI.  Musc: pain with palpation in right low back, tender along posterior obliques, lower lat Neuro: Motor: B/l UE: 5/5 proximal to distal RLE: HF 2-/5, KE 3+/5, R DF 2/5 now- better- wiggling toes LLE- HF 2-/5, KE 3+/5, 0/5 DF--no change   Assessment/Plan: 1. Functional deficits which require 3+ hours per day of interdisciplinary therapy in a comprehensive inpatient rehab setting.  Physiatrist is providing close team supervision and 24 hour management of active medical problems listed below.  Physiatrist and rehab team continue to assess barriers to discharge/monitor patient progress  toward functional and medical goals  Care Tool:  Bathing    Body parts bathed by patient: Right arm,Left arm,Chest,Abdomen,Front perineal area,Right upper leg,Left upper leg,Right lower leg,Left lower leg,Face   Body parts bathed by helper: Buttocks Body parts n/a: Buttocks   Bathing assist Assist Level: Minimal Assistance - Patient > 75%     Upper Body Dressing/Undressing Upper body dressing Upper body dressing/undressing activity did not occur (including orthotics): Safety/medical concerns What is the patient wearing?: Pull over shirt    Upper body assist Assist Level: Independent    Lower Body Dressing/Undressing Lower body dressing      What is the patient wearing?: Pants,Incontinence brief     Lower body assist Assist for lower body dressing: Total Assistance - Patient < 25%     Toileting Toileting Toileting Activity did not occur Press photographer and hygiene only): N/A (no void or bm)  Toileting assist Assist for toileting: Dependent - Patient 0% (using Stedy)     Transfers Chair/bed transfer  Transfers assist  Chair/bed transfer activity did not occur: Safety/medical concerns  Chair/bed transfer assist level: Contact Guard/Touching assist Chair/bed transfer assistive device:  (Squat pivot)   Locomotion Ambulation   Ambulation assist   Ambulation activity did not occur: Safety/medical concerns          Walk 10 feet activity   Assist  Walk 10 feet activity did not occur: Safety/medical concerns  Walk 50 feet activity   Assist Walk 50 feet with 2 turns activity did not occur: Safety/medical concerns         Walk 150 feet activity   Assist Walk 150 feet activity did not occur: Safety/medical concerns         Walk 10 feet on uneven surface  activity   Assist Walk 10 feet on uneven surfaces activity did not occur: Safety/medical concerns         Wheelchair     Assist Will patient use wheelchair at  discharge?: Yes Type of Wheelchair: Manual    Wheelchair assist level: Supervision/Verbal cueing Max wheelchair distance: 300'    Wheelchair 50 feet with 2 turns activity    Assist        Assist Level: Supervision/Verbal cueing   Wheelchair 150 feet activity     Assist      Assist Level: Supervision/Verbal cueing   Blood pressure (!) 149/87, pulse 94, temperature 98.2 F (36.8 C), temperature source Oral, resp. rate 18, height 5' 5.5" (1.664 m), weight 68 kg, SpO2 100 %.  Medical Problem List and Plan: 1.  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   2/9- d/w nursing- will use pad, so pt doesn't get claustrophobia from fall seat belt-   Continue CIR 2.  Antithrombotics: -DVT/anticoagulation:  Pharmaceutical: Lovenox  Dopplers B/L neg for DVT  Will need to send home on Lovenox for a total of 3 months to prevent DVT/PE              -antiplatelet therapy: N/a 3. Pain Management:   hydrocodone prn  Keppra to 500 mg BID for nerve pain  Started Vit B complex to help with mood  Changed Flexeril to Skelaxin due to severe sedation with Flexeril/Robaxin.   2/10- having jitteriness from she thinks, the Keppra- will hold for a few days, per her request- also, pt refused keppra this AM-   2/11- Keppra stopped  2/13 add kpad for right LBP, likely muscle strain vs referred from spine  2/14- pt doesn't want any additional meds- con't tylenol and skelaxin prn  2/15- con't meds and kpad- con't regimen  4. Mood: LCSW to follow for evaluation and support.   2/12- continue paxil, mood better off keppra             -antipsychotic agents:  N/A 5. Neuropsych: This patient is capable of making decisions on her own behalf. 6. Skin/Wound Care: Monitor wound for healing.     back  wound healing nicely 7. Fluids/Electrolytes/Nutrition: Monitor I/Os.  8.  HTN: Monitor BP    Vitals:   11/04/20 0334 11/04/20 0740  BP: (!) 149/87   Pulse: 94   Resp: 18   Temp: 98.2 F (36.8 C)   SpO2: 100% 100%   Low dose Norvasc and hasn't required Midodrine yet.   2/7- BP 140s/80s- no orthostasis- will monitor to make sure BP doesn't go too high on Norvasc 2.5 mg daily.   2/8- BP 140s/80s again- no orthostasis- con't regimen  2/9- BP 140s/150s- but when on Norvasc 5 mg, BP goes too low- pt refuses other BP meds.  2/10- BP high again this AM 153/99- higher than normal for her- if doesn't come down tomorrow, will have to increase Norvasc.    2/11- BP 168/90 -  will increase Norvasc to 5 mg and monitor closely.   2/13 continued elevation. Increase norvasc to 10mg  today  2.14- BP 140s/80s today- will give it a chance to work  2/15- BP still 140s/80s- on 10 mg Norvasc- has improved, just not below 140s systolic yet-will con't to monitor  9. Hypokalemia:   Kdur 40 meq bid  K+ 4.5 on 2/1, cont to monitor  2/7- K+ 3.7- con't regimen 10. Mild anemia:   Hb 9.5 on 1/31 on 2/5, labs ordered for Monday  2/7- Hb 10.0- doing better 11. COPD: Respiratory status stable on Dulera bid. Albuterol prn SOB.   2/10- resp treatments keeping things stable 12. Neurogenic bladder:    Improving  2/7- voiding on own OFF Flomax   2/9- voiding on own- basically zero bladder scans post void.   ?Bladder spasms vs , ZPP dysesthesia 13. Neurogenic bowel:   Started bowel program this evening- discussed at length with pt about bowel program.  ?Improving  2/7- having BMs on toilet- hasn't required bowel program since last week-   2/9-13 continues to have frequent small to med BMs, continent  2/15- small frequent BMs- con't regimen with PO meds 14. At level SCI pain-  Keppra 250 mg BID for nerve pain  Changed Gabapentin to QHS per pt request  2/10- will hold Keppra x 3 days to see if the cause of being  jittery- if so, will need to try another medicine-   2/12- pt off keppra now, anxiety better. No new reports of pain   -monitor.  15. Low grade fever this AM: Resolved  U/A on 1/25- (+) but had 30k Ecoli- sensitive to everything- Sx's improving- wait on starting ABX since not 100k EColi.  16. Severe depression 17. Peripheral edema  Dopplers (-) likely due ot Norvasc AND LE weakness combined to cause swelling- needs TEDs.   Added ACE wraps over TEDs  2/7- doing better- con't regimen  2/14- trace LLE edema- otherwise resolved  2/15- refused prevalon boots last night- will encourage pt to use at least on LLE 18. Dispo  2/15- d/c Friday- will need Lovenox teaching prior- have ordered- waiting for nursing to teach- Spoke with nurse- will occur today  LOS: 28 days A FACE TO FACE EVALUATION WAS PERFORMED  Blondine Hottel 11/04/2020, 9:20 AM

## 2020-11-04 NOTE — Progress Notes (Signed)
Physical Therapy Session Note  Patient Details  Name: Mary Davila MRN: [id number] Date of Birth: [birth year]  Today's Date: 11/04/2020 PT Individual Time: 0800-0828 PT Individual Time Calculation (min): 28 min   Short Term Goals: Week 4:  PT Short Term Goal 1 (Week 4): =LTG due to ELOS  Skilled Therapeutic Interventions/Progress Updates:    Patient received sitting up in wc, agreeable to PT. She reports 7/10 pain in low back and B LE. RN provided pain rx. PT providing rest breaks, repositioning and distractions to assist with pain management. Patient requesting to use bathroom. Stedy used to transfer to toilet. Patient able to stand with supervision using Stedy bar. MinA for clothing management in standing. Patient continent of bowel/bladder and supervision for perihygiene. Patient returning chair via [place]. PT donning TED hose totalA. Patient remaining up in wc, chair alarm on, call light within reach.   Therapy Documentation Precautions:  Precautions Precautions: Back,Fall Precaution Booklet Issued: No Precaution Comments: Reviewed back precautions Required Braces or Orthoses:  (no brace needed) Other Brace: no brace needed per chart/orders Restrictions Weight Bearing Restrictions: No    Therapy/Group: Individual Therapy  Elizebeth Koller, PT, DPT, CBIS  11/04/2020, 7:46 AM

## 2020-11-04 NOTE — Progress Notes (Signed)
Occupational Therapy Session Note  Patient Details  Name: Mary Davila MRN: [id number] Date of Birth: Apr 21, 1956  Today's Date: 11/04/2020 OT Individual Time: 1130-1200 OT Individual Time Calculation (min): 30 min    Short Term Goals: Week 4:  OT Short Term Goal 1 (Week 4): STG=LTG secondary to ELOS  Skilled Therapeutic Interventions/Progress Updates:    OT intervention with focus on crossing LLE over Rt knee and doffing/donning sock without use of AE. Pt required CGA to complete task. Pt practiced task X 3. Pt returned to room and remained in w/c with all needs within reach and seat alarm activated.   Therapy Documentation Precautions:  Precautions Precautions: Back,Fall Precaution Booklet Issued: No Precaution Comments: Reviewed back precautions Required Braces or Orthoses:  (no brace needed) Other Brace: no brace needed per chart/orders Restrictions Weight Bearing Restrictions: No  Pain: Pain Assessment Pain Scale: 0-10 Pain Score: 7  Pain Type: Acute pain Pain Location: Leg Pain Orientation: Right;Left Pain Descriptors / Indicators: Aching;Discomfort Pain Frequency: Constant Pain Onset: On-going Pain Intervention(s):premedicated Therapy/Group: Individual Therapy  Rich Brave 11/04/2020, 12:06 PM

## 2020-11-04 NOTE — Progress Notes (Signed)
Patient ID: Mary Davila, female   DOB: 1956/04/08, 65 y.o.   MRN: [id number]  SW met with pt in room to provide updates from team conference on gains made, and d/c date remains. SW informed faxed FMLA forms yesterday for [name]. SW also discussed if she was aware her sister has heard from [employer] about hospital bed delivery. Reports her sister reports they will pick up DME from retail store tomorrow, and has discussed delivery. Confirms family edu tomorrow 1pm-3pm.   Loralee Pacas, MSW, [place] Office: [phone] Cell: [phone] Fax: [phone]

## 2020-11-04 NOTE — Progress Notes (Signed)
Physical Therapy Session Note  Patient Details  Name: Mary Davila MRN: [id number] Date of Birth: Jan 02, 1956  Today's Date: 11/04/2020 PT Individual Time: 0900-1000 PT Individual Time Calculation (min): 60 min   Short Term Goals: Week 4:  PT Short Term Goal 1 (Week 4): =LTG due to ELOS  Skilled Therapeutic Interventions/Progress Updates:   Pt seated in wc on arrival and agreeable to therapy. Propelled wc with BUE x200 ft to gym, mod I including management of wc parts. Performed squat pivot transfer wc<>mat table with close supervision. Pt was able to transfer to supine with supervision, rolled to prone with minimal VCs. Pt required CGA-min A to get to quadruped. Pt performed UE bird dogs, 4 bouts of 30 sec with CGA and prone rest breaks. Pt was able to crawl backwards and laterally on table with focus on hip extension and abduction, with CGA and VCs. Quadruped>tall kneeling with blue bench x5, with CGA, VCs, and tapping facilitation to glutes to promote hip extension. Pt reported extreme fatigue after performing quadruped activities and some pain in her R flank/back. Palpation revealed tenderness over quadratus lumborum>paraspinals. Pt has been utilizing heat and pain medication for pain management. Pt returned to room and transferred to recliner using supervision squat pivot. Pt left with legs elevated and all needs in reach.     Therapy Documentation Precautions:  Precautions Precautions: Back,Fall Precaution Booklet Issued: No Precaution Comments: Reviewed back precautions Required Braces or Orthoses:  (no brace needed) Other Brace: no brace needed per chart/orders Restrictions Weight Bearing Restrictions: No    Therapy/Group: Individual Therapy  Dyane Dustman, SPT 11/04/2020, 1:22 PM

## 2020-11-05 NOTE — Plan of Care (Signed)
  Problem: RH Dressing Goal: LTG Patient will perform lower body dressing w/assist (OT) Description: LTG: Patient will perform lower body dressing with assist, with/without cues in positioning using equipment (OT) Flowsheets (Taken 11/05/2020 0723) LTG: Pt will perform lower body dressing with assistance level of: (Downgraded JLS) Minimal Assistance - Patient > 75% Note: Downgraded JLS   Problem: RH Toileting Goal: LTG Patient will perform toileting task (3/3 steps) with assistance level (OT) Description: LTG: Patient will perform toileting task (3/3 steps) with assistance level (OT)  Flowsheets (Taken 11/05/2020 0723) LTG: Pt will perform toileting task (3/3 steps) with assistance level: (Downgraded JLS) Contact Guard/Touching assist Note: Downgraded JLS

## 2020-11-05 NOTE — Progress Notes (Signed)
Inpatient Rehabilitation Care Coordinator Discharge Note  The overall goal for the admission was met for:   Discharge location: Yes. D/c to home with 24/7 care from sister and dtr.   Length of Stay: Yes. 31 days.  Discharge activity level: Yes. Supervision at w/c level.  Home/community participation: Yes. Limited.   Services provided included: MD, RD, PT, OT, RN, CM, TR, Pharmacy, Neuropsych and SW  Financial Services: Private Insurance: [employer]  Choices offered to/list presented to:yes  Follow-up services arranged: Home Health: [facility] for HHPT/OT/aide, DME: [employer] [facility] for hospital bed, slide board, DABSC, TTB and Patient/Family request agency HH: [place], DME: N/A  Specialty w/c through [employer]  Comments (or additional information):  Patient/Family verbalized understanding of follow-up arrangements: Yes  Individual responsible for coordination of the follow-up plan: Pt will have assistance with coordinating care needs. Contact pt  Confirmed correct DME delivered: Rana Snare 11/05/2020    Rana Snare

## 2020-11-05 NOTE — Progress Notes (Signed)
Occupational Therapy Session Note  Patient Details  Name: Mary Davila MRN: [id number] Date of Birth: [birth year]  Today's Date: 11/05/2020 OT Individual Time: 3716-9678 OT Individual Time Calculation (min): 70 min    Short Term Goals: Week 4:  OT Short Term Goal 1 (Week 4): STG=LTG secondary to ELOS  Skilled Therapeutic Interventions/Progress Updates:    Pt resting in w/c upon arrival.  OT intervention with focus on TTB transfers, w/c<>bed transfers, bathing at shower level, dressing at w/c and EOB level, energy conservation, and safety awareness to increase independence with BADLs and prepare for discharge home 2/18. Squat pivot transfer w/c<>TTB transfers with CGA. Bathing with supervision seated on TTB. UB dressing at w/c level and EOB for LB dressing using lateral leans to pull pants over hips. Pt requires assistance donning Ted hose and shoes. Pt pleased with progress. Pt remained in w/c with all needs within reach.   Therapy Documentation Precautions:  Precautions Precautions: Back,Fall Precaution Booklet Issued: No Precaution Comments: Reviewed back precautions Required Braces or Orthoses:  (no brace needed) Other Brace: no brace needed per chart/orders Restrictions Weight Bearing Restrictions: No Pain: Pain Assessment Pain Scale: 0-10 Pain Score: 0-No pain (premed prior to therapy)   Therapy/Group: Individual Therapy  Rich Brave 11/05/2020, 10:57 AM

## 2020-11-05 NOTE — Progress Notes (Signed)
  Occupational Therapy Discharge Summary  Patient Details  Name: Mary Davila MRN: [id number] Date of Birth: 1956/03/12  Patient has met 8 of 8 long term goals due to improved activity tolerance, improved balance, postural control, ability to compensate for deficits and improved coordination.  Patient to discharge at [facility] Assist level.  Pt made excellent progress with BADLs, functional transfers, and IADLs at w/c level. Pt requires min A for LB dressing (donning ted hose and shoes) but completes all other BADLs/IADLs at supervision level. Pt's sister and daughter have participated in education sessions X 2 and provided the appropriate level of supervision/assistance. Recommend 24/7 supervision. Patient's care partner is independent to provide the necessary physical assistance at discharge.    Recommendation:  Patient will benefit from ongoing skilled OT services in home health setting to continue to advance functional skills in the area of BADL and iADL.  Equipment: TTB, DABSC  Reasons for discharge: treatment goals met  Patient/family agrees with progress made and goals achieved: Yes  OT Discharge Vision Baseline Vision/History: Wears glasses Wears Glasses: At all times Patient Visual Report: No change from baseline Vision Assessment?: No apparent visual deficits Perception  Perception: Within Functional Limits Praxis Praxis: Intact Cognition Overall Cognitive Status: Within Functional Limits for tasks assessed Arousal/Alertness: Awake/alert Orientation Level: Oriented X4 Attention: Selective Selective Attention: Appears intact Memory: Appears intact Awareness: Appears intact Problem Solving: Appears intact Safety/Judgment: Appears intact Sensation Sensation Light Touch: Appears Intact Hot/Cold: Appears Intact Proprioception: Appears Intact Stereognosis: Not tested Coordination Gross Motor Movements are Fluid and Coordinated: Yes Fine Motor Movements are Fluid and  Coordinated: Yes Motor  Motor Motor: Paraplegia;Abnormal tone;Abnormal postural alignment and control Trunk/Postural Assessment  Cervical Assessment Cervical Assessment: Within Functional Limits Thoracic Assessment Thoracic Assessment:  (back precautions) Lumbar Assessment Lumbar Assessment:  (back precautions) Postural Control Postural Control: Within Functional Limits  Balance Static Sitting Balance Static Sitting - Balance Support: Bilateral upper extremity supported;Feet supported Static Sitting - Level of Assistance: 6: Modified independent (Device/Increase time) Dynamic Sitting Balance Dynamic Sitting - Balance Support: During functional activity Dynamic Sitting - Level of Assistance: 5: Stand by assistance Extremity/Trunk Assessment RUE Assessment RUE Assessment: Within Functional Limits LUE Assessment LUE Assessment: Within Functional Limits   Rich Brave 11/05/2020, 2:25 PM

## 2020-11-05 NOTE — Progress Notes (Signed)
Physical Therapy Session Note  Patient Details  Name: Mary Davila MRN: [id number] Date of Birth: [birth year]  Today's Date: 11/05/2020 PT Individual Time: 7218-2883 PT Individual Time Calculation (min): 55 min   Short Term Goals: Week 3:  PT Short Term Goal 1 (Week 3): Pt will perform bed mobility with min A consistently PT Short Term Goal 1 - Progress (Week 3): Met PT Short Term Goal 2 (Week 3): Pt will be independent with adaptive equipment for bed mobility and transfers. PT Short Term Goal 2 - Progress (Week 3): Progressing toward goal PT Short Term Goal 3 (Week 3): Pt will maintain dynamic sitting balance with close supervision x5 minutes. PT Short Term Goal 3 - Progress (Week 3): Met  Skilled Therapeutic Interventions/Progress Updates:    Pt seated in wc with daughter and sister present for family education. Pt donned leg loops with assist for ankle loops. Family was educated on uses and donning of leg loops. Pt propelled wc with BUE 500+ feet to and from main hospital entrance for car transfer, mod I. Pt able to set up for car transfer mod I and directed family in getting car seat in best position for transfer. Slide board transfer wc<>car x3 with CGA to stabilize board. Each family member was able to successfully assist pt with transfer and expressed confidence in ability. Pt was able to manage LE mod I with use of leg loops to position in and out of car. Pt returned to room and transferred to commode with stedy with CGA. Pt doffed pants with min A and donned brief and pants with reacher while seated on commode with supervision. Pt was left on commode with family present.   Therapy Documentation Precautions:  Precautions Precautions: Back,Fall Precaution Booklet Issued: No Precaution Comments: Reviewed back precautions Required Braces or Orthoses:  (no brace needed) Other Brace: no brace needed per chart/orders Restrictions Weight Bearing Restrictions: No   Therapy/Group:  Individual Therapy  Sharen Counter, SPT 11/05/2020, 4:26 PM

## 2020-11-05 NOTE — Progress Notes (Signed)
Occupational Therapy Session Note  Patient Details  Name: Mary Davila MRN: [id number] Date of Birth: 08-20-1956  Today's Date: 11/05/2020 OT Individual Time: 1300-1355 OT Individual Time Calculation (min): 55 min    Short Term Goals: Week 4:  OT Short Term Goal 1 (Week 4): STG=LTG secondary to ELOS  Skilled Therapeutic Interventions/Progress Updates:    Pt resting in w/c upon arrival. Pt propelled w/c to ADL apartment and practiced w/c<>sofa transfers with CGA. Squat pivot transfer to sofa and SB transfer back to w/c. Pt practiced X 2. Pt's sister and daughter joined session and practiced bed<>w/c transfers, TTB transfers, and DABSC transfers. Pt completed all transfers with close supervision. Daughter and sister provided appropriate level of supervision. Pt independent with directing care. Pt and family verbalized understanding of recommendation for 24/7 supervision. Pt returned to room and remained in w/c with all needs within reach. Family present.   Therapy Documentation Precautions:  Precautions Precautions: Back,Fall Precaution Booklet Issued: No Precaution Comments: Reviewed back precautions Required Braces or Orthoses:  (no brace needed) Other Brace: no brace needed per chart/orders Restrictions Weight Bearing Restrictions: No Pain:  Pt denies pain  Therapy/Group: Individual Therapy  Rich Brave 11/05/2020, 2:17 PM

## 2020-11-05 NOTE — Progress Notes (Signed)
[facility] PHYSICAL MEDICINE & REHABILITATION PROGRESS NOTE   Subjective/Complaints:  Notes she's upset needs Lovenox injection at home- asking how long she needs it- went over the CHEST guidelines and explained if a patient is not walking, will need a total of 3 months from surgery. She's still upset, but explained she can refuse them, but her risk is very high for DVT/PE if she doesn't take Lovenox injections.   Said Prevalon boots pull on back and makes her hurt more- says uses base of bed to prop up feet, but refuses to wear because they are heavy and "pulls on back"- explained if she would wear the left one, we would be in better shape, but again, she's an adult and can make her own decisions.   ROS:   Pt denies SOB, abd pain, CP, N/V/C/D, and vision changes   Objective:   No results found. Recent Labs    11/03/20 0607  WBC 4.1  HGB 10.9*  HCT 32.9*  PLT 339   Recent Labs    11/03/20 0607  NA 142  K 3.7  CL 106  CO2 25  GLUCOSE 113*  BUN 11  CREATININE 0.81  CALCIUM 9.9    Intake/Output Summary (Last 24 hours) at 11/05/2020 1837 Last data filed at 11/05/2020 1315 Gross per 24 hour  Intake 800 ml  Output -  Net 800 ml        Physical Exam: Vital Signs Blood pressure (!) 149/83, pulse 100, temperature 98.1 F (36.7 C), temperature source Oral, resp. rate 18, height 5' 5.5" (1.664 m), weight 67.2 kg, SpO2 94 %. Constitutional: sitting up in w/c, unhappy about lovenox, NAD HEENT: EOMI, oral membranes moist Neck: supple Cardiovascular: . RRR- no JVD Respiratory/Chest: CTA B/L- no W/R/R- good air movement  GI/Abdomen: Soft, NT, ND, (+)BS   Ext: no clubbing, cyanosis, or edema Psych: appropriate but unhappy and showing that emotion about the Lovenox Skin: Warm and dry.   Back incision CDI- healed.  Musc: pain with palpation in right low back, tender along posterior obliques, lower lat- no change Neuro: Motor: B/l UE: 5/5 proximal to distal RLE: HF 2-/5,  KE 3+/5, R DF 2/5 now- better- wiggling toes LLE- HF 2-/5, KE 3+/5, 0/5 DF--no change   Assessment/Plan: 1. Functional deficits which require 3+ hours per day of interdisciplinary therapy in a comprehensive inpatient rehab setting.  Physiatrist is providing close team supervision and 24 hour management of active medical problems listed below.  Physiatrist and rehab team continue to assess barriers to discharge/monitor patient progress toward functional and medical goals  Care Tool:  Bathing    Body parts bathed by patient: Right arm,Left arm,Chest,Abdomen,Front perineal area,Right upper leg,Left upper leg,Right lower leg,Left lower leg,Face,Buttocks   Body parts bathed by helper: Buttocks Body parts n/a: Buttocks   Bathing assist Assist Level: Contact Guard/Touching assist     Upper Body Dressing/Undressing Upper body dressing Upper body dressing/undressing activity did not occur (including orthotics): Safety/medical concerns What is the patient wearing?: Pull over shirt    Upper body assist Assist Level: Independent    Lower Body Dressing/Undressing Lower body dressing      What is the patient wearing?: Pants,Underwear/pull up     Lower body assist Assist for lower body dressing: Supervision/Verbal cueing     Toileting Toileting Toileting Activity did not occur (Clothing management and hygiene only): N/A (no void or bm)  Toileting assist Assist for toileting: Minimal Assistance - Patient > 75%     Transfers  Chair/bed transfer  Transfers assist  Chair/bed transfer activity did not occur: Safety/medical concerns  Chair/bed transfer assist level: Supervision/Verbal cueing Chair/bed transfer assistive device:  (Squat pivot)   Locomotion Ambulation   Ambulation assist   Ambulation activity did not occur: Safety/medical concerns          Walk 10 feet activity   Assist  Walk 10 feet activity did not occur: Safety/medical concerns        Walk 50 feet  activity   Assist Walk 50 feet with 2 turns activity did not occur: Safety/medical concerns         Walk 150 feet activity   Assist Walk 150 feet activity did not occur: Safety/medical concerns         Walk 10 feet on uneven surface  activity   Assist Walk 10 feet on uneven surfaces activity did not occur: Safety/medical concerns         Wheelchair     Assist Will patient use wheelchair at discharge?: Yes Type of Wheelchair: Manual    Wheelchair assist level: Independent Max wheelchair distance: 500+    Wheelchair 50 feet with 2 turns activity    Assist        Assist Level: Independent   Wheelchair 150 feet activity     Assist      Assist Level: Independent   Blood pressure (!) 149/83, pulse 100, temperature 98.1 F (36.7 C), temperature source Oral, resp. rate 18, height 5' 5.5" (1.664 m), weight 67.2 kg, SpO2 94 %.  Medical Problem List and Plan: 1.  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   2/9- d/w nursing- will use pad, so pt doesn't get claustrophobia from fall seat belt-   2/16- pt refusing prevalon boots- explained the reasoning behind using them and would prefer her to at least wear L side, but can only inform her of reasoning.   Continue CIR 2.  Antithrombotics: -DVT/anticoagulation:  Pharmaceutical: Lovenox  Dopplers B/L neg for DVT  Will need to send home on Lovenox for a total of 3 months to prevent DVT/PE  2/16- pt taught to do lovenox yesterday- is very upset will need to do Lovenox- will need a total of 3 months from 2nd surgery- went over 89% chance of DVT/PE in first 90 days without Lovenox and 1% if does take Lovenox             -antiplatelet therapy: N/a 3. Pain Management:   hydrocodone prn  Keppra to 500  mg BID for nerve pain  Started Vit B complex to help with mood  Changed Flexeril to Skelaxin due to severe sedation with Flexeril/Robaxin.   2/10- having jitteriness from she thinks, the Keppra- will hold for a few days, per her request- also, pt refused keppra this AM-   2/11- Keppra stopped  2/13 add kpad for right LBP, likely muscle strain vs referred from spine  2/14- pt doesn't want any additional meds- con't tylenol and skelaxin prn  2/16- con't current regimen pt refusing other meds  4. Mood: LCSW to follow for evaluation and support.   2/12- continue paxil, mood better off keppra             -antipsychotic agents:  N/A 5. Neuropsych: This patient is capable of making decisions on her own behalf.  6. Skin/Wound Care: Monitor wound for healing.     back wound healing nicely 7. Fluids/Electrolytes/Nutrition: Monitor I/Os.  8.  HTN: Monitor BP    Vitals:   11/05/20 0820 11/05/20 1304  BP:  (!) 149/83  Pulse:  100  Resp:  18  Temp:  98.1 F (36.7 C)  SpO2: 99% 94%   Low dose Norvasc and hasn't required Midodrine yet.   2/7- BP 140s/80s- no orthostasis- will monitor to make sure BP doesn't go too high on Norvasc 2.5 mg daily.   2/8- BP 140s/80s again- no orthostasis- con't regimen  2/9- BP 140s/150s- but when on Norvasc 5 mg, BP goes too low- pt refuses other BP meds.  2/10- BP high again this AM 153/99- higher than normal for her- if doesn't come down tomorrow, will have to increase Norvasc.    2/11- BP 168/90 - will increase Norvasc to 5 mg and monitor closely.   2/16- Norvasc still at 10 mg- pt doesn't want additional meds- will con't Norvasc 9. Hypokalemia:   Kdur 40 meq bid  K+ 4.5 on 2/1, cont to monitor  2/7- K+ 3.7- con't regimen 10. Mild anemia:   Hb 9.5 on 1/31 on 2/5, labs ordered for Monday  2/7- Hb 10.0- doing better 11. COPD: Respiratory status stable on Dulera bid. Albuterol prn SOB.   2/10- resp treatments keeping things stable 12. Neurogenic bladder:     Improving  2/7- voiding on own OFF Flomax  2/9- voiding on own- basically zero bladder scans post void.   ?Bladder spasms vs , ZPP dysesthesia 13. Neurogenic bowel:   Started bowel program this evening- discussed at length with pt about bowel program.  ?Improving  2/7- having BMs on toilet- hasn't required bowel program since last week-   2/9-13 continues to have frequent small to med BMs, continent  2/15- small frequent BMs- con't regimen with PO meds 14. At level SCI pain-  Keppra 250 mg BID for nerve pain  Changed Gabapentin to QHS per pt request  2/10- will hold Keppra x 3 days to see if the cause of being jittery- if so, will need to try another medicine-   2/12- pt off keppra now, anxiety better. No new reports of pain   -monitor.  15. Low grade fever this AM: Resolved  U/A on 1/25- (+) but had 30k Ecoli- sensitive to everything- Sx's improving- wait on starting ABX since not 100k EColi.  16. Severe depression 17. Peripheral edema  Dopplers (-) likely due ot Norvasc AND LE weakness combined to cause swelling- needs TEDs.   Added ACE wraps over TEDs  2/7- doing better- con't regimen  2/14- trace LLE edema- otherwise resolved  2/15- refused prevalon boots last night- will encourage pt to use at least on LLE 18. Dispo  2/15- d/c Friday- will need Lovenox teaching prior- have ordered- waiting for nursing to teach- Spoke with nurse- will occur today  2/16- pt upset, but did explain reasoning behind Lovenox and Prevalon boots-    Pt has been seen by [employer] for manual ultra light w/c- she meets criteria for this due to being an incomplete paraplegic- and not walking and not sure if she will walk more than short distances in future- she needs ultra light weight since it will allow her to propel w/c herself and be more independent- she has LE edema, and ideally would have elevating leg rests, if possible, however also at risk for developing spasticity, and will also need pressure  relieving  w/c cushion- Jay cushion line would be perfect;  so she can help prevent pressure ulcers which are so frequent in SCI patients- She also will need arm rests to do pressure relief to help prevent those ulcers.  Will try and see if they can get her loaner w/c until she get get new w/c- which she more than qualifies for.   LOS: 29 days A FACE TO FACE EVALUATION WAS PERFORMED  Wrenly Lauritsen 11/05/2020, 6:37 PM

## 2020-11-05 NOTE — Progress Notes (Signed)
Pt was agreeable to wearing boots for a few hours, but then took them off by herself. She states "they are hot and I can not turn with them"

## 2020-11-06 ENCOUNTER — Other Ambulatory Visit: Payer: Self-pay | Admitting: Physical Medicine and Rehabilitation

## 2020-11-06 DIAGNOSIS — M62838 Other muscle spasm: Secondary | ICD-10-CM

## 2020-11-06 DIAGNOSIS — S24109A Unspecified injury at unspecified level of thoracic spinal cord, initial encounter: Secondary | ICD-10-CM

## 2020-11-06 MED ORDER — B COMPLEX-C PO TABS: 1.0000 | ORAL_TABLET | Freq: Every day | ORAL | 0 refills | Status: AC

## 2020-11-06 MED ORDER — ACETAMINOPHEN 325 MG PO TABS: 325.0000 mg | ORAL_TABLET | ORAL | 0 refills | Status: AC | PRN

## 2020-11-06 MED ORDER — METAXALONE 800 MG PO TABS: 800.0000 mg | ORAL_TABLET | Freq: Three times a day (TID) | ORAL | 0 refills | Status: DC | PRN

## 2020-11-06 MED ORDER — GABAPENTIN 300 MG PO CAPS: 300.0000 mg | ORAL_CAPSULE | Freq: Every day | ORAL | Status: DC

## 2020-11-06 MED ORDER — ENOXAPARIN SODIUM 40 MG/0.4ML ~~LOC~~ SOLN: 40.0000 mg | SUBCUTANEOUS | 0 refills | Status: DC

## 2020-11-06 MED FILL — METAXALONE 800 MG TABS: 800 | 60 days supply | Qty: 180 | Fill #0

## 2020-11-06 MED FILL — ENOXAPARIN SODIUM 40 MG/0.4: 40 | 30 days supply | Qty: 12 | Fill #0

## 2020-11-06 NOTE — Progress Notes (Signed)
Recreational Therapy Session Note  Patient Details  Name: Lexy Meininger MRN: [id number] Date of Birth: 03-26-1956 Today's Date: 11/06/2020  Pain: no c/o Skilled Therapeutic Interventions/Progress Updates: Session focused on discharge planning, coping strategies as she transitions home with family.  Pt discusses potential obstacles once home and problems solved through solutions.  Discussed importance of leisure and it's impact on continued recovery.  Reviewed activity modifications and energy conservation.  Pt stated understanding and appreciation of all the care and support she has received from staff throughout her LOS.  Pt is Mod I once seated in w/c for simple tasks.  Therapy/Group: Individual Therapy   Ezekiah Massie 11/06/2020, 1:44 PM

## 2020-11-06 NOTE — Progress Notes (Signed)
Occupational Therapy Session Note  Patient Details  Name: Mary Davila MRN: [id number] Date of Birth: April 05, 1956  Today's Date: 11/06/2020 OT Individual Time: 1000-1100 OT Individual Time Calculation (min): 60 min    Short Term Goals: Week 4:  OT Short Term Goal 1 (Week 4): STG=LTG secondary to ELOS  Skilled Therapeutic Interventions/Progress Updates:    Patient seated in w/c, notes mild pain in back that is under control at this time.  She requests a shower for her last OT session.   Patient able to doff all clothing at w/c level with exception of teds (max A).  Able to propel w/c into bathroom and set up next to shower bench.  Sit pivot transfer to/from shower bench and w/c with CS.  She completes shower seated on shower bench, hand held shower and long handled sponge CS/set up.  Dressing completed w/c level - OH shirt independent, pull up/pants mod I, occ assistance needed for pants over hips with lateral leans, max A to donn teds, she is able to donn slipper socks using sock aide mod I.  Reviewed set up and adl process in home environment.  She is pleased with her progress and states that she is ready for discharge to home tomorrow.  She remained seated in w/c at close of session, call bell and tray table in reach.    Therapy Documentation Precautions:  Precautions Precautions: Back,Fall Precaution Booklet Issued: No Precaution Comments: Reviewed back precautions Required Braces or Orthoses:  (no brace needed) Other Brace: no brace needed per chart/orders Restrictions Weight Bearing Restrictions: No   Therapy/Group: Individual Therapy  Barrie Lyme 11/06/2020, 7:42 AM

## 2020-11-06 NOTE — Progress Notes (Signed)
[facility] PHYSICAL MEDICINE & REHABILITATION PROGRESS NOTE   Subjective/Complaints:  Pt reports still upset needs Lovenox at d/c- explained ASA or other PO meds haven't been shown to reduce risk of DVTs/PEs, so that's why we have her on Lovenox.  She appeared to hear that more today.   Back still hurting- is willing to go home on Skelaxin and tylenol, but if we cannot get Skelaxin, doesn't want other muscle relaxant- since they caused sedation (doesn't have spasticity, so Baclofen and Zanaflex not appropriate- also her BP is not appropriate for Zanaflex).   Asking about AFOs that ar ein closet- wondering why has them- hasn't used so far.    ROS:   Pt denies SOB, abd pain, CP, N/V/C/D, and vision changes    Objective:   No results found. No results for input(s): WBC, HGB, HCT, PLT in the last 72 hours. No results for input(s): NA, K, CL, CO2, GLUCOSE, BUN, CREATININE, CALCIUM in the last 72 hours.  Intake/Output Summary (Last 24 hours) at 11/06/2020 1017 Last data filed at 11/06/2020 0916 Gross per 24 hour  Intake 800 ml  Output --  Net 800 ml        Physical Exam: Vital Signs Blood pressure (!) 144/80, pulse 81, temperature 98.4 F (36.9 C), resp. rate 16, height 5' 5.5" (1.664 m), weight 67.2 kg, SpO2 100 %. Constitutional: sitting up in w/c at bedside, appropriate, less unhappy, NAD HEENT: EOMI, oral membranes moist Neck: supple Cardiovascular: . RRR- no JVD Respiratory/Chest: CTA B/L- no W/R/R- good air movement GI/Abdomen: Soft, NT, ND, (+)BS   Ext: no clubbing, cyanosis, or edema Psych: appropriate- but unhappy about Lovenox still Skin: Warm and dry.   Back incision CDI- healed.  Musc: pain with palpation in right low back, tender along posterior obliques, lower lat- still tight Neuro: Motor: B/l UE: 5/5 proximal to distal RLE: HF 2-/5, KE 3+/5, R DF 2/5 now- better- wiggling toes LLE- HF 2-/5, KE 3+/5, 0/5 DF--can wiggle 4th/5th digit on L foot- otherwise,  no change  Assessment/Plan: 1. Functional deficits which require 3+ hours per day of interdisciplinary therapy in a comprehensive inpatient rehab setting.  Physiatrist is providing close team supervision and 24 hour management of active medical problems listed below.  Physiatrist and rehab team continue to assess barriers to discharge/monitor patient progress toward functional and medical goals  Care Tool:  Bathing    Body parts bathed by patient: Right arm,Left arm,Chest,Abdomen,Front perineal area,Right upper leg,Left upper leg,Right lower leg,Left lower leg,Face,Buttocks   Body parts bathed by helper: Buttocks Body parts n/a: Buttocks   Bathing assist Assist Level: Contact Guard/Touching assist     Upper Body Dressing/Undressing Upper body dressing Upper body dressing/undressing activity did not occur (including orthotics): Safety/medical concerns What is the patient wearing?: Pull over shirt    Upper body assist Assist Level: Independent    Lower Body Dressing/Undressing Lower body dressing      What is the patient wearing?: Pants,Underwear/pull up     Lower body assist Assist for lower body dressing: Supervision/Verbal cueing     Toileting Toileting Toileting Activity did not occur (Clothing management and hygiene only): N/A (no void or bm)  Toileting assist Assist for toileting: Minimal Assistance - Patient > 75%     Transfers Chair/bed transfer  Transfers assist  Chair/bed transfer activity did not occur: Safety/medical concerns  Chair/bed transfer assist level: Supervision/Verbal cueing Chair/bed transfer assistive device: Sliding board   Locomotion Ambulation   Ambulation assist   Ambulation  activity did not occur: Safety/medical concerns          Walk 10 feet activity   Assist  Walk 10 feet activity did not occur: Safety/medical concerns        Walk 50 feet activity   Assist Walk 50 feet with 2 turns activity did not occur:  Safety/medical concerns         Walk 150 feet activity   Assist Walk 150 feet activity did not occur: Safety/medical concerns         Walk 10 feet on uneven surface  activity   Assist Walk 10 feet on uneven surfaces activity did not occur: Safety/medical concerns         Wheelchair     Assist Will patient use wheelchair at discharge?: Yes Type of Wheelchair: Manual    Wheelchair assist level: Independent Max wheelchair distance: 150    Wheelchair 50 feet with 2 turns activity    Assist        Assist Level: Independent   Wheelchair 150 feet activity     Assist      Assist Level: Independent   Blood pressure (!) 144/80, pulse 81, temperature 98.4 F (36.9 C), resp. rate 16, height 5' 5.5" (1.664 m), weight 67.2 kg, SpO2 100 %.  Medical Problem List and Plan: 1.  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   2/9- d/w nursing- will use pad, so pt doesn't get claustrophobia from fall seat belt-   2/16- pt refusing prevalon boots- explained the reasoning behind using them and would prefer her to at least wear L side, but can only inform her of reasoning.   Continue CIR 2.  Antithrombotics: -DVT/anticoagulation:  Pharmaceutical: Lovenox  Dopplers B/L neg for DVT  Will need to send home on Lovenox for a total of 3 months to prevent DVT/PE  2/16- pt taught to do lovenox yesterday- is very upset will need to do Lovenox- will need a total of 3 months from 2nd surgery- went over 89% chance of DVT/PE in first 90 days without Lovenox and 1% if does take Lovenox  2/17- explained ASA or PO meds do not cover /prevent DVT/PE like Lovenox does             -antiplatelet therapy: N/a 3. Pain Management:   hydrocodone prn  Keppra to 500 mg BID for nerve  pain  Started Vit B complex to help with mood  Changed Flexeril to Skelaxin due to severe sedation with Flexeril/Robaxin.   2/10- having jitteriness from she thinks, the Keppra- will hold for a few days, per her request- also, pt refused keppra this AM-   2/11- Keppra stopped  2/13 add kpad for right LBP, likely muscle strain vs referred from spine  2/14- pt doesn't want any additional meds- con't tylenol and skelaxin prn  2/16- con't current regimen pt refusing other meds   2/17- trying to get skelaxin covered by insurance for pt.  4. Mood: LCSW to follow for evaluation and support.   2/12- continue paxil, mood better off keppra             -antipsychotic agents:  N/A 5. Neuropsych: This patient is capable of making decisions on her own behalf. 6. Skin/Wound Care: Monitor wound for healing.     back wound healing nicely  7. Fluids/Electrolytes/Nutrition: Monitor I/Os.  8.  HTN: Monitor BP    Vitals:   11/05/20 2056 11/06/20 0506  BP: (!) 143/85 (!) 144/80  Pulse: 72 81  Resp: 18 16  Temp: 98.5 F (36.9 C) 98.4 F (36.9 C)  SpO2: 100% 100%   Low dose Norvasc and hasn't required Midodrine yet.   2/7- BP 140s/80s- no orthostasis- will monitor to make sure BP doesn't go too high on Norvasc 2.5 mg daily.   2/8- BP 140s/80s again- no orthostasis- con't regimen  2/9- BP 140s/150s- but when on Norvasc 5 mg, BP goes too low- pt refuses other BP meds.  2/10- BP high again this AM 153/99- higher than normal for her- if doesn't come down tomorrow, will have to increase Norvasc.    2/11- BP 168/90 - will increase Norvasc to 5 mg and monitor closely.   2/16- Norvasc still at 10 mg- pt doesn't want additional meds- will con't Norvasc  2/17- BP 140s/80s- con't regimen- pt doesn't want additional meds 9. Hypokalemia:   Kdur 40 meq bid  K+ 4.5 on 2/1, cont to monitor  2/7- K+ 3.7- con't regimen 10. Mild anemia:   Hb 9.5 on 1/31 on 2/5, labs ordered for Monday  2/7- Hb 10.0- doing better 11.  COPD: Respiratory status stable on Dulera bid. Albuterol prn SOB.   2/10- resp treatments keeping things stable 12. Neurogenic bladder:    Improving  2/7- voiding on own OFF Flomax  2/9- voiding on own- basically zero bladder scans post void.   ?Bladder spasms vs , ZPP dysesthesia 13. Neurogenic bowel:   Started bowel program this evening- discussed at length with pt about bowel program.  ?Improving  2/7- having BMs on toilet- hasn't required bowel program since last week-   2/9-13 continues to have frequent small to med BMs, continent  2/15- small frequent BMs- con't regimen with PO meds 14. At level SCI pain-  Keppra 250 mg BID for nerve pain  Changed Gabapentin to QHS per pt request  2/10- will hold Keppra x 3 days to see if the cause of being jittery- if so, will need to try another medicine-   2/12- pt off keppra now, anxiety better. No new reports of pain   -monitor.  15. Low grade fever this AM: Resolved  U/A on 1/25- (+) but had 30k Ecoli- sensitive to everything- Sx's improving- wait on starting ABX since not 100k EColi.  16. Severe depression  2/17- started Paxil, but pt doesn't want to go home on Paxil- feels it's "causing Depression"- most likely, the SCI and friend's death since came to rehab are contributing to this issue, but pt is adult and can make her decisions about  17. Peripheral edema  Dopplers (-) likely due to Norvasc AND LE weakness combined to cause swelling- needs TEDs.   Added ACE wraps over TEDs  2/7- doing better- con't regimen  2/14- trace LLE edema- otherwise resolved  2/15- refused prevalon boots last night- will encourage pt to use at least on LLE 18. Dispo  2/15- d/c Friday- will need Lovenox teaching prior- have ordered- waiting for nursing to teach- Spoke with nurse- will occur today  2/16- pt upset, but did explain reasoning behind Lovenox and Prevalon boots-   2/17- have called sister to go over medical issues.    Pt has been seen by [employer]  for manual ultra light w/c- she meets criteria for this due to being an incomplete paraplegic- and not walking and not  sure if she will walk more than short distances in future- she needs ultra light weight since it will allow her to propel w/c herself and be more independent- she has LE edema, and ideally would have elevating leg rests, if possible, however also at risk for developing spasticity, and will also need pressure relieving w/c cushion- Jay cushion line would be perfect;  so she can help prevent pressure ulcers which are so frequent in SCI patients- She also will need arm rests to do pressure relief to help prevent those ulcers.  Will try and see if they can get her loaner w/c until she  get new w/c- which she more than qualifies for.    35 minutes total care on pt today- >50% spent on coordination of care.  Called sister to discuss medical issues including Paxil, Prevalon boots, and Skelaxin and where to get it for less- [employer], [employer] or [employer] were the least expensive. Insurance right now is saying 100-200 for 1 month, which of course isn't possible, so trying to get it for at least 1 month supply.   LOS: 30 days A FACE TO FACE EVALUATION WAS PERFORMED  Mary Davila 11/06/2020, 10:17 AM

## 2020-11-06 NOTE — Progress Notes (Signed)
Physical Therapy Session Note  Patient Details  Name: Mary Davila MRN: [id number] Date of Birth: 07-12-1956  Today's Date: 11/06/2020 PT Individual Time: 1440-1540 PT Individual Time Calculation (min): 60 min   Short Term Goals: Week 4:  PT Short Term Goal 1 (Week 4): =LTG due to ELOS  Skilled Therapeutic Interventions/Progress Updates: Pt presented in w/c agreeable to therapy but requesting to use bathroom prior to leaving room. Pt performed [place] transfer with CGA with pt being able to pull self up with CGA and manage LB clothing with CGA (+BM). Pt was able to stand in [place] and perform peri-care with supervision. Pt then transferred to bed and pt was able to perform sit to/from supine bed mobility with supervision and use of bed features. Pt was able to use lower abdominals to perform near sit up and pull BLE onto bed. Once back in sitting pt performed squat pivot transfer to w/c with supervision. Pt then propelled to rehab gym mod I and performed squat pivot transfer to mat with pt being mod I for breaks and parts management. Pt participated in dynamic sitting balance activities including ball toss off rebounder 2 x 10 with 1Kg weighted ball. Pt and PTA discussed anticipation of d/c and PTA encouraged use of HH PT/OT to continue to promote independence in home and ask questions regarding any set up or modifications that needed to be made. Pt returned to w/c in same manner as prior and pt propelled back to room mod I. Pt left in room at end of session with call bell within reach and current needs met.      Therapy Documentation  Precautions:  Precautions Precautions: Back,Fall Precaution Booklet Issued: No Precaution Comments: Reviewed back precautions Required Braces or Orthoses:  (no brace needed) Other Brace: no brace needed per chart/orders Restrictions Weight Bearing Restrictions: No General:   Vital Signs: Therapy Vitals Temp: 98.4 F (36.9 C) Temp Source: Oral Pulse Rate:  74 Resp: 18 BP: (!) 146/85 Patient Position (if appropriate): Sitting Oxygen Therapy SpO2: 100 % O2 Device: Room Air Pain: Pain Assessment Pain Score: 4  Mobility:   Locomotion :    Trunk/Postural Assessment :    Balance:   Exercises:   Other Treatments:      Therapy/Group: Individual Therapy  Tanaia Hawkey 11/06/2020, 4:51 PM

## 2020-11-06 NOTE — Progress Notes (Signed)
Physical Therapy Session Note  Patient Details  Name: Mary Davila MRN: [id number] Date of Birth: 12-Nov-1955  Today's Date: 11/06/2020 PT Individual Time: 0800-0905 PT Individual Time Calculation (min): 65 min   Short Term Goals: Week 4:  PT Short Term Goal 1 (Week 4): =LTG due to ELOS  Skilled Therapeutic Interventions/Progress Updates:    Patient received sitting up in wc, agreeable to PT. She continues to report 7/10 pain in B LE and low back, premedicated. PT providing rest breaks, distractions and repositioning to assist with pain management. She was able to propel herself to the therapy gym ModI and transfer to therapy mat via slideboard with supervision, including for set up. Anticipatory and reactive sitting balance task with no LE support, batting ball with 2# dowel. Patient with appropriate reactive balance strategies progressing to sitting on air cushion for increased difficulty. More LOB noted, but again, appropriate righting reactions displayed. Patient able to kick ball back and forth with B LE sitting unsupported. PT performing prolonged gastroc stretch B LE 3x30s. UE ergometer 2x1 min forward, 2x1 min backward with increased core activation to minimize spinal rotation. Patient returning to room in [place], [place] used to transfer to toilet. Supervision for clothing management in standing. Patient remaining in bathroom, pull string within reach, RN aware of patients location.   Therapy Documentation Precautions:  Precautions Precautions: Back,Fall Precaution Booklet Issued: No Precaution Comments: Reviewed back precautions Required Braces or Orthoses:  (no brace needed) Other Brace: no brace needed per chart/orders Restrictions Weight Bearing Restrictions: No    Therapy/Group: Individual Therapy  Elizebeth Koller, PT, DPT, CBIS  11/06/2020, 7:39 AM

## 2020-11-06 NOTE — Progress Notes (Signed)
Recreational Therapy Discharge Summary Patient Details  Name: Mary Davila MRN: [id number] Date of Birth: 20-Mar-1956 Today's Date: 11/06/2020  Long term goals set: 1  Long term goals met: 1  Comments on progress toward goals: Pt has made excellent progress during LOS and is discharging home tomorrow with family to provide 24 hours supervision/assitacne.  TR sessions have focused on leisure education, activity analysis/identification of potential modifications, energy conservation techniques, relaxation strategies/coping.  Pt is supervision/set up assist level for seated or bed level tasks, but Mod I for simple tasks once in w/c.  Goal met.  Reasons for discharge: discharge from hospital  Patient/family agrees with progress made and goals achieved: Yes  Mary Davila 11/06/2020, 1:52 PM

## 2020-11-06 NOTE — Progress Notes (Signed)
Physical Therapy Discharge Summary  Patient Details  Name: Mary Davila MRN: [id number] Date of Birth: 08-09-1956  Today's Date: 11/06/2020   Patient has met 7 of 8 long term goals due to improved activity tolerance, improved balance, improved postural control, increased strength, decreased pain, improved attention and improved awareness.  Patient to discharge at a wheelchair level Supervision.   Patient's care partner is independent to provide the necessary physical assistance at discharge.  Reasons goals not met: Pt making progress but unable at this time to stand without assistance of mechanical lift Charlaine Dalton)  Recommendation:  Patient will benefit from ongoing skilled PT services in home health setting to continue to advance safe functional mobility, address ongoing impairments in balance, functional strength, transfers, wc mobility, gait progressions, and minimize fall risk.  Equipment: Ultralight 18x18 w/c, 30in slideboard, and hospital bed  Reasons for discharge: treatment goals met  Patient/family agrees with progress made and goals achieved: Yes  PT Discharge Precautions/Restrictions Precautions Precautions: Back;Fall Precaution Booklet Issued: No Precaution Comments: Reviewed back precautions Other Brace: no brace needed per chart/orders Restrictions Weight Bearing Restrictions: No Vision/Perception  Perception Perception: Within Functional Limits Praxis Praxis: Intact  Cognition Overall Cognitive Status: Within Functional Limits for tasks assessed Arousal/Alertness: Awake/alert Selective Attention: Appears intact Memory: Appears intact Awareness: Appears intact Problem Solving: Appears intact Safety/Judgment: Appears intact Sensation Sensation Light Touch: Impaired Detail Central sensation comments: hyper sensitivity R ant ankle, absent light touch R lateral/posterior hip Light Touch Impaired Details: Impaired LLE;Impaired RLE Hot/Cold: Appears Intact Motor   Motor Motor: Paraplegia;Abnormal tone;Abnormal postural alignment and control Motor - Discharge Observations: Some BLE ms activation present more flexors then extensors  Mobility Bed Mobility Bed Mobility: Supine to Sit;Sit to Supine Supine to Sit: Supervision/Verbal cueing Sit to Supine: Supervision/Verbal cueing Transfers Transfers: Transfer via Lift Equipment;Squat Pivot Transfers Sit to Stand: Dependent - mechanical lift Stand to Sit: Dependent - mechanical lift Squat Pivot Transfers: Supervision/Verbal cueing Transfer (Assistive device): Other (Comment) Transfer via Lift Equipment: Animal nutritionist: No Gait Gait: No Stairs / Additional Locomotion Stairs: No Architect: Yes Wheelchair Assistance: Independent with Camera operator: Both upper extremities Wheelchair Parts Management: Independent Distance: >177f  Trunk/Postural Assessment  Cervical Assessment Cervical Assessment: Within Functional Limits Thoracic Assessment Thoracic Assessment:  (back precautions) Lumbar Assessment Lumbar Assessment:  (back precautions) Postural Control Postural Control: Within Functional Limits Trunk Control: significantly improved from eval  Balance Static Sitting Balance Static Sitting - Balance Support: Bilateral upper extremity supported;Feet supported Static Sitting - Level of Assistance: 6: Modified independent (Device/Increase time) Dynamic Sitting Balance Dynamic Sitting - Balance Support: Feet supported Dynamic Sitting - Level of Assistance: 5: Stand by assistance Dynamic Sitting - Balance Activities: Ball toss;Reaching for weighted objects Sitting balance - Comments: able to use rebounder with weighed ball Extremity Assessment      RLE Assessment RLE Assessment: Exceptions to [facility] Strength Right Hip Flexion: 3-/5 Right Knee Flexion: 3-/5 Right Knee Extension: 4/5 Right Ankle Dorsiflexion:  3-/5 Right Ankle Plantar Flexion: 1/5 LLE Assessment LLE Assessment: Exceptions to [facility] Strength Left Hip Flexion: 3-/5 Left Knee Flexion: 2+/5 Left Knee Extension: 4/5 Left Ankle Dorsiflexion: 0/5 Left Ankle Plantar Flexion: 0/5   JDebbora Dus PT, DPT, CBIS 11/07/2020 8:13AM Mary Davila 11/06/2020, 7:07 PM

## 2020-11-06 NOTE — Progress Notes (Signed)
Physical Therapy Session Note  Patient Details  Name: Mary Davila MRN: [id number] Date of Birth: [birth year]  Today's Date: 11/06/2020 PT Individual Time: 1130-1200 PT Individual Time Calculation (min): 30 min   Short Term Goals: Week 1:  PT Short Term Goal 1 (Week 1): Pt will perform bed mobility with mod A consistently PT Short Term Goal 1 - Progress (Week 1): Met PT Short Term Goal 2 (Week 1): Pt will perform least restrictive transfer with mod A PT Short Term Goal 2 - Progress (Week 1): Met PT Short Term Goal 3 (Week 1): Pt will perform w/c mobility x 100 ft with Supervision PT Short Term Goal 3 - Progress (Week 1): Met PT Short Term Goal 4 (Week 1): Pt will initiate gait training as safe and able PT Short Term Goal 4 - Progress (Week 1): Not met Week 2:  PT Short Term Goal 1 (Week 2): Pt will perform bed mobility with min A consistently PT Short Term Goal 1 - Progress (Week 2): Progressing toward goal PT Short Term Goal 2 (Week 2): Pt will perform least restrictive transfer with min A consistently PT Short Term Goal 2 - Progress (Week 2): Met PT Short Term Goal 3 (Week 2): Pt will recall and be able to perform 2 pressure relief techniques PT Short Term Goal 3 - Progress (Week 2): Met Week 3:  PT Short Term Goal 1 (Week 3): Pt will perform bed mobility with min A consistently PT Short Term Goal 1 - Progress (Week 3): Met PT Short Term Goal 2 (Week 3): Pt will be independent with adaptive equipment for bed mobility and transfers. PT Short Term Goal 2 - Progress (Week 3): Progressing toward goal PT Short Term Goal 3 (Week 3): Pt will maintain dynamic sitting balance with close supervision x5 minutes. PT Short Term Goal 3 - Progress (Week 3): Met  Skilled Therapeutic Interventions/Progress Updates:    PAIN 7/10 r low back region.  Treatment to tolerance.  Seated rest breaks as needed.  Pt seen this am for 30 min session w/focus on standing/core activation/wbing per pt request. Pt  propels wc 149f x 2 mod I to/from gym.  Sit to stand in [place] w/UE assist then tranfers hands to therapist shoulders (in front of pt) and worked on core/quad/glut/hip mm activation in standing via isometrics applied at hips. Worked on upright w/multimodal cues to improve posture/glut activation. Repeated standing up to 1.5 min x 5, seated rest semi sitting in steady, standing tolerance limited by LBP. At end of session Pt left oob in wc w/alarm belt set and needs in reach  Therapy Documentation Precautions:  Precautions Precautions: Back,Fall Precaution Booklet Issued: No Precaution Comments: Reviewed back precautions Required Braces or Orthoses:  (no brace needed) Other Brace: no brace needed per chart/orders Restrictions Weight Bearing Restrictions: No    Therapy/Group: Individual Therapy  BCallie Fielding PMifflin[place]2/17/2022, 12:16 PM

## 2020-11-07 NOTE — Progress Notes (Signed)
Patient discharged off of unit with all belongings. Discharge papers/instructions explained by physician assistant to family. Patient and family have no further questions at time of discharge. No complications noted at this time.  Melven Stockard L Seddrick Flax  

## 2020-11-07 NOTE — Discharge Instructions (Signed)
Inpatient Rehab Discharge Instructions  Mary Davila Discharge date and time:  11/07/20  Activities/Precautions/ Functional Status: Activity: no lifting, driving, or strenuous exercise till cleared by MD Diet: regular diet Wound Care: keep wound clean and dry    Functional status:  ___ No restrictions     ___ Walk up steps independently _X__ 24/7 supervision/assistance   ___ Walk up steps with assistance ___ Intermittent supervision/assistance  ___ Bathe/dress independently ___ Walk with walker     _X__ Bathe/dress with assistance ___ Walk Independently    ___ Shower independently ___ Walk with assistance    ___ Shower with assistance _X__ No alcohol     ___ Return to work/school ________    COMMUNITY REFERRALS UPON DISCHARGE:    Home Health:   PT     OT   SNA                Agency: [facility] Phone: [phone] *Please expect follow-up within 2-3 days after discharge about scheduling your home visit. If you have not received follow-up, please be sure to contact the branch directly.*   Medical Equipment/Items Ordered: hospital bed, transfer board, drop arm bedside commode, tub transfer bench                                                 Agency/Supplier: [employer] [facility] [phone]    Special Instructions:    My questions have been answered and I understand these instructions. I will adhere to these goals and the provided educational materials after my discharge from the hospital.  Patient/Caregiver Signature _______________________________ Date __________  Clinician Signature _______________________________________ Date __________  Please bring this form and your medication list with you to all your follow-up doctor's appointments.

## 2020-11-07 NOTE — Plan of Care (Signed)
  Problem: Consults Goal: RH SPINAL CORD INJURY PATIENT EDUCATION Description:  See Patient Education module for education specifics.  Outcome: Completed/Met   Problem: SCI BOWEL ELIMINATION Goal: RH STG MANAGE BOWEL WITH ASSISTANCE Description: STG Manage Bowel with mod  I Assistance. Outcome: Completed/Met Goal: RH STG SCI MANAGE BOWEL PROGRAM W/ASSIST OR AS APPROPRIATE Description: STG SCI Manage bowel program with mod I assist Outcome: Completed/Met   Problem: SCI BLADDER ELIMINATION Goal: RH STG SCI MANAGE BLADDER PROGRAM W/ASSISTANCE Description: Manage bladder program with min assist Outcome: Completed/Met   Problem: RH PAIN MANAGEMENT Goal: RH STG PAIN MANAGED AT OR BELOW PT'S PAIN GOAL Description: Pain level less than 4 on scale of 0-10 Outcome: Completed/Met   Problem: RH KNOWLEDGE DEFICIT SCI Goal: RH STG INCREASE KNOWLEDGE OF SELF CARE AFTER SCI Description: Pt will be able to demonstrate understanding of bowel and bladder management following SCI with min assist using handouts provided. Pt will be able to perform own bowel program independently upon discharge.  Outcome: Completed/Met

## 2020-11-07 NOTE — Progress Notes (Signed)
[facility] PHYSICAL MEDICINE & REHABILITATION PROGRESS NOTE   Subjective/Complaints:  Pt reports she's ready to go home.  Sounds like AFOs were made for her by Acute hospital- so they are hers to take home.   Asking d/c times- 10-12 and about d/c meds- went over.    ROS:   Pt denies SOB, abd pain, CP, N/V/C/D, and vision changes   Objective:   No results found. No results for input(s): WBC, HGB, HCT, PLT in the last 72 hours. No results for input(s): NA, K, CL, CO2, GLUCOSE, BUN, CREATININE, CALCIUM in the last 72 hours.  Intake/Output Summary (Last 24 hours) at 11/07/2020 0837 Last data filed at 11/07/2020 0813 Gross per 24 hour  Intake 1542 ml  Output --  Net 1542 ml        Physical Exam: Vital Signs Blood pressure 135/89, pulse 91, temperature 98.3 F (36.8 C), temperature source Oral, resp. rate 18, height 5' 5.5" (1.664 m), weight 67.2 kg, SpO2 100 %. Constitutional: sitting in manual w/c, appropriate, NAD HEENT: EOMI, oral membranes moist Neck: supple Cardiovascular: . RRR- no JVD Respiratory/Chest: CTA B/L- no W/R/R- good air movement GI/Abdomen: Soft, NT, ND, (+)BS  Ext: no clubbing, cyanosis, or edema Psych: appropriate- ready for d/c.  Skin: Warm and dry.   Back incision CDI- healed.  Musc: pain with palpation in right low back, tender along posterior obliques, lower lat- still tight Neuro: Motor: B/l UE: 5/5 proximal to distal RLE: HF 2-/5, KE 3+/5, R DF 2/5 now- better- wiggling toes LLE- HF 2-/5, KE 3+/5, 0/5 DF--can wiggle 4th/5th digit on L foot- otherwise, no change  Assessment/Plan: 1. Functional deficits which require 3+ hours per day of interdisciplinary therapy in a comprehensive inpatient rehab setting.  Physiatrist is providing close team supervision and 24 hour management of active medical problems listed below.  Physiatrist and rehab team continue to assess barriers to discharge/monitor patient progress toward functional and medical  goals  Care Tool:  Bathing    Body parts bathed by patient: Right arm,Left arm,Chest,Abdomen,Front perineal area,Right upper leg,Left upper leg,Right lower leg,Left lower leg,Face,Buttocks   Body parts bathed by helper: Buttocks Body parts n/a: Buttocks   Bathing assist Assist Level: Supervision/Verbal cueing     Upper Body Dressing/Undressing Upper body dressing Upper body dressing/undressing activity did not occur (including orthotics): Safety/medical concerns What is the patient wearing?: Pull over shirt    Upper body assist Assist Level: Independent    Lower Body Dressing/Undressing Lower body dressing      What is the patient wearing?: Underwear/pull up,Pants     Lower body assist Assist for lower body dressing: Supervision/Verbal cueing     Toileting Toileting Toileting Activity did not occur (Clothing management and hygiene only): N/A (no void or bm)  Toileting assist Assist for toileting: Minimal Assistance - Patient > 75%     Transfers Chair/bed transfer  Transfers assist  Chair/bed transfer activity did not occur: Safety/medical concerns  Chair/bed transfer assist level: Supervision/Verbal cueing Chair/bed transfer assistive device: Sliding board   Locomotion Ambulation   Ambulation assist   Ambulation activity did not occur: Safety/medical concerns          Walk 10 feet activity   Assist  Walk 10 feet activity did not occur: Safety/medical concerns        Walk 50 feet activity   Assist Walk 50 feet with 2 turns activity did not occur: Safety/medical concerns         Walk 150 feet activity  Assist Walk 150 feet activity did not occur: Safety/medical concerns         Walk 10 feet on uneven surface  activity   Assist Walk 10 feet on uneven surfaces activity did not occur: Safety/medical concerns         Wheelchair     Assist Will patient use wheelchair at discharge?: Yes Type of Wheelchair: Manual     Wheelchair assist level: Independent Max wheelchair distance: 150    Wheelchair 50 feet with 2 turns activity    Assist        Assist Level: Independent   Wheelchair 150 feet activity     Assist      Assist Level: Independent   Blood pressure 135/89, pulse 91, temperature 98.3 F (36.8 C), temperature source Oral, resp. rate 18, height 5' 5.5" (1.664 m), weight 67.2 kg, SpO2 100 %.  Medical Problem List and Plan: 1.  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   2/9- d/w nursing- will use pad, so pt doesn't get claustrophobia from fall seat belt-   2/16- pt refusing prevalon boots- explained the reasoning behind using them and would prefer her to at least wear L side, but can only inform her of reasoning.   Continue CIR 2.  Antithrombotics: -DVT/anticoagulation:  Pharmaceutical: Lovenox  Dopplers B/L neg for DVT  Will need to send home on Lovenox for a total of 3 months to prevent DVT/PE  2/16- pt taught to do lovenox yesterday- is very upset will need to do Lovenox- will need a total of 3 months from 2nd surgery- went over 89% chance of DVT/PE in first 90 days without Lovenox and 1% if does take Lovenox  2/17- explained ASA or PO meds do not cover /prevent DVT/PE like Lovenox does  2/18- going home on Lovenox 3 months from day of 2nd surgery             -antiplatelet therapy: N/a 3. Pain Management:   hydrocodone prn  Keppra to 500 mg BID for nerve pain  Started Vit B complex to help with mood  Changed Flexeril to Skelaxin due to severe sedation with Flexeril/Robaxin.   2/10- having jitteriness from she thinks, the Keppra- will hold for a few days, per her request- also, pt refused keppra this AM-   2/11- Keppra stopped  2/13 add kpad for right LBP, likely  muscle strain vs referred from spine  2/14- pt doesn't want any additional meds- con't tylenol and skelaxin prn  2/16- con't current regimen pt refusing other meds   2/17- trying to get skelaxin covered by insurance for pt.  2/18- Pam was able to get it covered by insurance  4. Mood: LCSW to follow for evaluation and support.   2/12- continue paxil, mood better off keppra             -antipsychotic agents:  N/A 5. Neuropsych: This patient is capable of making decisions on her own behalf. 6. Skin/Wound Care: Monitor wound for healing.     back wound healing nicely 7. Fluids/Electrolytes/Nutrition: Monitor I/Os.  8.  HTN: Monitor BP    Vitals:   11/06/20 2021 11/07/20 0458  BP:  135/89  Pulse:  91  Resp:    Temp:  98.3 F (36.8 C)  SpO2: 96% 100%   Low  dose Norvasc and hasn't required Midodrine yet.   2/7- BP 140s/80s- no orthostasis- will monitor to make sure BP doesn't go too high on Norvasc 2.5 mg daily.   2/8- BP 140s/80s again- no orthostasis- con't regimen  2/9- BP 140s/150s- but when on Norvasc 5 mg, BP goes too low- pt refuses other BP meds.  2/10- BP high again this AM 153/99- higher than normal for her- if doesn't come down tomorrow, will have to increase Norvasc.    2/11- BP 168/90 - will increase Norvasc to 5 mg and monitor closely.   2/16- Norvasc still at 10 mg- pt doesn't want additional meds- will con't Norvasc  2/17- BP 140s/80s- con't regimen- pt doesn't want additional meds 9. Hypokalemia:   Kdur 40 meq bid  K+ 4.5 on 2/1, cont to monitor  2/7- K+ 3.7- con't regimen 10. Mild anemia:   Hb 9.5 on 1/31 on 2/5, labs ordered for Monday  2/7- Hb 10.0- doing better 11. COPD: Respiratory status stable on Dulera bid. Albuterol prn SOB.   2/10- resp treatments keeping things stable 12. Neurogenic bladder:    Improving  2/7- voiding on own OFF Flomax  2/9- voiding on own- basically zero bladder scans post void.   ?Bladder spasms vs , ZPP dysesthesia 13. Neurogenic  bowel:   Started bowel program this evening- discussed at length with pt about bowel program.  ?Improving  2/7- having BMs on toilet- hasn't required bowel program since last week-   2/9-13 continues to have frequent small to med BMs, continent  2/15- small frequent BMs- con't regimen with PO meds 14. At level SCI pain-  Keppra 250 mg BID for nerve pain  Changed Gabapentin to QHS per pt request  2/10- will hold Keppra x 3 days to see if the cause of being jittery- if so, will need to try another medicine-   2/12- pt off keppra now, anxiety better. No new reports of pain   -monitor.  15. Low grade fever this AM: Resolved  U/A on 1/25- (+) but had 30k Ecoli- sensitive to everything- Sx's improving- wait on starting ABX since not 100k EColi.  16. Severe depression  2/17- started Paxil, but pt doesn't want to go home on Paxil- feels it's "causing Depression"- most likely, the SCI and friend's death since came to rehab are contributing to this issue, but pt is adult and can make her decisions about   2/18- pt doesn't want to go home with Paxil 17. Peripheral edema  Dopplers (-) likely due to Norvasc AND LE weakness combined to cause swelling- needs TEDs.   Added ACE wraps over TEDs  2/7- doing better- con't regimen  2/14- trace LLE edema- otherwise resolved  2/15- refused prevalon boots last night- will encourage pt to use at least on LLE 18. Dispo  2/15- d/c Friday- will need Lovenox teaching prior- have ordered- waiting for nursing to teach- Spoke with nurse- will occur today  2/16- pt upset, but did explain reasoning behind Lovenox and Prevalon boots-   2/17- have called sister to go over medical issues.   2/18- d/c today   Pt has been seen by [employer] for manual ultra light w/c- she meets criteria for this due to being an incomplete paraplegic- and not walking and not sure if she will walk more than short distances in future- she needs ultra light weight since it will allow her to propel  w/c herself and be more independent- she has LE edema, and ideally would have  elevating leg rests, if possible, however also at risk for developing spasticity, and will also need pressure relieving w/c cushion- Jay cushion line would be perfect;  so she can help prevent pressure ulcers which are so frequent in SCI patients- She also will need arm rests to do pressure relief to help prevent those ulcers.  Will try and see if they can get her loaner w/c until she  get new w/c- which she more than qualifies for.      LOS: 31 days A FACE TO FACE EVALUATION WAS PERFORMED  Abdalrahman Clementson 11/07/2020, 8:37 AM

## 2020-11-10 NOTE — Discharge Summary (Signed)
Physician Discharge Summary  Patient ID: Mary Davila MRN: [id number] DOB/AGE: [birth year] 65 y.o.  Admit date: 10/07/2020 Discharge date: 11/07/2020  Discharge Diagnoses:  Principal Problem:   Acute incomplete paraplegia [facility]) Active Problems:   Lumbar disc herniation with myelopathy   Hypokalemia   Neurogenic bladder   Neurogenic bowel   Post-operative pain   Peripheral edema   Spinal cord injury of thoracic region without bone injury (HCC)   Muscle spasms of both lower extremities   Discharged Condition: stable   Significant Diagnostic Studies: DG Chest 2 View  Result Date: 10/21/2020 CLINICAL DATA:  Swelling of legs for 2 days, shortness of breath, recent spine surgery EXAM: CHEST - 2 VIEW COMPARISON:  None. FINDINGS: No consolidation, features of edema, pneumothorax, or effusion. Pulmonary vascularity is normally distributed. The cardiomediastinal contours are unremarkable. No acute osseous or soft tissue abnormality. Degenerative changes are present in the imaged spine and shoulders. Evidence of calcific tendinosis in the right shoulder. Lumbar fusion hardware is partially visualized within the margins of imaging without gross abnormality of the included hardware. IMPRESSION: No acute cardiopulmonary abnormality. Electronically Signed   By: Kreg Shropshire M.D.   On: 10/21/2020 19:26   VAS [place] LOWER EXTREMITY VENOUS (DVT)  Result Date: 10/20/2020  Lower Venous DVT Study Indications: Swelling, and Edema.  Comparison Study: 10/08/20 previous Performing Technologist: Blanch Media RVS  Examination Guidelines: A complete evaluation includes B-mode imaging, spectral Doppler, color Doppler, and power Doppler as needed of all accessible portions of each vessel. Bilateral testing is considered an integral part of a complete examination. Limited examinations for reoccurring indications may be performed as noted. The reflux portion of the exam is performed with the patient in reverse Trendelenburg.   +---------+---------------+---------+-----------+----------+--------------+ RIGHT    CompressibilityPhasicitySpontaneityPropertiesThrombus Aging +---------+---------------+---------+-----------+----------+--------------+ CFV      Full           Yes      Yes                                 +---------+---------------+---------+-----------+----------+--------------+ SFJ      Full                                                        +---------+---------------+---------+-----------+----------+--------------+ FV Prox  Full                                                        +---------+---------------+---------+-----------+----------+--------------+ FV Mid   Full                                                        +---------+---------------+---------+-----------+----------+--------------+ FV DistalFull                                                        +---------+---------------+---------+-----------+----------+--------------+  PFV      Full                                                        +---------+---------------+---------+-----------+----------+--------------+ POP      Full           Yes      Yes                                 +---------+---------------+---------+-----------+----------+--------------+ PTV      Full                                                        +---------+---------------+---------+-----------+----------+--------------+ PERO     Full                                                        +---------+---------------+---------+-----------+----------+--------------+   +---------+---------------+---------+-----------+----------+--------------+ LEFT     CompressibilityPhasicitySpontaneityPropertiesThrombus Aging +---------+---------------+---------+-----------+----------+--------------+ CFV      Full           Yes      Yes                                  +---------+---------------+---------+-----------+----------+--------------+ SFJ      Full                                                        +---------+---------------+---------+-----------+----------+--------------+ FV Prox  Full                                                        +---------+---------------+---------+-----------+----------+--------------+ FV Mid   Full                                                        +---------+---------------+---------+-----------+----------+--------------+ FV DistalFull                                                        +---------+---------------+---------+-----------+----------+--------------+ PFV      Full                                                        +---------+---------------+---------+-----------+----------+--------------+   POP      Full           Yes      Yes                                 +---------+---------------+---------+-----------+----------+--------------+ PTV      Full                                                        +---------+---------------+---------+-----------+----------+--------------+ PERO     Full                                                        +---------+---------------+---------+-----------+----------+--------------+     Summary: BILATERAL: - No evidence of deep vein thrombosis seen in the lower extremities, bilaterally. - No evidence of superficial venous thrombosis in the lower extremities, bilaterally. -No evidence of popliteal cyst, bilaterally.   *See table(s) above for measurements and observations. Electronically signed by Sherald Hess MD on 10/20/2020 at 4:02:22 PM.    Final     Labs:  Basic Metabolic Panel: BMP Latest Ref Rng & Units 11/03/2020 10/27/2020 10/21/2020  Glucose 70 - 99 mg/dL 656(C) 127(N) 93  BUN 8 - 23 mg/dL 11 11 17   Creatinine 0.44 - 1.00 mg/dL 1.70 0.17  Sodium 135 - 145 mmol/L 142 141 143  Potassium 3.5 - 5.1 mmol/L 3.7  3.7 4.5  Chloride 98 - 111 mmol/L 106 104 107  CO2 22 - 32 mmol/L 25 26 26   Calcium 8.9 - 10.3 mg/dL 9.9 9.2 9.7    CBC: CBC Latest Ref Rng & Units 11/03/2020 10/27/2020 10/20/2020  WBC 4.0 - 10.5 K/uL 4.1 3.7(L) 4.9  Hemoglobin 12.0 - 15.0 g/dL 10.9(L) 10.0(L) 9.5(L)  Hematocrit 36.0 - 46.0 % 32.9(L) 31.5(L) 28.3(L)  Platelets 150 - 400 K/uL 339 358 408(H)    CBG: No results for input(s): GLUCAP in the last 168 hours.  Brief HPI:   Mary Davila is a 65 y.o. female with history of HTN, DDD who underwent L1-L2 microdiscectomy at outpatient surgical center on 10/03/2019 but developed significant BLE weakness postop with numbness and tingling RLE.  She was transferred to [facility] for work-up and MRI spine showed large central disc extrusion with mass-effect on spinal cord now with severe left L4/L5 foraminal stenosis and incidental note made of massively distended urinary bladder.  Patient was noted to have dense paraplegia with symptoms felt to be due to recurrent/residual migration of disc at L1 with edema.  She was taken back to the OR for L1 laminectomy with complete facetectomy for decompression of thecal sac on 01/14 by Dr. [facility].    Foley was placed due to neurogenic bladder and she reported lack of BM during her stay.  Therapy was ongoing and working on balance at edge of bed.  She continued to be limited by BLE weakness with sensory deficits in foot drop, dizziness with standing attempts in STEDY as well as groin pain with neuropathy.  Patient with deficits ability to carry out ADLs and in mobility.  CIR was recommended due to functional decline.  Hospital Course: Mary Davila was admitted to rehab 10/07/2020 for inpatient therapies to consist of PT and OT at least three hours five days a week. Past admission physiatrist, therapy team and rehab RN have worked together to provide customized collaborative inpatient rehab.BLE dopplers done past admission were negative for DVT and she was  maintained on lovenox during her stay. She has been educated on need to continue this for 3 months to prevent DVT/PE and is able to self administer this. Prevalon boots were ordered for pressure relief measures but she has declined their use due to reports of discomfort. Cymbalta was added to help with neuropathy but patient declined this. Gabapentin was added and titrated upwards for symptom management but patient declined this due to concerns of SE. Blood pressures were monitored on TID basis and have been relatively controlled. Low dose Norvasc was discontinued to due to BLE edema and gabapentin was decreased to 300 mg/HS per request. She has been educated on elevating BLE when seated and to use TEDs for edema control.  She was unable to tolerate Keppra due to jitteriness and  Flexeril or Robaxin due to sedative SE.  Back pain has improved with addition skelaxin as patient declined use of hydrocodone or tramadol as she felt that this exacerbated her pain. Back wound is C/D/I and healing well without S/S of infection. Elevated levels of anxiety and bouts of lability have greatly improved with ego support and addition of Paxil.  Vitamin B complex was also added to help with mood stabilization as patient refused to continue Paxil at discharge. Pre-admission labs showed hypokalemia which has improved with supplementation. She continues on Kdur bid and recommend follow up labs in a couple of weeks to monitor K+ and further work up as indicated.  Serial CBC showed that ABLA is slowly improving. Foley was discontinued once bowel program initiated with good results. Flomax was used briefly and as her bladder function, this was discontinued and she is voiding without signs of retention. Her bowel function has improved with small frequent BMs with po meds. She has made gains during her stay and currently requires CGA to supervision at wheelchair level. She will continue to receive follow up HHPT, HHOT [facility]   .  Rehab course: During patient's stay in rehab weekly team conferences were held to monitor patient's progress, set goals and discuss barriers to discharge. At admission, patient required max assist with ADL tasks and total assist with mobility. She  has had improvement in activity tolerance, balance, postural control as well as ability to compensate for deficits.  She is able to perform SB transfes with CGA to stabilize board and is able to use leg loops to manipulate BLE. She requires supervision with lift for squat pivot transfers. She is able to propel her wheelchair for 150' x 2 at modified independent level. Family education was completed with sister and daughter.   Disposition: Home  Diet: Regular.   Special Instructions: 1. Continue to elevate BLE when seated and wear TEDs during the day. 2. Lovenox to continue thorough 01/05/21. 3. Recheck BMET in a couple of weeks to monitor potassium levels.   Allergies as of 11/07/2020      Reactions   Celebrex [celecoxib]    Caused pain   Lisinopril Swelling   Facial swelling   Meloxicam Swelling   Facial swelling   Metronidazole    Unknown reaction   Nabumetone    Unknown  reaction   Nebivolol    Unknown reaction   Tramadol    Caused pain       Medication List    STOP taking these medications   amLODipine 10 MG tablet Commonly known as: NORVASC   Diclofenac Sodium CR 100 MG 24 hr tablet   Excedrin Extra Strength 250-250-65 MG tablet Generic drug: aspirin-acetaminophen-caffeine     TAKE these medications   acetaminophen 325 MG tablet Commonly known as: TYLENOL Take 1-2 tablets (325-650 mg total) by mouth every 4 (four) hours as needed for mild pain.   albuterol 108 (90 Base) MCG/ACT inhaler Commonly known as: VENTOLIN HFA Inhale 2 puffs into the lungs every 6 (six) hours as needed for wheezing or shortness of breath.   B-complex with vitamin C tablet Take 1 tablet  by mouth daily.   budesonide-formoterol 160-4.5 MCG/ACT inhaler Commonly known as: SYMBICORT Inhale 2 puffs into the lungs 2 (two) times daily.   cetirizine 10 MG tablet Commonly known as: ZYRTEC Take 10 mg by mouth daily.   enoxaparin 40 MG/0.4ML injection Commonly known as: LOVENOX Inject 0.4 mLs (40 mg total) into the skin daily. Dispense #30 --40 mg syringes   famotidine 20 MG tablet Commonly known as: PEPCID Take 20 mg by mouth 2 (two) times daily.   fluticasone 50 MCG/ACT nasal spray Commonly known as: FLONASE Place 2 sprays into both nostrils daily.   gabapentin 300 MG capsule Commonly known as: NEURONTIN Take 1 capsule (300 mg total) by mouth at bedtime. What changed:   how much to take  when to take this   metaxalone 800 MG tablet Commonly known as: SKELAXIN Take 1 tablet (800 mg total) by mouth 3 (three) times daily as needed for muscle spasms.   polyethylene glycol 17 g packet Commonly known as: MIRALAX / GLYCOLAX Take 17 g by mouth daily.   potassium chloride SA 20 MEQ tablet Commonly known as: KLOR-CON Take 40 mEq by mouth 2 (two) times daily.   zolpidem 5 MG tablet Commonly known as: AMBIEN Take 5 mg by mouth at bedtime.       Follow-up Information    Lovorn, Aundra Millet, MD Follow up.   Specialty: Physical Medicine and Rehabilitation Why: office will call you with follow up appointment Contact information: [address] [place] [address] [phone]        Paulino Door, MD. Call.   Specialty: Family Medicine Why: for post hospital follow up Contact information: [address] [place] [address] [phone]        Lisbeth Renshaw, MD Follow up.   Specialty: Neurosurgery Contact information: [address] [place] [address] [phone]               Signed: Jacquelynn Cree 11/10/2020, 10:03 AM

## 2020-11-11 ENCOUNTER — Telehealth: Payer: Self-pay | Admitting: Registered Nurse

## 2020-11-11 NOTE — Telephone Encounter (Signed)
Transitional Care call  Patient name: Mary Davila DOB: 1956/04/08 1. Are you/is patient experiencing any problems since coming home? No a. Are there any questions regarding any aspect of care? No 2. Are there any questions regarding medications administration/dosing? No a. Are meds being taken as prescribed? Yes b. "Patient should review meds with caller to confirm" Medication List Reviewed 3. Have there been any falls? No 4. Has Home Health been to the house and/or have they contacted you? Yes: [facility] a. If not, have you tried to contact them? NA b. Can we help you contact them? NA 5. Are bowels and bladder emptying properly? Yes a. Are there any unexpected incontinence issues? No b. If applicable, is patient following bowel/bladder programs? NA 6. Any fevers, problems with breathing, unexpected pain? No 7. Are there any skin problems or new areas of breakdown? No 8. Has the patient/family member arranged specialty MD follow up (ie cardiology/neurology/renal/surgical/etc.)?  [name] was instructed to call Dr. Conchita Paris to schedule HFU appointment she verbalizes understanding.  a. Can we help arrange? No 9. Does the patient need any other services or support that we can help arrange? No 10. Are caregivers following through as expected in assisting the patient? Yes 11. Has the patient quit smoking, drinking alcohol, or using drugs as recommended? (                        ) [name] requested to see Dr Berline Chough, she is coming from [place]. Her appointment was changed to Dr Berline Chough  Appointment date/time 12/08/2020  arrival time 10:45 for 11:00 appointment with Dr Berline Chough. At [address] [facility] [address]

## 2020-11-12 ENCOUNTER — Telehealth (HOSPITAL_COMMUNITY): Payer: Self-pay

## 2020-11-12 ENCOUNTER — Telehealth: Payer: Self-pay

## 2020-11-12 NOTE — Telephone Encounter (Signed)
Pharmacy Transitions of Care Follow-up Telephone Call  Date of discharge: 11/07/20 Discharge Diagnosis: incomplete paraplegia  How have you been since you were released from the hospital? Patient has been doing well, knows s/sx of bleeding to watch out for  Medication changes made at discharge: yes  Medication changes obtained and verified? yes    Medication Accessibility:  Home Pharmacy: not discussed, patient doesn't have refills but may need extra doses to get her to first follow up. Instructed to call Dr. Dahlia Client office when she starts to run low to make sure she doesn't;t need additional doses before 12/08/20  Medication Review:  Enoxaparin 40mg  once daily started on 11/07/20 - Discussed importance of taking medication around the same time everyday  - Advised patient of medications to avoid (NSAIDs, ASA)  - Educated that Tylenol (acetaminophen) will be the preferred analgesic to prevent risk of bleeding  - Emphasized importance of monitoring for signs and symptoms of bleeding (abnormal bruising, prolonged bleeding, nose bleeds, bleeding from gums, discolored urine, black tarry stools)  - Advised patient to alert all providers of anticoagulation therapy prior to starting a new medication or having a procedure    Follow-up Appointments:  Patient has follow up with Dr. 11/09/20 in Phys Medicine and Dr. Birdie Riddle in [facility] on 12/10/20  If their condition worsens, is the pt aware to call PCP or go to the Emergency Dept.? yes  Final Patient Assessment: Patient doing well. Advised to call MD office if she starts to run low on lovenox before her first follow up. Has follow ups scheduled.

## 2020-11-12 NOTE — Telephone Encounter (Signed)
Arlys John, PT from [facility] called requesting verbal orders for HHPT 1wk1, 2wk4, 1wk4. Orders approved and given.

## 2020-11-17 ENCOUNTER — Telehealth: Payer: Self-pay | Admitting: *Deleted

## 2020-11-17 NOTE — Telephone Encounter (Signed)
Mary Davila PT [facility] called to report elevated blood pressures for Mary Davila  Resting R 170/92  L 172/102.  Her appt with Dr Berline Chough is not until 12/08/20.

## 2020-11-18 NOTE — Telephone Encounter (Signed)
I have let Mary Davila @ [facility] know and they will reach out to the pt and the primary care.

## 2020-11-18 NOTE — Telephone Encounter (Signed)
That's much higher than was having in the hospital.  If I remember correctly, she's on Norvasc 10 mg daily- but wouldn't allow me to start anything else in the hospital where she was running 150/s90s.  I strongly suggest having pt see her PCP ASAP to work on her BP, since she's home now- Thank you, ML

## 2020-11-19 ENCOUNTER — Encounter: Payer: Medicare HMO | Admitting: Registered Nurse

## 2020-11-19 NOTE — Telephone Encounter (Signed)
[facility] called : Patient blood pressure today seated was 166/96 &164/92.   Per note on yesterday [facility] advised to call [name] PCP.  Call back phone [phone].

## 2020-12-08 ENCOUNTER — Encounter: Payer: Medicare HMO | Attending: Registered Nurse | Admitting: Physical Medicine and Rehabilitation

## 2020-12-08 ENCOUNTER — Other Ambulatory Visit: Payer: Self-pay

## 2020-12-08 ENCOUNTER — Encounter: Payer: Self-pay | Admitting: Physical Medicine and Rehabilitation

## 2020-12-08 VITALS — BP 143/81 | HR 84 | Temp 98.2°F | Ht 65.5 in

## 2020-12-08 DIAGNOSIS — R609 Edema, unspecified: Secondary | ICD-10-CM | POA: Diagnosis not present

## 2020-12-08 DIAGNOSIS — K592 Neurogenic bowel, not elsewhere classified: Secondary | ICD-10-CM | POA: Diagnosis not present

## 2020-12-08 DIAGNOSIS — N319 Neuromuscular dysfunction of bladder, unspecified: Secondary | ICD-10-CM | POA: Insufficient documentation

## 2020-12-08 DIAGNOSIS — G8222 Paraplegia, incomplete: Secondary | ICD-10-CM | POA: Diagnosis not present

## 2020-12-08 DIAGNOSIS — S24109S Unspecified injury at unspecified level of thoracic spinal cord, sequela: Secondary | ICD-10-CM | POA: Insufficient documentation

## 2020-12-08 DIAGNOSIS — M21371 Foot drop, right foot: Secondary | ICD-10-CM | POA: Diagnosis present

## 2020-12-08 DIAGNOSIS — M21372 Foot drop, left foot: Secondary | ICD-10-CM | POA: Diagnosis present

## 2020-12-08 NOTE — Progress Notes (Signed)
Subjective:    Patient ID: Mary Davila, female    DOB: 04-24-1956, 65 y.o.   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 Also has a Hx of HTN, on Norvasc- COPD, on Dulera, and voiding off Flomax, and Neurogenic bowel - here for hospital f/u.   Still using a W/C at home- using loaner w/c today from here.   Using loaner at home as well- waiting for w/c from  [employer] company-  To get here 4/15.   Pain- still there- doing a lot of twisting at home- in hospital bed- very uncomfortable- hyrts every day.  Takes a little tylenol- 1x/week.  Tylenol not helping- and due to twisting in the bed, so tylenol not real effective.  Not really using rails much, but her bed was too tall.   Needs a firm mattress and hospital bed mattress too soft. Has a firm mattress already for her bed.  No pressure ulcers-  R buttocks - and R privates is swollen as well as R thigh,  Still numb-  underneath feet, feels like standing on sponges. Also  has nerve sensations and pain- tingling.     Keppra caused her to be in daze- and also doesn't want to try any other meds for pain.    R buttocks in bruises/black; also L side is bruised/black, but not as swollen.   Doesn't hurt is really numb,  In privates. Can tell when wipes on L labia- not R labia.   Voiding OK-  No double voiding- no incontinence.   Bowels OK- has to take miralax  1-2x/day and stool softener- Colace 1/day.  Meat also stopped her up.    Therapy- working on standing and taking steps with walker- 24 steps so far at max.  Can stand 2x/day at sink- ~ 5 minutes- to do breakfast dishes.   Still doing Lovenox shots- but doing 1 week on; 1 week off.  Abdomen was hurting and was hard to find a place to stick. Pt does her own injections.   Left PRAFOs- at hospital-  Called back and they had cleaned other-  Can't purchase them Can raise R foot now-  which is better, but  Didn't go home with either of them. But also wasn't using them in hospital.  Is propping feet with pillow At night-    Was told by therapy to not use AFOs so far.     Pain Inventory Average Pain 3 Pain Right Now 3 My pain is intermittent, dull and aching  LOCATION OF PAIN back  BOWEL Number of stools per week: daily Oral laxative use Yes  Type of laxative miralax Enema or suppository use No  History of colostomy No  Incontinent No   BLADDER Normal In and out cath, frequency na Able to self cath na Bladder incontinence na Frequent urination na Leakage with coughing na Difficulty starting stream na Incomplete bladder emptying na   Mobility use a wheelchair needs help with transfers  Function disabled: date disabled .  Neuro/Psych weakness  Prior Studies Any changes since last visit?  no  Physicians involved in your care Any changes since last visit?  no   Family History  Problem Relation Age of Onset  . Stroke Mother   . Diabetes Mother   . Bone cancer Brother    Social History   Socioeconomic History  . Marital status: Divorced  Spouse name: Not on file  . Number of children: Not on file  . Years of education: Not on file  . Highest education level: Not on file  Occupational History  . Not on file  Tobacco Use  . Smoking status: Never Smoker  . Smokeless tobacco: Never Used  Vaping Use  . Vaping Use: Never used  Substance and Sexual Activity  . Alcohol use: Not on file  . Drug use: Not on file  . Sexual activity: Not Currently  Other Topics Concern  . Not on file  Social History Narrative   ** Merged History Encounter **       Social Determinants of Health   Financial Resource Strain: Not on file  Food Insecurity: Not on file  Transportation Needs: Not on file  Physical Activity: Not on file  Stress: Not on file  Social Connections: Not on file   Past Surgical History:  Procedure Laterality Date  .  ABDOMINAL HYSTERECTOMY    . BACK SURGERY    . EYE SURGERY    . JOINT REPLACEMENT Bilateral    bilateral hip replaced   Past Medical History:  Diagnosis Date  . Arthritis   . Asthma   . Constipation   . Hypertension   . Nocturia    BP (!) 143/81   Pulse 84   Temp 98.2 F (36.8 C)   Ht 5' 5.5" (1.664 m) Comment: reported  SpO2 99%   BMI 24.28 kg/m   Opioid Risk Score:   Fall Risk Score:  `1  Depression screen PHQ 2/9  Depression screen PHQ 2/9 12/08/2020  Decreased Interest 0  Down, Depressed, Hopeless 0  PHQ - 2 Score 0  Altered sleeping 1  Tired, decreased energy 1  Change in appetite 0  Feeling bad or failure about yourself  0  Trouble concentrating 0  Moving slowly or fidgety/restless 0  Suicidal thoughts 0  PHQ-9 Score 2    Review of Systems  Constitutional: Negative.   HENT: Negative.   Eyes: Negative.   Respiratory: Negative.   Cardiovascular: Negative.   Gastrointestinal: Negative.   Endocrine: Negative.   Genitourinary: Negative.   Musculoskeletal: Positive for back pain and gait problem.  Skin: Negative.   Allergic/Immunologic: Negative.   Neurological: Positive for weakness.  Hematological: Bruises/bleeds easily.       Lovenox  Psychiatric/Behavioral: Negative.   All other systems reviewed and are negative.      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 Also has a Hx of HTN, on  Norvasc- COPD, on Dulera, and voiding off Flomax, and Neurogenic bowel - here for hospital f/u.  No spasticity  1. Sticker on hospital bed- so has phone number to  call and send it back.   2. Needs get PRAFOs- to keep ankles at 90 degrees when  sleeps- need to wear. Will order from [employer]- will give [employer].   3. Pain-  Con't on tylenol prn for pain- doesn't want other meds.   4. Increased risk of DVT- Con't Lovenox for another 2 weeks, then stop-  5. LE- edema- Needs to wear TEDs when possible- for LE edema.   6.W/C usage-  Waiting on W/C- will get from [employer] in April 2022.   7.Neurogenic bowel -  Con't Miralax/laxatives.   8. Hand pain- from W/C- Can use W/C gloves- usually can get from [employer]-  To help with hand pain from pushing w/c.   9. F/U in 3 months-   I spent a total  Of 35 minutes on visit-as detailed above- specifically spent time on PRAFOs, lovenox and hand pain.with education

## 2020-12-08 NOTE — Patient Instructions (Signed)
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 Also has a Hx of HTN, on Norvasc- COPD, on Dulera, and voiding off Flomax, and Neurogenic bowel - here for hospital f/u.  No spasticity  1. Sticker on hospital bed- so has phone number to  call and send it back.   2. Needs get PRAFOs- to keep ankles at 90 degrees when sleeps- need to wear. Will order from [employer]- will give [employer].   3. Pain-  Con't on tylenol prn for pain- doesn't want other meds.   4. Increased risk of DVT- Con't Lovenox for another 2 weeks, then stop-  5. LE- edema- Needs to wear TEDs when possible- for LE edema.   6.W/C usage-  Waiting on W/C- will get from [employer] in April 2022.   7.Neurogenic bowel -  Con't Miralax/laxatives.   8. Hand pain- from W/C- Can use W/C gloves- usually can get from [employer]-  To help with hand pain from pushing w/c.   9. F/U in 2 months-

## 2021-01-05 ENCOUNTER — Telehealth: Payer: Self-pay | Admitting: *Deleted

## 2021-01-05 NOTE — Telephone Encounter (Signed)
Chris PT from [facility] called for POC 1 wk 8.  Approval given.

## 2021-02-06 ENCOUNTER — Telehealth: Payer: Self-pay

## 2021-02-06 NOTE — Telephone Encounter (Signed)
Robbin, please change her number in the computer- it's not right.   Explained to pt if goes up on w/c size- cannot get into bathroom.  Also needs brakes to work- they aren't working well.  Pt says w/c sides push against her hips, and S/P THRs and it hurts.   So will put arms up when sits in w/c. And doesn't want increase in size.  Also, will call to ask about brakes.  Did text Apolinar Junes to let him know.  Also, went over options with Elnita Maxwell about  size of w/c and she agreed to keep at same size- and put arm rests up when sitting in w/c. So hips don't hurt

## 2021-02-06 NOTE — Telephone Encounter (Signed)
Mary Davila called: Her wheelchair is too small. [employer] came out twice with adjustments. And she is still having problems. She can not get anyone on the phone. Patient was in tears.   She would like Dr. Berline Chough to call [employer].   I called the [employer] [phone] phone number for help. To see how to satisfy Mary Davila.   Irving Burton stated she will get with [employer] salesperson to work with [employer].   Kittie has been informed.

## 2021-02-18 ENCOUNTER — Telehealth: Payer: Self-pay

## 2021-02-18 NOTE — Telephone Encounter (Signed)
Mary Davila the Physical Therapist called:   [name] blood pressure today was 158/88, Heart rate 58. She was also recently started on Rx Tizanidine ([facility]). Mary Davila PT thinks the new Rx maybe the cause.   Please advise. Thank you.   Call back phone [phone]

## 2021-02-23 NOTE — Telephone Encounter (Signed)
Slightly elevated BP is NOT due to Zanaflex- Zanaflex causes LOWER BP- it also doesn't affect HR at all- it's wel documented to cause LOW BP! Please don't stop the medicine- please let pt know, ML

## 2021-02-25 NOTE — Telephone Encounter (Signed)
I have notified Consuella Lose PT. She said it was that the tizanidine came up as an interference with the famitodine, and Dr Berline Chough has said to discontinue the famotidine but continue the tizanidine. I have let Ms Salvino know as well.

## 2021-03-11 ENCOUNTER — Encounter: Payer: Medicare HMO | Attending: Registered Nurse | Admitting: Physical Medicine and Rehabilitation

## 2021-03-11 ENCOUNTER — Other Ambulatory Visit: Payer: Self-pay

## 2021-03-11 ENCOUNTER — Encounter: Payer: Self-pay | Admitting: Physical Medicine and Rehabilitation

## 2021-03-11 VITALS — BP 137/81 | HR 77 | Temp 98.3°F | Ht 65.5 in

## 2021-03-11 DIAGNOSIS — Z993 Dependence on wheelchair: Secondary | ICD-10-CM | POA: Diagnosis present

## 2021-03-11 DIAGNOSIS — M21371 Foot drop, right foot: Secondary | ICD-10-CM

## 2021-03-11 DIAGNOSIS — M21372 Foot drop, left foot: Secondary | ICD-10-CM | POA: Diagnosis present

## 2021-03-11 DIAGNOSIS — G8222 Paraplegia, incomplete: Secondary | ICD-10-CM

## 2021-03-11 DIAGNOSIS — R252 Cramp and spasm: Secondary | ICD-10-CM | POA: Diagnosis not present

## 2021-03-11 DIAGNOSIS — K219 Gastro-esophageal reflux disease without esophagitis: Secondary | ICD-10-CM

## 2021-03-11 MED ORDER — LANSOPRAZOLE 30 MG PO CPDR: 30.0000 mg | DELAYED_RELEASE_CAPSULE | Freq: Every day | ORAL | 5 refills | Status: AC

## 2021-03-11 MED ORDER — TIZANIDINE HCL 4 MG PO TABS: 2.0000 mg | ORAL_TABLET | Freq: Three times a day (TID) | ORAL | 5 refills | Status: AC

## 2021-03-11 NOTE — Progress Notes (Signed)
Subjective:    Patient ID: Mary Davila, female    DOB: 1956/06/03, 65 y.o.   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 Also has a Hx of HTN, on Norvasc- COPD, on Dulera, and voiding off Flomax, and Neurogenic bowel -  No spasticity Here for f/u on SCI.   Off Lovenox shots Swelling  better- not wearing TEDs.  Wearing some compression socks/footies- wear daily-   R side is still numb- when going to have BM , cannot squeeze BM out.  When gets constipated- takes 3 days, then can finally get it out.   Prune juice helps more than Suppository- uses after each meal.  Takes 3 days to go every time.  Taking Senokot-S 1 tab/day- still makes her go q3 days.  But takes 1 hour to 90 minutes -  Not getting hemorrhoids.    Pain- thinks pain in back from straining to go to bathroom- feet still has nerve pain- L leg really bad.    Stays cold all the time- was told due to neuropathy-  Also feet that feel so heavy.   Looks like has more control of legs, not feet.   Sometimes legs just go straight out- describes extensor tone-  On Zanaflex again- helps Helping some- takes 2x/day- by 3pm, too sleepy.   Was told not to take with Pepcid.  Stopped Robaxin- wasn't helping.   Has her new manual w/c- from [employer] Doesn't fit in bathroom And it's too tight-  Has a big wheel and small wheel on w/c.  The casters  Getting up q2 hours to pee-    Pain Inventory Average Pain 4 Pain Right Now 6 My pain is tingling  LOCATION OF PAIN  back  BOWEL Number of stools per week: 2 Oral laxative use Yes  Type of laxative miralax   BLADDER Normal    Mobility do you drive?  no use a wheelchair transfers alone  Function disabled: date disabled .  Neuro/Psych spasms  Prior Studies Any changes since last visit?  no  Physicians involved in your care Any changes since last visit?   no   Family History  Problem Relation Age of Onset   Stroke Mother    Diabetes Mother    Bone cancer Brother    Social History   Socioeconomic History   Marital status: Divorced    Spouse name: Not on file   Number of children: Not on file   Years of education: Not on file   Highest education level: Not on file  Occupational History   Not on file  Tobacco Use   Smoking status: Never   Smokeless tobacco: Never  Vaping Use   Vaping Use: Never used  Substance and Sexual Activity   Alcohol use: Not on file   Drug use: Not on file   Sexual activity: Not Currently  Other Topics Concern   Not on file  Social History Narrative   ** Merged History Encounter **       Social Determinants of Health   Financial Resource Strain: Not on file  Food Insecurity: Not on file  Transportation Needs: Not on file  Physical Activity: Not on file  Stress: Not on file  Social Connections: Not on file   Past Surgical History:  Procedure Laterality Date   ABDOMINAL HYSTERECTOMY     BACK SURGERY     EYE SURGERY  JOINT REPLACEMENT Bilateral    bilateral hip replaced   Past Medical History:  Diagnosis Date   Arthritis    Asthma    Constipation    Hypertension    Nocturia    BP 137/81   Pulse 77   Temp 98.3 F (36.8 C)   Ht 5' 5.5" (1.664 m)   SpO2 98%   BMI 24.28 kg/m   Wheel chair weight :40.4 lbs Opioid Risk Score:   Fall Risk Score:  `1  Depression screen PHQ 2/9  Depression screen [facility] 2/9 03/11/2021 12/08/2020  Decreased Interest 0 0  Down, Depressed, Hopeless 0 0  PHQ - 2 Score 0 0  Altered sleeping - 1  Tired, decreased energy - 1  Change in appetite - 0  Feeling bad or failure about yourself  - 0  Trouble concentrating - 0  Moving slowly or fidgety/restless - 0  Suicidal thoughts - 0  PHQ-9 Score - 2     Review of Systems  Constitutional: Negative.   HENT: Negative.    Eyes: Negative.   Respiratory: Negative.    Cardiovascular: Negative.    Gastrointestinal: Negative.   Endocrine: Negative.   Genitourinary: Negative.   Musculoskeletal:  Positive for gait problem.  Skin: Negative.   Allergic/Immunologic: Negative.   Hematological: Negative.   Psychiatric/Behavioral: Negative.    All other systems reviewed and are negative.     Objective:   Physical Exam Awake, alert, appropriate,  Sitting on table; w/c in room, NAD MS: HF 4/5 on R 4+/5 on L; KE 4+/5 on R; 4/5 on L; KF 2-/5 B/L; DF 2-/5 on R; 0/5 on L; PF- 1/5 on R and 0/5 on L Neuro: some extensor tone seen, but not increased tone or spasms or clonus seen on exam B/L  Decreased sensation on feet B/L and RLE and bottom- on R side. Pain /temp and light touch impaired.    Casters different sizes on w/c- w/c measured at 18+ inches.  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 Also has a Hx of HTN, on Norvasc- COPD, on Dulera, and voiding off Flomax, and Neurogenic bowel - No spasticity  1 Can increase Colace plus laxative- to 2 tabs/day.   2. Let me know how it works- and if not much better, we can try something else.   3. Will still get some improvement- of some kind in the next 6 months- after that- can still learn new skills after that, but usually no more strength, sensation improves after that.   4. Can reduce Zanaflex/tizanidine to 2 mg 2x/day- can take 1 more 2 mg dose if needs it. Can actually take 4 mg at nightt 2 mg during day. Sent in refills for it- 2 mg in AM; and 2 mg in afternoon; and 4 mg nightly.    5. Called [employer]- to fix caster issue and make frame wider- needs crossbrace- they will order crossbar- will take around 2 weeks.    6. I am on vacation 7/2 to 7/9  7. Prevacid 30 mg daily- for reflux Sx's, since cannot take Pepcid- is same medicine as OTC, but it's cheaper than OTC.   8. Asking about nerve pain and sensation in feet-  Have  tried a couple of nerve pain meds- didn't respond well- so no real nerve pain meds to try-   9. Getting AFOs- had to  [facility]- will make it way easier to walk!  10. F/U in 3 months   I spent a total of 30 minutes on visit- spent on discussing AFOs; pain-

## 2021-03-11 NOTE — Patient Instructions (Signed)
  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 Also has a Hx of HTN, on Norvasc- COPD, on Dulera, and voiding off Flomax, and Neurogenic bowel - No spasticity  1 Can increase Colace plus laxative- to 2 tabs/day.   2. Let me know how it works- and if not much better, we can try something else.   3. Will still get some improvement- of some kind in the next 6 months- after that- can still learn new skills after that, but usually no more strength, sensation improves after that.   4. Can reduce Zanaflex/tizanidine to 2 mg 2x/day- can take 1 more 2 mg dose if needs it. Can actually take 4 mg at nightt 2 mg during day.   5. Called [employer]- to fix caster issue and make frame wider- needs crossbrace- they will order crossbar- will take around 2 weeks.    6. I am on vacaiton 7/2 to 7/9  7. Prevacid 30 mg daily- for reflux Sx's, since cannot take Pepcid- is same medicine as OTC, but it's cheaper than OTC.   8. Asking about nerve pain and sensation in feet-  Have tried a couple of nerve pain meds- didn't respond well- so no real nerve pain meds to try-   9. Getting AFOs- had to [facility] will make it way easier to walk!  10. F/U in 3 months

## 2021-03-16 ENCOUNTER — Telehealth: Payer: Self-pay | Admitting: *Deleted

## 2021-03-16 NOTE — Telephone Encounter (Signed)
Ms Mannina has not been able to get in touch with the wheel chair company and the nuts are coming off her wheelchair.  Please call her.

## 2021-05-05 ENCOUNTER — Telehealth: Payer: Self-pay | Admitting: Physical Medicine and Rehabilitation

## 2021-05-05 NOTE — Telephone Encounter (Signed)
I have called [employer] in reference to message that they left on clinic line about getting more visits for patient authorized.  We normally don't get those in our office, I left voicemail at [phone] option 1 that they need to call [facility], they normally get the visits auth'd.

## 2021-06-17 ENCOUNTER — Encounter: Payer: Medicare HMO | Attending: Physical Medicine and Rehabilitation | Admitting: Physical Medicine and Rehabilitation

## 2022-01-11 ENCOUNTER — Telehealth: Payer: Self-pay

## 2022-01-11 NOTE — Telephone Encounter (Signed)
Mary Davila called to report she is having problems with her wheelchair. "The screws keep falling out and now the arm rest has zip-ties to hold it in place.'' She wanted to speak to Dr. Berline Chough about the problem.  ? ?Patient has been advised to call the supplier of the wheelchair. To report the issue.  ? ?Call back phone [phone]. ? ? ?

## 2022-01-15 NOTE — Telephone Encounter (Signed)
Have called [employer]- they said they will schedule someone to go out to see her/fix w/c- expect to hear from them- if hasn't in 1 week, clal me- also, need a f/u- haven't seen her since 6/22- thanks- ML
